# Patient Record
Sex: Female | Born: 1950 | Race: Black or African American | Hispanic: No | Marital: Single | Smoking: Never smoker
Health system: Southern US, Community
[De-identification: ages and names within clinical notes are randomized; demographics above are authoritative.]

## PROBLEM LIST (undated history)

## (undated) DIAGNOSIS — I10 Essential (primary) hypertension: Secondary | ICD-10-CM

## (undated) DIAGNOSIS — C50919 Malignant neoplasm of unspecified site of unspecified female breast: Secondary | ICD-10-CM

## (undated) HISTORY — PX: TONSILLECTOMY: SUR1361

## (undated) HISTORY — DX: Malignant neoplasm of unspecified site of unspecified female breast: C50.919

---

## 2007-02-13 ENCOUNTER — Emergency Department (HOSPITAL_COMMUNITY): Admission: EM | Admit: 2007-02-13 | Discharge: 2007-02-13 | Payer: Self-pay | Admitting: Emergency Medicine

## 2015-03-05 DIAGNOSIS — H6090 Unspecified otitis externa, unspecified ear: Secondary | ICD-10-CM | POA: Diagnosis not present

## 2015-03-05 DIAGNOSIS — Z1239 Encounter for other screening for malignant neoplasm of breast: Secondary | ICD-10-CM | POA: Diagnosis not present

## 2015-03-05 DIAGNOSIS — J069 Acute upper respiratory infection, unspecified: Secondary | ICD-10-CM | POA: Diagnosis not present

## 2015-03-11 ENCOUNTER — Other Ambulatory Visit: Payer: Self-pay | Admitting: Family Medicine

## 2015-03-11 DIAGNOSIS — Z1231 Encounter for screening mammogram for malignant neoplasm of breast: Secondary | ICD-10-CM

## 2015-03-11 DIAGNOSIS — E2839 Other primary ovarian failure: Secondary | ICD-10-CM

## 2015-03-30 ENCOUNTER — Ambulatory Visit: Payer: Self-pay

## 2015-03-30 ENCOUNTER — Other Ambulatory Visit: Payer: Self-pay

## 2015-04-08 ENCOUNTER — Ambulatory Visit
Admission: RE | Admit: 2015-04-08 | Discharge: 2015-04-08 | Disposition: A | Discharge status: Home / Self Care | Payer: Medicare Other | Source: Ambulatory Visit | Attending: Family Medicine | Admitting: Family Medicine

## 2015-04-08 DIAGNOSIS — Z1231 Encounter for screening mammogram for malignant neoplasm of breast: Secondary | ICD-10-CM

## 2015-04-15 ENCOUNTER — Inpatient Hospital Stay: Admission: RE | Admit: 2015-04-15 | Payer: Self-pay | Source: Ambulatory Visit

## 2018-02-22 ENCOUNTER — Other Ambulatory Visit (HOSPITAL_COMMUNITY): Payer: Self-pay | Admitting: Otolaryngology

## 2018-02-22 DIAGNOSIS — H7291 Unspecified perforation of tympanic membrane, right ear: Secondary | ICD-10-CM

## 2018-03-11 ENCOUNTER — Ambulatory Visit (HOSPITAL_COMMUNITY)
Admission: RE | Admit: 2018-03-11 | Discharge: 2018-03-11 | Disposition: A | Discharge status: Home / Self Care | Payer: Medicare Other | Source: Ambulatory Visit | Attending: Otolaryngology | Admitting: Otolaryngology

## 2018-03-11 DIAGNOSIS — H7291 Unspecified perforation of tympanic membrane, right ear: Secondary | ICD-10-CM | POA: Diagnosis present

## 2018-09-05 ENCOUNTER — Emergency Department (HOSPITAL_BASED_OUTPATIENT_CLINIC_OR_DEPARTMENT_OTHER)
Admission: EM | Admit: 2018-09-05 | Discharge: 2018-09-05 | Disposition: A | Discharge status: Home / Self Care | Payer: Medicare Other | Attending: Emergency Medicine | Admitting: Emergency Medicine

## 2018-09-05 ENCOUNTER — Encounter (HOSPITAL_BASED_OUTPATIENT_CLINIC_OR_DEPARTMENT_OTHER): Payer: Self-pay

## 2018-09-05 ENCOUNTER — Other Ambulatory Visit: Payer: Self-pay

## 2018-09-05 DIAGNOSIS — N611 Abscess of the breast and nipple: Secondary | ICD-10-CM | POA: Diagnosis present

## 2018-09-05 DIAGNOSIS — L02213 Cutaneous abscess of chest wall: Secondary | ICD-10-CM

## 2018-09-05 MED ORDER — SULFAMETHOXAZOLE-TRIMETHOPRIM 800-160 MG PO TABS: 1.0000 | ORAL_TABLET | Freq: Two times a day (BID) | ORAL | 0 refills | Status: AC

## 2018-09-05 MED ORDER — LIDOCAINE-EPINEPHRINE (PF) 2 %-1:200000 IJ SOLN
10.0000 mL | Freq: Once | INTRAMUSCULAR | Status: DC
  Filled 2018-09-05: qty 10

## 2018-09-05 MED ORDER — LIDOCAINE-EPINEPHRINE (PF) 2 %-1:200000 IJ SOLN
INTRAMUSCULAR | Status: AC
  Filled 2018-09-05: qty 10

## 2018-09-05 MED ORDER — IBUPROFEN 400 MG PO TABS
600.0000 mg | ORAL_TABLET | Freq: Once | ORAL | Status: AC
  Administered 2018-09-05: 600 mg via ORAL
  Filled 2018-09-05: qty 1

## 2018-09-05 NOTE — ED Triage Notes (Signed)
Pt states she noticed lump to right breast yesterday-NAD-steady gait

## 2018-09-05 NOTE — ED Provider Notes (Signed)
[facility] EMERGENCY DEPARTMENT Provider Note   CSN: [id number] Arrival date & time: 09/05/18  1847     History   Chief Complaint Chief Complaint  Patient presents with  . Breast Mass    HPI Janet Fowler is a 67 y.o. female.  The history is provided by the patient.  Abscess  Abscess location: left breast. Size:  2.5 inchest Abscess quality: fluctuance, painful and redness   Red streaking: no   Duration:  2 days Progression:  Worsening Pain details:    Quality:  Aching, hot and pressure   Severity:  Moderate Chronicity:  New Relieved by:  Nothing Worsened by:  Nothing Ineffective treatments:  Warm compresses Associated symptoms: no anorexia, no fatigue, no fever, no headaches, no nausea and no vomiting     History reviewed. No pertinent past medical history.  There are no active problems to display for this patient.   Past Surgical History:  Procedure Laterality Date  . TONSILLECTOMY       OB History   None      Home Medications    Prior to Admission medications   Medication Sig Start Date End Date Taking? Authorizing Provider  sulfamethoxazole-trimethoprim (BACTRIM DS,SEPTRA DS) 800-160 MG tablet Take 1 tablet by mouth 2 (two) times daily for 5 days. 09/05/18 09/10/18  Bonnita Hollow, MD    Family History No family history on file.  Social History Social History   Tobacco Use  . Smoking status: Never Smoker  . Smokeless tobacco: Never Used  Substance Use Topics  . Alcohol use: Never    Frequency: Never  . Drug use: Never     Allergies   Patient has no known allergies.   Review of Systems Review of Systems  Constitutional: Negative for fatigue and fever.  Gastrointestinal: Negative for anorexia, nausea and vomiting.  Neurological: Negative for headaches.     Physical Exam Updated Vital Signs BP (!) 150/92 (BP Location: Right Arm)   Pulse (!) 118   Temp 98.7 F (37.1 C) (Oral)   Resp 18   Ht 5\' 2"  (1.575 m)    Wt 93 kg   SpO2 100%   BMI 37.49 kg/m   Physical Exam  Constitutional: She appears well-developed. No distress.  HENT:  Head: Normocephalic and atraumatic.  Eyes: Pupils are equal, round, and reactive to light. Conjunctivae are normal.  Neck: JVD present.  Skin: Capillary refill takes less than 2 seconds.  Right medial breast abscess, 2.5 in. Erythematous, fluctuant     ED Treatments / Results  Labs (all labs ordered are listed, but only abnormal results are displayed) Labs Reviewed - No data to display  EKG None  Radiology No results found.  Procedures .Marland KitchenIncision and Drainage Date/Time: 09/05/2018 7:48 PM Performed by: Bonnita Hollow, MD Authorized by: Merrily Pew, MD   Consent:    Consent obtained:  Verbal   Consent given by:  Patient   Risks discussed:  Bleeding, infection, pain and incomplete drainage   Alternatives discussed:  No treatment Location:    Type:  Abscess   Size:  2.5 in   Location:  Trunk   Trunk location:  Chest Pre-procedure details:    Skin preparation:  Betadine Anesthesia (see MAR for exact dosages):    Anesthesia method:  Local infiltration   Local anesthetic:  Lidocaine 2% WITH epi Procedure type:    Complexity:  Simple Procedure details:    Needle aspiration: no     Incision types:  Stab incision   Scalpel blade:  11   Wound management:  Probed and deloculated   Drainage:  Purulent   Drainage amount:  Moderate   Wound treatment:  Wound left open   Packing materials:  None Post-procedure details:    Patient tolerance of procedure:  Tolerated well, no immediate complications   (including critical care time)  Medications Ordered in ED Medications  lidocaine-EPINEPHrine (XYLOCAINE W/EPI) 2 %-1:200000 (PF) injection (has no administration in time range)  lidocaine-EPINEPHrine (XYLOCAINE W/EPI) 2 %-1:200000 (PF) injection 10 mL (has no administration in time range)  ibuprofen (ADVIL,MOTRIN) tablet 600 mg (600 mg Oral Given  09/05/18 1954)     Initial Impression / Assessment and Plan / ED Course  I have reviewed the triage vital signs and the nursing notes.  Pertinent labs & imaging results that were available during my care of the patient were reviewed by me and considered in my medical decision making (see chart for details).     Patient presenting with simple abscess on right breast. I&D was performed on the sight. Moderate, purulent drainage was expressed. Patient sent home with 5 day course of Bactrim.   Final Clinical Impressions(s) / ED Diagnoses   Final diagnoses:  Cutaneous abscess of chest wall    ED Discharge Orders         Ordered    sulfamethoxazole-trimethoprim (BACTRIM DS,SEPTRA DS) 800-160 MG tablet  2 times daily     09/05/18 1948           Bonnita Hollow, MD 09/05/18 1955    Mesner, Corene Cornea, MD 09/06/18 [id number]    Merrily Pew, MD 09/06/18 [phone]

## 2018-09-05 NOTE — ED Notes (Signed)
md chaperoned for breast exam. Supplies gathered with bedside u/s and placed at bedside.

## 2018-09-05 NOTE — ED Notes (Signed)
ED Provider at bedside. 

## 2018-12-10 IMAGING — CT CT TEMPORAL BONES W/O CM
2 of 4 series · 13 of 40 positions shown, 16 images · non-contrast
Comparison: None.

CLINICAL DATA: Left ear drainage

EXAM:
CT TEMPORAL BONES WITHOUT CONTRAST
TECHNIQUE: Axial and coronal plane CT imaging of the petrous temporal bones was
performed with thin-collimation image reconstruction. No intravenous
contrast was administered. Multiplanar CT image reconstructions were
also generated.

[Series 3: ax soft · axial · 0.33mm/px · z∈[+1733,+1785]mm · 11 of 32 slices shown, 14 images]
[im 3/32  brain]
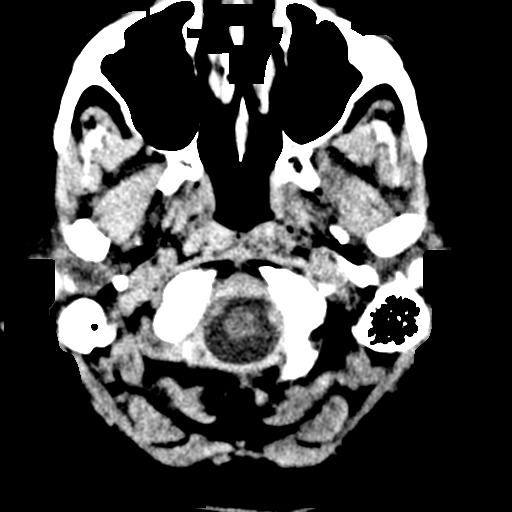
[im 3/32  bone]
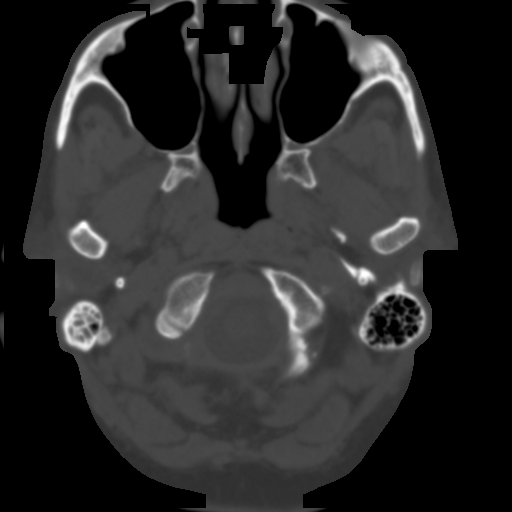
[im 5/32  bone]
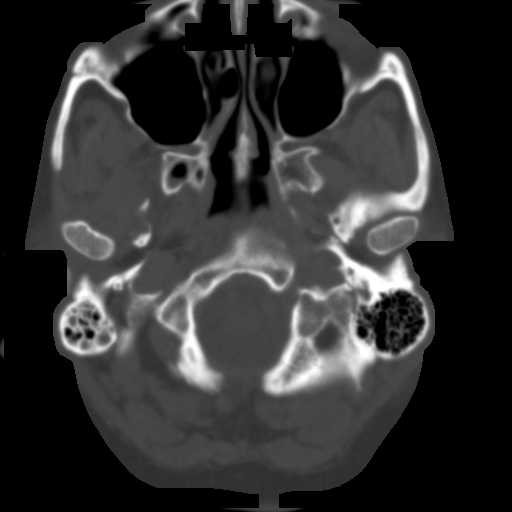
[im 8/32  bone]
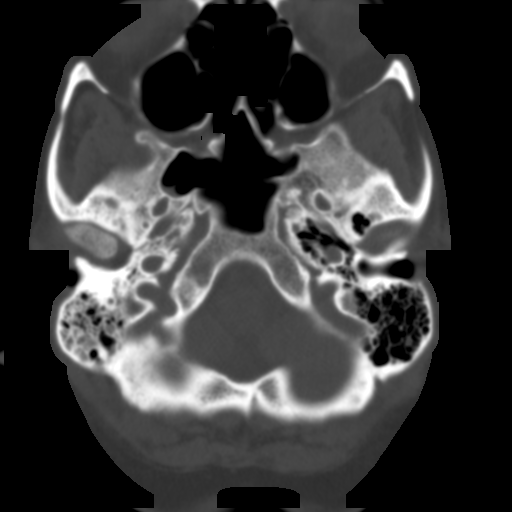
[im 10/32  bone]
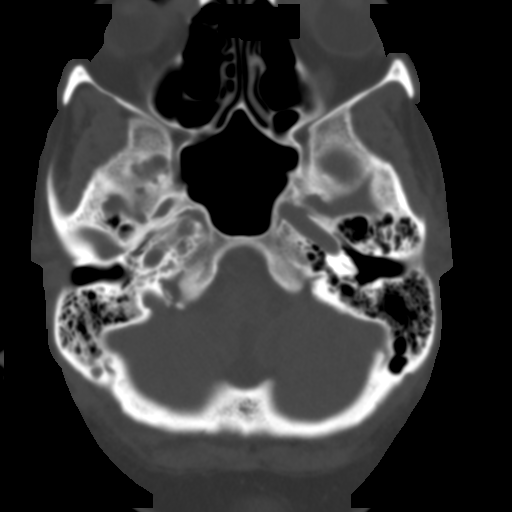
[im 13/32  brain]
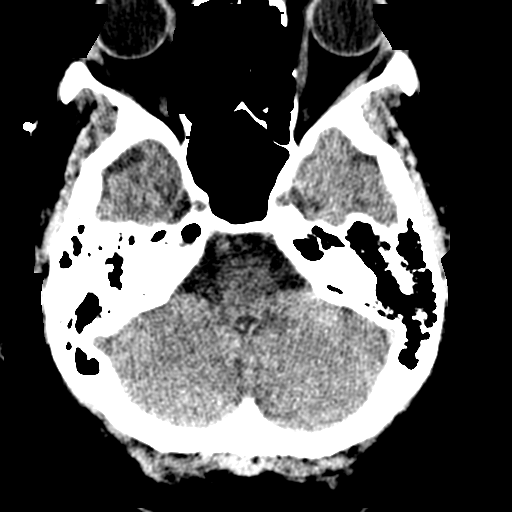
[im 13/32  bone]
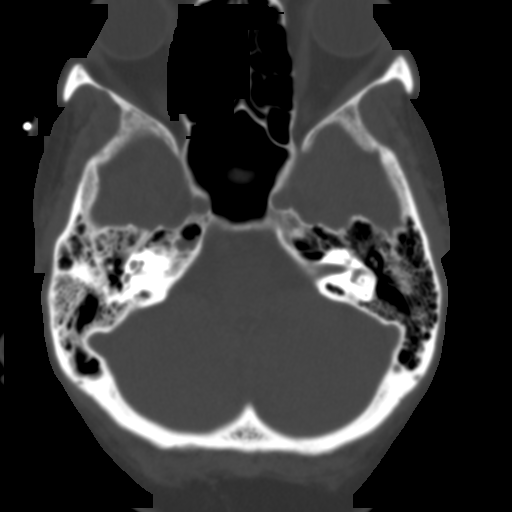
[im 17/32  bone]
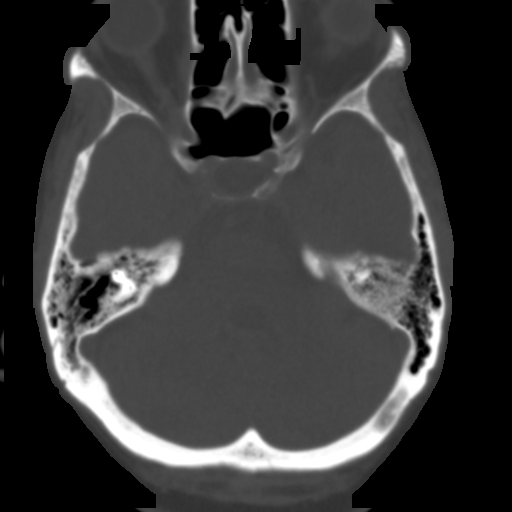
[im 19/32  bone]
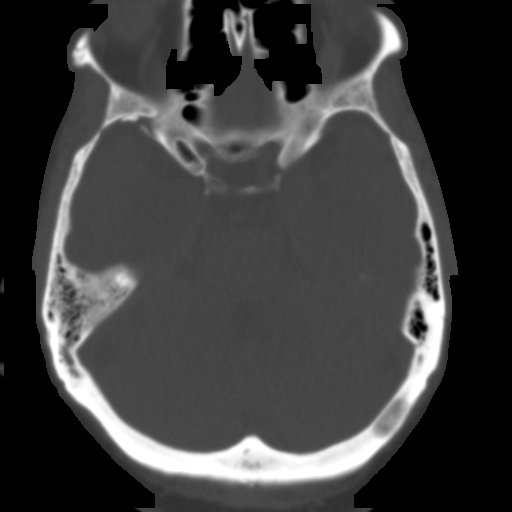
[im 22/32  bone]
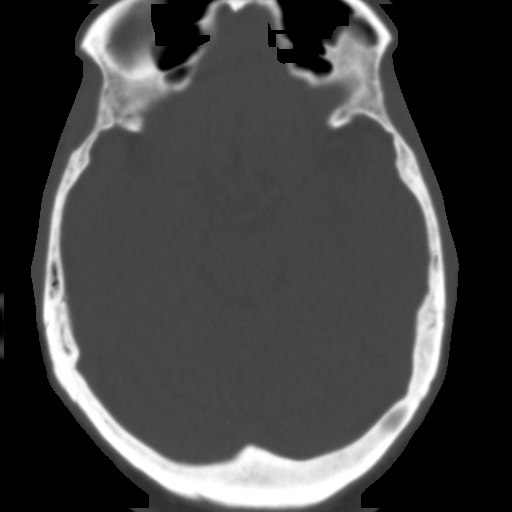
[im 24/32  brain]
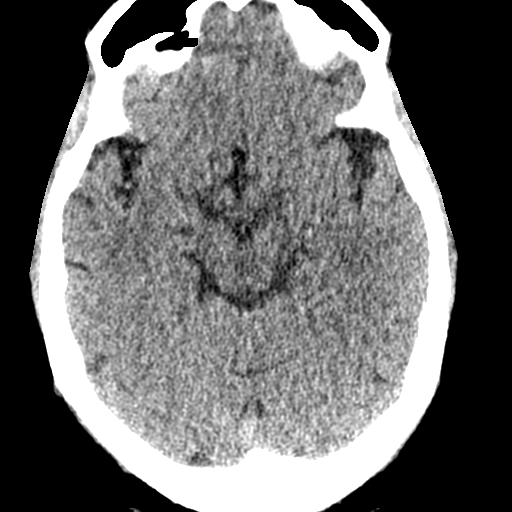
[im 24/32  bone]
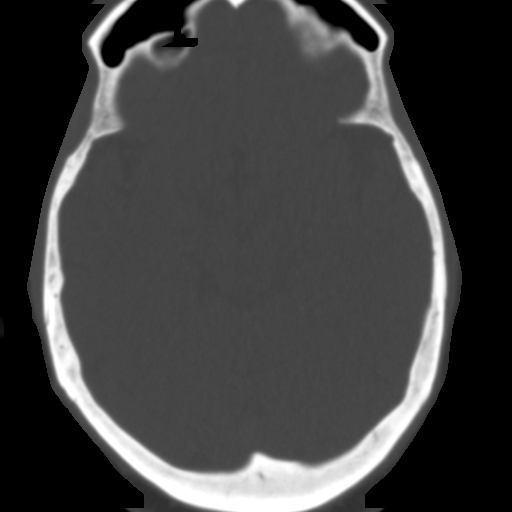
[im 27/32  bone]
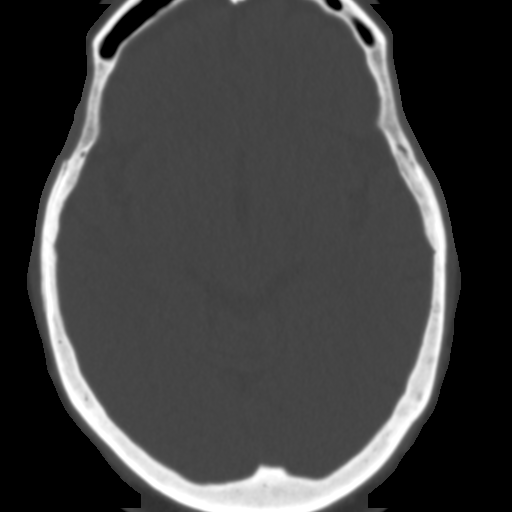
[im 29/32  bone]
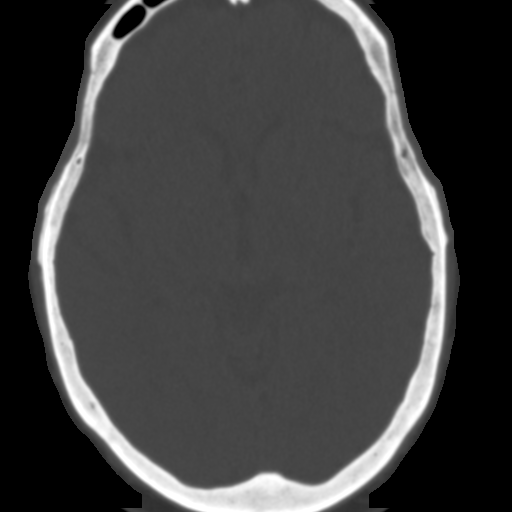

[Series 4: coronal bone. · coronal · 0.15mm/px · 2 of 181 slices shown]
[im 61/181  bone]
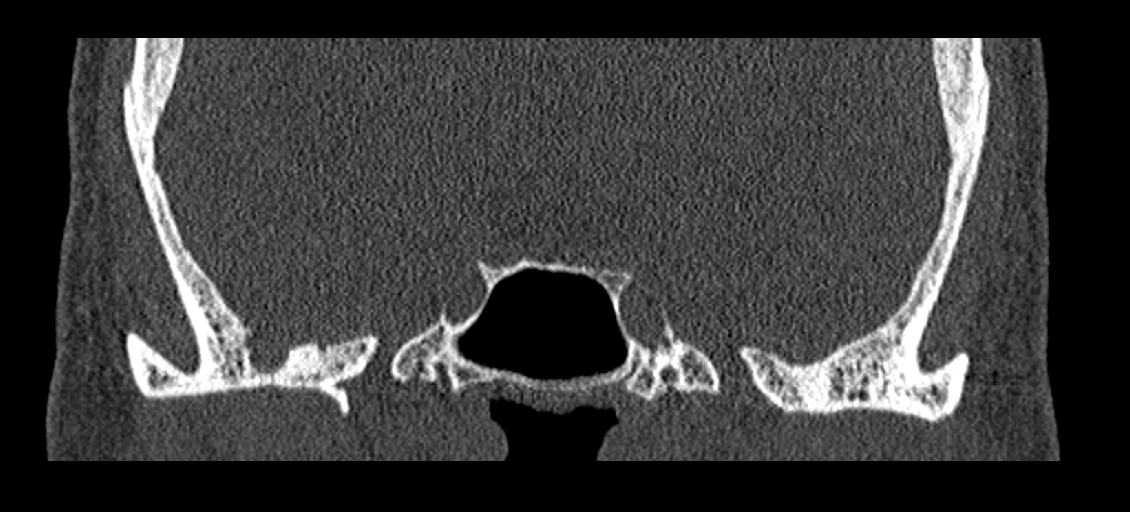
[im 121/181  bone]
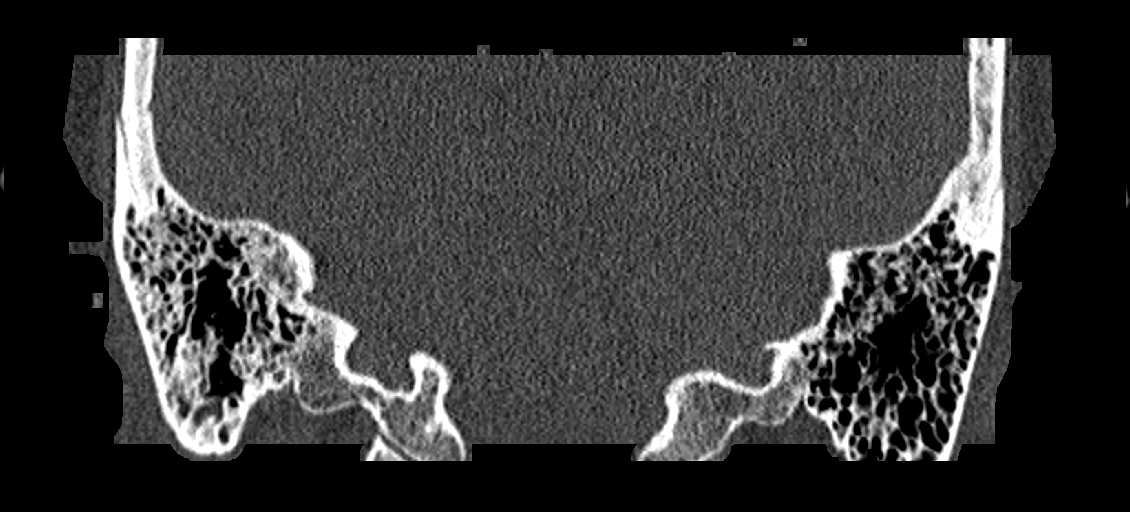

[13 of 40 positions shown; findings below may reference images not displayed]

FINDINGS: RIGHT:

--Pinna and external auditory canal: Normal.

--Ossicular chain: Normal. No erosion or dislocation.

--Tympanic membrane: Mildly thickened.

--Middle ear: Normal.

--Epitympanum: The Prussak space is minimally opacified. The scutum
is sharp. Tegmen tympani is intact.

--Cochlea, vestibule, vestibular aqueduct and semicircular canals:
Normal. No evidence of canal dehiscence or otospongiosis.

--Internal auditory canal: Normal. No widening of the porus
acusticus.

--Facial nerve: No focal abnormality along the course of the facial
nerve.

--Cerebellopontine angle: Normal.

--Petrous Apex: Normal.

--Mastoids: Partial opacification of the right mastoid air cells
without coalescence. Tegmen mastoideum remains intact.

--Carotid canal: Normal position.

LEFT:

--Pinna and external auditory canal: Normal.

--Ossicular chain: Normal. No erosion or dislocation.

--Tympanic membrane: Normal.

--Middle ear: Normal.

--Epitympanum: The Prussak space is clear. The scutum is sharp.
Tegmen tympani is intact.

--Cochlea, vestibule, vestibular aqueduct and semicircular canals:
Normal. No evidence of canal dehiscence or otospongiosis.

--Internal auditory canal: Normal. No widening of the porus
acusticus.

--Facial nerve: No focal abnormality along the course of the facial
nerve.

--Cerebellopontine angle: Normal.

--Petrous Apex: Normal.

--Mastoids: Normal.

--Carotid canal: Normal position.

OTHER:

--Visualized intracranial: Normal.

--Visualized paranasal sinuses: Trace opacification of the right
sphenoid sinus. Otherwise clear.

--Nasopharynx: Clear.

--Temporomandibular joints: Normal.

--Visualized extracranial soft tissues: Normal.
IMPRESSION: 1. Normal appearance of the left temporal bone and the middle ear
and labyrinth the structures.
2. Partial opacification of the right mastoid air cells without
coalescence or dehiscence. Mild thickening of the right tympanic
membrane.

## 2019-08-27 ENCOUNTER — Other Ambulatory Visit: Payer: Self-pay

## 2019-08-27 DIAGNOSIS — Z20822 Contact with and (suspected) exposure to covid-19: Secondary | ICD-10-CM

## 2019-08-29 LAB — NOVEL CORONAVIRUS, NAA: SARS-CoV-2, NAA: NOT DETECTED

## 2019-10-28 ENCOUNTER — Ambulatory Visit: Payer: Medicare Other

## 2019-11-06 ENCOUNTER — Ambulatory Visit: Payer: Medicare Other | Attending: Internal Medicine

## 2019-11-06 DIAGNOSIS — Z23 Encounter for immunization: Secondary | ICD-10-CM | POA: Insufficient documentation

## 2019-11-06 NOTE — Progress Notes (Signed)
   Covid-19 Vaccination Clinic  Name:  LAYTONA HJORTH    MRN: [id number] DOB: Feb 20, 1951  11/06/2019  Ms. Parlin was observed post Covid-19 immunization for 15 minutes without incidence. She was provided with Vaccine Information Sheet and instruction to access the V-Safe system.   Ms. Lenoir was instructed to call 911 with any severe reactions post vaccine: Marland Kitchen Difficulty breathing  . Swelling of your face and throat  . A fast heartbeat  . A bad rash all over your body  . Dizziness and weakness    Immunizations Administered    Name Date Dose VIS Date Route   Pfizer COVID-19 Vaccine 11/06/2019  5:34 PM 0.3 mL 09/12/2019 Intramuscular   Manufacturer: [place]   Lot: [id number]   [place]: [id number]

## 2019-11-18 ENCOUNTER — Ambulatory Visit: Payer: Medicare Other

## 2019-12-02 ENCOUNTER — Ambulatory Visit: Payer: Medicare Other | Attending: Internal Medicine

## 2019-12-02 DIAGNOSIS — Z23 Encounter for immunization: Secondary | ICD-10-CM | POA: Insufficient documentation

## 2019-12-02 NOTE — Progress Notes (Signed)
   Covid-19 Vaccination Clinic  Name:  CYNTHIE RAMSELL    MRN: [id number] DOB: 12-Feb-1951  12/02/2019  Ms. Vonnahme was observed post Covid-19 immunization for 15 minutes without incident. She was provided with Vaccine Information Sheet and instruction to access the V-Safe system.   Ms. Grinde was instructed to call 911 with any severe reactions post vaccine: Marland Kitchen Difficulty breathing  . Swelling of face and throat  . A fast heartbeat  . A bad rash all over body  . Dizziness and weakness   Immunizations Administered    Name Date Dose VIS Date Route   Pfizer COVID-19 Vaccine 12/02/2019  2:59 PM 0.3 mL 09/12/2019 Intramuscular   Manufacturer: [place]   Lot: [id number]   [place]: [id number]

## 2020-10-02 HISTORY — PX: COLONOSCOPY: SHX174

## 2020-10-04 LAB — HM COLONOSCOPY

## 2022-04-19 ENCOUNTER — Emergency Department (HOSPITAL_COMMUNITY): Payer: Medicare Other

## 2022-04-19 ENCOUNTER — Emergency Department (HOSPITAL_COMMUNITY)
Admission: EM | Admit: 2022-04-19 | Discharge: 2022-04-19 | Disposition: A | Discharge status: Home / Self Care | Payer: Medicare Other | Attending: Emergency Medicine | Admitting: Emergency Medicine

## 2022-04-19 ENCOUNTER — Other Ambulatory Visit: Payer: Self-pay

## 2022-04-19 ENCOUNTER — Encounter (HOSPITAL_COMMUNITY): Payer: Self-pay | Admitting: Emergency Medicine

## 2022-04-19 DIAGNOSIS — M79675 Pain in left toe(s): Secondary | ICD-10-CM | POA: Insufficient documentation

## 2022-04-19 DIAGNOSIS — I6782 Cerebral ischemia: Secondary | ICD-10-CM | POA: Diagnosis not present

## 2022-04-19 DIAGNOSIS — Y92410 Unspecified street and highway as the place of occurrence of the external cause: Secondary | ICD-10-CM | POA: Diagnosis not present

## 2022-04-19 DIAGNOSIS — S20219A Contusion of unspecified front wall of thorax, initial encounter: Secondary | ICD-10-CM | POA: Diagnosis present

## 2022-04-19 DIAGNOSIS — D259 Leiomyoma of uterus, unspecified: Secondary | ICD-10-CM | POA: Diagnosis not present

## 2022-04-19 LAB — BASIC METABOLIC PANEL
Anion gap: 10 (ref 5–15)
BUN: 11 mg/dL (ref 8–23)
CO2: 20 mmol/L — ABNORMAL LOW (ref 22–32)
Calcium: 9.4 mg/dL (ref 8.9–10.3)
Chloride: 108 mmol/L (ref 98–111)
Creatinine, Ser: 1.18 mg/dL — ABNORMAL HIGH (ref 0.44–1.00)
GFR, Estimated: 49 mL/min — ABNORMAL LOW (ref >60)
Glucose, Bld: 103 mg/dL — ABNORMAL HIGH (ref 70–99)
Potassium: 3.6 mmol/L (ref 3.5–5.1)
Sodium: 138 mmol/L (ref 135–145)

## 2022-04-19 LAB — CBC
HCT: 41.5 % (ref 36.0–46.0)
Hemoglobin: 13.9 g/dL (ref 12.0–15.0)
MCH: 29.3 pg (ref 26.0–34.0)
MCHC: 33.5 g/dL (ref 30.0–36.0)
MCV: 87.6 fL (ref 80.0–100.0)
Platelets: 268 10*3/uL (ref 150–400)
RBC: 4.74 MIL/uL (ref 3.87–5.11)
RDW: 12.4 % (ref 11.5–15.5)
WBC: 6.4 10*3/uL (ref 4.0–10.5)
nRBC: 0 % (ref 0.0–0.2)

## 2022-04-19 LAB — TROPONIN I (HIGH SENSITIVITY)
Troponin I (High Sensitivity): 3 ng/L (ref <18)
Troponin I (High Sensitivity): 4 ng/L (ref <18)

## 2022-04-19 MED ORDER — IOHEXOL 300 MG/ML  SOLN
80.0000 mL | Freq: Once | INTRAMUSCULAR | Status: AC | PRN
  Administered 2022-04-19: 80 mL via INTRAVENOUS

## 2022-04-19 MED ORDER — IBUPROFEN 400 MG PO TABS
600.0000 mg | ORAL_TABLET | Freq: Once | ORAL | Status: AC
  Administered 2022-04-19: 600 mg via ORAL
  Filled 2022-04-19: qty 1

## 2022-04-19 NOTE — ED Notes (Signed)
Patient transported to CT 

## 2022-04-19 NOTE — ED Notes (Signed)
All discharge instructions reviewed with patient including follow up care. Patient verbalized understanding of same and had no other questions. Patient stable at time of discharge and wheel chair was provided to patient prior to leaving the department.
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
## 2022-04-19 NOTE — Discharge Instructions (Signed)
Return for any problem.  As discussed, follow up closely with your regular care provider regarding possible thyroid nodules and possible AVM in your brain.
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
## 2022-05-23 ENCOUNTER — Encounter: Payer: Self-pay | Admitting: Internal Medicine

## 2022-05-23 ENCOUNTER — Ambulatory Visit: Payer: Medicare Other | Attending: Internal Medicine | Admitting: Internal Medicine

## 2022-05-23 VITALS — BP 170/90 | HR 81 | Ht 62.0 in | Wt 206.8 lb

## 2022-05-23 DIAGNOSIS — N6312 Unspecified lump in the right breast, upper inner quadrant: Secondary | ICD-10-CM | POA: Diagnosis not present

## 2022-05-23 DIAGNOSIS — Q282 Arteriovenous malformation of cerebral vessels: Secondary | ICD-10-CM

## 2022-05-23 DIAGNOSIS — E079 Disorder of thyroid, unspecified: Secondary | ICD-10-CM | POA: Diagnosis not present

## 2022-05-23 DIAGNOSIS — I10 Essential (primary) hypertension: Secondary | ICD-10-CM | POA: Diagnosis not present

## 2022-05-23 DIAGNOSIS — Z7689 Persons encountering health services in other specified circumstances: Secondary | ICD-10-CM | POA: Diagnosis not present

## 2022-05-23 DIAGNOSIS — M79675 Pain in left toe(s): Secondary | ICD-10-CM

## 2022-05-23 NOTE — Patient Instructions (Signed)
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
## 2022-05-24 ENCOUNTER — Ambulatory Visit
Admission: RE | Admit: 2022-05-24 | Discharge: 2022-05-24 | Disposition: A | Discharge status: Home / Self Care | Payer: Medicare Other | Source: Ambulatory Visit | Attending: Internal Medicine | Admitting: Internal Medicine

## 2022-05-24 DIAGNOSIS — E079 Disorder of thyroid, unspecified: Secondary | ICD-10-CM

## 2022-05-24 LAB — COMPREHENSIVE METABOLIC PANEL
ALT: 9 IU/L (ref 0–32)
AST: 15 IU/L (ref 0–40)
Albumin/Globulin Ratio: 1.6 (ref 1.2–2.2)
Albumin: 4.7 g/dL (ref 3.8–4.8)
Alkaline Phosphatase: 90 IU/L (ref 44–121)
BUN/Creatinine Ratio: 9 — ABNORMAL LOW (ref 12–28)
BUN: 8 mg/dL (ref 8–27)
Bilirubin Total: 0.4 mg/dL (ref 0.0–1.2)
CO2: 21 mmol/L (ref 20–29)
Calcium: 9.6 mg/dL (ref 8.7–10.3)
Chloride: 104 mmol/L (ref 96–106)
Creatinine, Ser: 0.93 mg/dL (ref 0.57–1.00)
Globulin, Total: 2.9 g/dL (ref 1.5–4.5)
Glucose: 89 mg/dL (ref 70–99)
Potassium: 4.2 mmol/L (ref 3.5–5.2)
Sodium: 141 mmol/L (ref 134–144)
Total Protein: 7.6 g/dL (ref 6.0–8.5)
eGFR: 65 mL/min/{1.73_m2} (ref >59)

## 2022-05-24 LAB — LIPID PANEL
Chol/HDL Ratio: 2.6 ratio (ref 0.0–4.4)
Cholesterol, Total: 226 mg/dL — ABNORMAL HIGH (ref 100–199)
HDL: 86 mg/dL (ref >39)
LDL Chol Calc (NIH): 127 mg/dL — ABNORMAL HIGH (ref 0–99)
Triglycerides: 76 mg/dL (ref 0–149)
VLDL Cholesterol Cal: 13 mg/dL (ref 5–40)

## 2022-05-24 LAB — CBC
Hematocrit: 42.7 % (ref 34.0–46.6)
Hemoglobin: 14.3 g/dL (ref 11.1–15.9)
MCH: 29.6 pg (ref 26.6–33.0)
MCHC: 33.5 g/dL (ref 31.5–35.7)
MCV: 88 fL (ref 79–97)
Platelets: 260 10*3/uL (ref 150–450)
RBC: 4.83 x10E6/uL (ref 3.77–5.28)
RDW: 12.6 % (ref 11.7–15.4)
WBC: 4.5 10*3/uL (ref 3.4–10.8)

## 2022-05-24 LAB — TSH+T4F+T3FREE
Free T4: 1.41 ng/dL (ref 0.82–1.77)
T3, Free: 3 pg/mL (ref 2.0–4.4)
TSH: 1.9 u[IU]/mL (ref 0.450–4.500)

## 2022-05-27 ENCOUNTER — Telehealth: Payer: Self-pay | Admitting: Internal Medicine

## 2022-05-27 NOTE — Telephone Encounter (Signed)
Phone call placed to patient this evening to go over the results of her thyroid ultrasound.  I left a message on her voicemail informing her of who I am and my reason for calling.  Informed that I will try to reach her again tomorrow.

## 2022-05-29 ENCOUNTER — Telehealth: Payer: Self-pay | Admitting: Internal Medicine

## 2022-05-29 DIAGNOSIS — E041 Nontoxic single thyroid nodule: Secondary | ICD-10-CM

## 2022-05-29 NOTE — Telephone Encounter (Signed)
PC placed to pt this a.m.  Pt informed that her thyroid US showed several nodules but 2 were suspicious enough that the radiologist recommended FNB -1 on each side.  I explained to her what the FNA entailed.  The patient expressed understanding and is willing to proceed with having the FNA done of the 2 suspicious nodules.  I told her that I will refer her to the interventional radiologist and she will receive a call with the appointment date and time.

## 2022-05-30 ENCOUNTER — Telehealth: Payer: Self-pay | Admitting: Internal Medicine

## 2022-05-30 ENCOUNTER — Other Ambulatory Visit: Payer: Self-pay | Admitting: Internal Medicine

## 2022-05-30 DIAGNOSIS — E041 Nontoxic single thyroid nodule: Secondary | ICD-10-CM

## 2022-05-30 NOTE — Telephone Encounter (Signed)
Copied from [place] [id number]. Topic: General - Call Back - No Documentation >> May 30, 2022 11:21 AM Leitha Schuller wrote: Pt returning call from 8-29  Pe[facility] fu w. pt

## 2022-05-30 NOTE — Telephone Encounter (Signed)
Noted  

## 2022-05-31 NOTE — Telephone Encounter (Signed)
Update/FYI: Called [place] spoke w/ Clarise Cruz, she states that pt appt for biopsy is scheduled for Fri 09/01'@2'$ :45.------DD,RMA

## 2022-06-02 ENCOUNTER — Ambulatory Visit
Admission: RE | Admit: 2022-06-02 | Discharge: 2022-06-02 | Disposition: A | Discharge status: Home / Self Care | Payer: Medicare Other | Source: Ambulatory Visit | Attending: Internal Medicine | Admitting: Internal Medicine

## 2022-06-02 ENCOUNTER — Other Ambulatory Visit (HOSPITAL_COMMUNITY)
Admission: RE | Admit: 2022-06-02 | Discharge: 2022-06-02 | Disposition: A | Discharge status: Home / Self Care | Payer: Medicare Other | Source: Ambulatory Visit | Attending: Internal Medicine | Admitting: Internal Medicine

## 2022-06-02 DIAGNOSIS — E041 Nontoxic single thyroid nodule: Secondary | ICD-10-CM | POA: Diagnosis present

## 2022-06-06 ENCOUNTER — Telehealth: Payer: Medicare Other | Admitting: Internal Medicine

## 2022-06-06 DIAGNOSIS — E041 Nontoxic single thyroid nodule: Secondary | ICD-10-CM

## 2022-06-06 NOTE — Telephone Encounter (Signed)
Caller states pt came in on 9-1 to have a Thyroid biopsy but could not tolerate the procedure  Caller states pt need to have biopsy done at [facility] under anesthesia

## 2022-06-07 LAB — CYTOLOGY - NON PAP

## 2022-06-07 NOTE — Telephone Encounter (Signed)
FYI

## 2022-06-08 ENCOUNTER — Ambulatory Visit: Payer: Medicare Other | Attending: Internal Medicine | Admitting: Pharmacist

## 2022-06-08 VITALS — BP 154/85 | HR 76

## 2022-06-08 DIAGNOSIS — I10 Essential (primary) hypertension: Secondary | ICD-10-CM

## 2022-06-08 MED ORDER — AMLODIPINE BESYLATE 5 MG PO TABS: 5.0000 mg | ORAL_TABLET | Freq: Every day | ORAL | 1 refills | Status: DC

## 2022-06-08 NOTE — Progress Notes (Signed)
   S:     No chief complaint on file.  Janet Fowler is a 71 y.o. female who presents for hypertension evaluation, education, and management.  PMH is significant for HTN.  Patient was referred and last seen by Primary Care Provider, Dr. Wynetta Emery, on 05/23/2022.   At last visit, BP was 170/90, patient was told her BP was elevated in the past, BP medication was not started but recommended to be initiated if BP still elevated next visit with pharmacist.   Today, patient arrives in good spirits and presents without assistance. Denies dizziness, headache, blurred vision, swelling.   Patient reports hypertension was diagnosed in 05/23/2022.   Family/Social history:  Fhx: stroke, HTN. Tobacco: Never smoker  Reported home BP readings: 130-150s/70s-80s  Patient reported dietary habits:  -Eats at least 2 meals a day. Patient is adherent with salt restrictions since last visit.  Patient-reported exercise habits:  -Patient doesn't have an exercising regimen but plans to start having daily walks.  O:  Vitals:   06/08/22 1113  BP: (!) 154/85  Pulse: 76   Last 3 Office BP readings: BP Readings from Last 3 Encounters:  06/08/22 (!) 154/85  05/23/22 (!) 170/90  04/19/22 (!) 176/80   BMET    Component Value Date/Time   NA 141 05/23/2022 1121   K 4.2 05/23/2022 1121   CL 104 05/23/2022 1121   CO2 21 05/23/2022 1121   GLUCOSE 89 05/23/2022 1121   GLUCOSE 103 (H) 04/19/2022 1750   BUN 8 05/23/2022 1121   CREATININE 0.93 05/23/2022 1121   CALCIUM 9.6 05/23/2022 1121   GFRNONAA 49 (L) 04/19/2022 1750   Renal function: CrCl cannot be calculated (Unknown ideal weight.).  Clinical ASCVD: No  The 10-year ASCVD risk score (Arnett DK, et al., 2019) is: 21.7%   Values used to calculate the score:     Age: 68 years     Sex: Female     Is Non-Hispanic African American: Yes     Diabetic: No     Tobacco smoker: No     Systolic Blood Pressure: 212 mmHg     Is BP treated: Yes     HDL  Cholesterol: 86 mg/dL     Total Cholesterol: 226 mg/dL  A/P: Hypertension newly diagnosed currently uncontrolled. BP goal < 130/80 mmHg.  -Started Amlodipine 5 mg daily.  -Patient educated on purpose, proper use, and potential adverse effects of Amlodipine.  -F/u labs ordered - none -Counseled on lifestyle modifications for blood pressure control including reduced dietary sodium, increased exercise, adequate sleep. -Encouraged patient to check BP at home and bring log of readings to next visit. Counseled on proper use of home BP cuff.   Results reviewed and written information provided.    Written patient instructions provided. Patient verbalized understanding of treatment plan.  Total time in face to face counseling 30 minutes.    Follow-up:  -Pharmacist in one month.  Patient seen with: Deirdre Evener, PharmD Candidate  [employer]   Pharmacist: Benard Halsted, PharmD, [place], [place] [phone]

## 2022-06-09 NOTE — Telephone Encounter (Signed)
I called and spoke with patient yesterday regarding the results of thyroid biopsy.  I read the note of the radiologist.  She needed to have 2 nodules biopsied but they were only able to do due to patient discomfort with the procedure.  They recommend repeat biopsy with sedation to be done at [place] patient that the pathology from the fine-needle biopsy was read as scant follicular epithelium that was satisfactory but limited for evaluation. He needs to have fine-needle biopsy of nodule #3 on the right and nodule #7 on the left.  Patient agreeable for me to submit the referral for her to have this done at [facility] by interventional radiology with sedation.

## 2022-06-14 ENCOUNTER — Telehealth: Payer: Self-pay

## 2022-06-14 ENCOUNTER — Encounter: Payer: Self-pay | Admitting: Physician Assistant

## 2022-06-14 ENCOUNTER — Ambulatory Visit: Payer: Medicare Other | Attending: Physician Assistant | Admitting: Physician Assistant

## 2022-06-14 VITALS — BP 134/70 | HR 64 | Temp 98.0°F | Ht 62.0 in | Wt 200.6 lb

## 2022-06-14 DIAGNOSIS — I10 Essential (primary) hypertension: Secondary | ICD-10-CM | POA: Diagnosis not present

## 2022-06-14 NOTE — Telephone Encounter (Signed)
Called spoke w/ IR scheduling they trans. Call. LVM for scheduler to RTC. To schedule appt. Will try again today.----DD,RMA

## 2022-06-14 NOTE — Telephone Encounter (Signed)
Pt informed that order is in process for Radiology. Pt also informed that message sent to pcp to f/u on referral status. Pt expressed understanding.----DD,RMA   (See message in pt chart sent to pcp preferred next steps in referral process.)

## 2022-06-14 NOTE — Telephone Encounter (Signed)
Kenney Houseman w/ [facility]- IR Radiology RTC informing that she reviewed order. However, she states that order was sent to [facility] imaging for Biopsy. Kenney Houseman states because referral was sent to them she is unable to schedule pt at [facility] for IR. Will a new order be placed to schedule pt for IR radiology or will pt need to keep referral w/ [facility]? Please advise.----DD,RMA

## 2022-06-14 NOTE — Telephone Encounter (Signed)
RTC spoke w/ Pt care coordinator rep. She states that scheduling coordinator for Radiology was busy at the moment. But she states she sent message for her to RTC call at direct ext. Informed will await call. ------DD,RMA

## 2022-06-14 NOTE — Patient Instructions (Signed)
Average goal on blood pressures should be <130/80

## 2022-06-14 NOTE — Addendum Note (Signed)
Addended by: Karle Plumber B on: 06/14/2022 03:22 PM   Modules accepted: Orders

## 2022-06-14 NOTE — Progress Notes (Signed)
Patient ID: Janet Fowler, female   DOB: 1951-06-23, 71 y.o.   MRN: [id number]   Janet Fowler, is a 71 y.o. female  YDX:[id number]  HMC:[id number]  DOB - 12/10/1950  Chief Complaint  Patient presents with   Follow-up       Subjective:   Janet Fowler is a 71 y.o. female here today for  recheck of BP.  Started amlodipine on 9/7.  Tolerating it well.  BP daily since then in order: 147/108 142/79 114/72 135/87 130/82  No HA/leg swelling/dizziness  She is awaiting scheduling on thyroid nodule biopsy  No problems updated.  ALLERGIES: No Known Allergies  PAST MEDICAL HISTORY: History reviewed. No pertinent past medical history.  MEDICATIONS AT HOME: Prior to Admission medications   Medication Sig Start Date End Date Taking? Authorizing Provider  amLODipine (NORVASC) 5 MG tablet Take 1 tablet (5 mg total) by mouth daily. 06/08/22   Ladell Pier, MD    ROS: Neg HEENT Neg resp Neg cardiac Neg GI Neg GU Neg MS Neg psych Neg neuro  Objective:   Vitals:   06/14/22 1143 06/14/22 1151  BP: (!) 159/90 134/70  Pulse: 64   Temp: 98 F (36.7 C)   SpO2: 100%   Weight: 200 lb 9.6 oz (91 kg)   Height: '5\' 2"'$  (1.575 m)    Exam General appearance : Awake, alert, not in any distress. Speech Clear. Not toxic looking HEENT: Atraumatic and Normocephalic Chest: Good air entry bilaterally, CTAB.  No rales/rhonchi/wheezing CVS: S1 S2 regular, no murmurs.  Extremities: B/L Lower Ext shows no edema, both legs are warm to touch Neurology: Awake alert, and oriented X 3, CN II-XII intact, Non focal Skin: No Rash  Data Review No results found for: "HGBA1C"  Assessment & Plan   1. Essential hypertension Much improved, trending toward goal.  Continue amlodipine '5mg'$  and daily BP checks with overall goal<130/85.  She is also following a low sodium diet.   - Basic metabolic panel    Return in about 4 months (around 10/14/2022) for PCP for chronic conditions.  The  patient was given clear instructions to go to ER or return to medical center if symptoms don't improve, worsen or new problems develop. The patient verbalized understanding. The patient was told to call to get lab results if they haven't heard anything in the next week.      Freeman Caldron, PA-C [facility] [place], [place]   06/14/2022, 12:16 PM

## 2022-06-14 NOTE — Progress Notes (Signed)
Pt states no major concerns

## 2022-06-15 LAB — BASIC METABOLIC PANEL
BUN/Creatinine Ratio: 10 — ABNORMAL LOW (ref 12–28)
BUN: 10 mg/dL (ref 8–27)
CO2: 20 mmol/L (ref 20–29)
Calcium: 10.1 mg/dL (ref 8.7–10.3)
Chloride: 104 mmol/L (ref 96–106)
Creatinine, Ser: 0.97 mg/dL (ref 0.57–1.00)
Glucose: 88 mg/dL (ref 70–99)
Potassium: 4.3 mmol/L (ref 3.5–5.2)
Sodium: 143 mmol/L (ref 134–144)
eGFR: 62 mL/min/{1.73_m2} (ref >59)

## 2022-06-27 ENCOUNTER — Ambulatory Visit (HOSPITAL_COMMUNITY)
Admission: RE | Admit: 2022-06-27 | Discharge: 2022-06-27 | Disposition: A | Discharge status: Home / Self Care | Payer: Medicare Other | Source: Ambulatory Visit | Attending: Internal Medicine | Admitting: Internal Medicine

## 2022-06-27 ENCOUNTER — Other Ambulatory Visit: Payer: Self-pay

## 2022-06-27 DIAGNOSIS — E041 Nontoxic single thyroid nodule: Secondary | ICD-10-CM | POA: Insufficient documentation

## 2022-06-27 DIAGNOSIS — I10 Essential (primary) hypertension: Secondary | ICD-10-CM | POA: Insufficient documentation

## 2022-06-27 MED ORDER — FENTANYL CITRATE (PF) 100 MCG/2ML IJ SOLN
INTRAMUSCULAR | Status: AC
  Filled 2022-06-27: qty 2

## 2022-06-27 MED ORDER — MIDAZOLAM HCL 2 MG/2ML IJ SOLN
INTRAMUSCULAR | Status: AC
  Filled 2022-06-27: qty 2

## 2022-06-27 MED ORDER — LIDOCAINE HCL (PF) 1 % IJ SOLN
INTRAMUSCULAR | Status: AC
  Filled 2022-06-27: qty 30

## 2022-06-27 MED ORDER — FENTANYL CITRATE (PF) 100 MCG/2ML IJ SOLN
INTRAMUSCULAR | Status: AC | PRN
  Administered 2022-06-27: 25 ug via INTRAVENOUS
  Administered 2022-06-27: 50 ug via INTRAVENOUS

## 2022-06-27 MED ORDER — MIDAZOLAM HCL 2 MG/2ML IJ SOLN
INTRAMUSCULAR | Status: AC | PRN
  Administered 2022-06-27: .5 mg via INTRAVENOUS
  Administered 2022-06-27: 1 mg via INTRAVENOUS

## 2022-06-27 NOTE — Consult Note (Signed)
Chief Complaint: Patient was seen in consultation today for ultrasound-guided thyroid biopsy with sedation at the request of Karle Plumber B  Referring Physician(s): Ladell Pier  Supervising Physician: Mir, Sharen Heck  Patient Status: [facility] - Out-pt  History of Present Illness: Janet Fowler is a 71 y.o. female with PMH of essential hypertension being seen today for ultrasound-guided thyroid biopsy with moderate sedation. Patient underwent thyroid ultrasound on 05/26/22 which revealed multiple thyroid nodules, two of which (Nodule #3 and nodule #6 in the right and left inferior thyroid lobes respectively) met criteria for FNA biopsy. Ultrasound-guided biopsy was performed at [facility] on 06/02/22, but this exam was halted after 1 needle biopsy sample of nodule #3 due to patient discomfort. This sample was non-diagnostic. Further biopsy was requested to be performed under sedation so that adequate samples could be taken.   No past medical history on file.  Past Surgical History:  Procedure Laterality Date   TONSILLECTOMY      Allergies: Patient has no known allergies.  Medications: Prior to Admission medications   Medication Sig Start Date End Date Taking? Authorizing Provider  acetaminophen (TYLENOL) 325 MG tablet Take 325 mg by mouth every 6 (six) hours as needed (pain.).   Yes [provider]  amLODipine (NORVASC) 5 MG tablet Take 1 tablet (5 mg total) by mouth daily. 06/08/22  Yes Ladell Pier, MD     Family History  Problem Relation Age of Onset   Hypertension Mother    Stroke Father    Hypertension Father     Social History   Socioeconomic History   Marital status: Single    Spouse name: Not on file   Number of children: 0   Years of education: Not on file   Highest education level: Some college, no degree  Occupational History   Occupation: Retired Network engineer  Tobacco Use   Smoking status: Never   Smokeless tobacco: Never  Vaping Use    Vaping Use: Never used  Substance and Sexual Activity   Alcohol use: Never   Drug use: Never   Sexual activity: Not on file  Other Topics Concern   Not on file  Social History Narrative   Not on file   Social Determinants of Health   Financial Resource Strain: Not on file  Food Insecurity: Not on file  Transportation Needs: Not on file  Physical Activity: Not on file  Stress: Not on file  Social Connections: Not on file     Review of Systems: A 12 point ROS discussed and pertinent positives are indicated in the HPI above.  All other systems are negative.  Review of Systems  Constitutional:  Negative for chills and fever.  Respiratory:  Negative for chest tightness and shortness of breath.   Cardiovascular:  Negative for chest pain.  Gastrointestinal:  Negative for abdominal pain, diarrhea, nausea and vomiting.  Neurological:  Negative for dizziness and headaches.  Psychiatric/Behavioral:  Negative for confusion.     Vital Signs: BP (!) 151/71   Pulse 74   Temp 98.2 F (36.8 C) (Oral)   Resp 14   Ht _0  (1.575 m)   Wt 197 lb (89.4 kg)   SpO2 98%   BMI 36.03 kg/m    Physical Exam Constitutional:      General: She is not in acute distress.    Appearance: She is obese.  HENT:     Mouth/Throat:     Mouth: Mucous membranes are moist.  Cardiovascular:  Rate and Rhythm: Normal rate and regular rhythm.     Pulses: Normal pulses.     Heart sounds: Normal heart sounds.  Pulmonary:     Effort: Pulmonary effort is normal.     Breath sounds: Normal breath sounds.  Abdominal:     General: Bowel sounds are normal.     Palpations: Abdomen is soft.     Tenderness: There is no abdominal tenderness.  Skin:    General: Skin is warm and dry.  Neurological:     Mental Status: She is alert and oriented to person, place, and time.  Psychiatric:        Mood and Affect: Mood normal.        Behavior: Behavior normal.        Thought Content: Thought content normal.         Judgment: Judgment normal.     Imaging: [place] FNA BX THYROID 1ST LESION AFIRMA  Result Date: 06/02/2022 INDICATION: Indeterminate thyroid nodules. EXAM: ULTRASOUND GUIDED FINE NEEDLE ASPIRATION OF INDETERMINATE RIGHT THYROID NODULE COMPARISON:  US Thyroid 05/26/22 MEDICATIONS: None COMPLICATIONS: None immediate. TECHNIQUE: Informed written consent was obtained from the patient after a discussion of the risks, benefits and alternatives to treatment. Questions regarding the procedure were encouraged and answered. A timeout was performed prior to the initiation of the procedure. Patient was scheduled for biopsy of 2 nodules based on the previous thyroid ultrasound. These nodules were previously labeled as nodules #3 and #6. Nodule #3 in the right thyroid lobe do not significantly change from the pre-procedural imaging. Nodule #6 has changed with decreased size and increased cystic component. Nodule #7 in the left inferior thyroid lobe also meets criteria for biopsy. The procedure was planned. The neck was prepped in the usual sterile fashion, and a sterile drape was applied covering the operative field. A timeout was performed prior to the initiation of the procedure. Local anesthesia was provided with 1% lidocaine. Under direct ultrasound guidance, 1 FNA biopsy was performed of the right inferior nodule with a 25 gauge needle. Ultrasound images were saved for procedural documentation purposes. The sample was prepared and submitted to pathology. Patient did not tolerate procedure well due to expressed discomfort and pain. Procedure was aborted per patient request. Dressing was placed. No apparent post-procedural complication. FINDINGS: Nodule reference number based on prior diagnostic ultrasound: 3 Maximum size: 2.2cm Location: Right; Inferior ACR TI-RADS risk category: TR4 (4-6 points) Reason for biopsy: meets ACR TI-RADS criteria Ultrasound imaging confirms appropriate placement of the needle within the thyroid  nodule. Nodule in the left thyroid lobe that was previously labeled as nodule #6 has changed in size and morphology. This is a mixed cystic and solid nodule now and the maximum dimension is 1.0 cm. This nodule no longer meets criteria for biopsy. IMPRESSION: 1. Ultrasound-guided fine-needle aspiration of the right thyroid nodule, nodule #3. However, only 1 fine-needle aspiration was obtained due to patient discomfort and this may be inadequate. 2. Patient will be scheduled for repeat biopsy at [facility] with sedation. If the cytology is adequate from today's biopsy, we will not repeat biopsy of nodule #3. Nodule #6 in the left thyroid lobe no longer meets criteria for biopsy as described above. Nodule #7 in the left thyroid lobe does meet criteria for biopsy and will plan to biopsy this nodule at the hospital with sedation. Performed and read by Pasty Spillers, PA-C Electronically Signed   By: Markus Daft M.D.   On: 06/02/2022 16:36  Labs:  CBC: Recent Labs    04/19/22 1750 05/23/22 1121  WBC 6.4 4.5  HGB 13.9 14.3  HCT 41.5 42.7  PLT 268 260    COAGS: No results for input(s): "INR", "APTT" in the last 8760 hours.  BMP: Recent Labs    04/19/22 1750 05/23/22 1121 06/14/22 1207  NA 138 141 143  K 3.6 4.2 4.3  CL 108 104 104  CO2 20* 21 20  GLUCOSE 103* 89 88  BUN _0 CALCIUM 9.4 9.6 10.1  CREATININE 1.18* 0.93 0.97  GFRNONAA 49*  --   --     LIVER FUNCTION TESTS: Recent Labs    05/23/22 1121  BILITOT 0.4  AST 15  ALT 9  ALKPHOS 90  PROT 7.6  ALBUMIN 4.7    TUMOR MARKERS: No results for input(s): "AFPTM", "CEA", "CA199", "CHROMGRNA" in the last 8760 hours.  Assessment and Plan: Janet Fowler is a 71 y.o. female with PMH of essential hypertension being seen today for ultrasound-guided thyroid biopsy with moderate sedation. The patient has two nodules, Nodules #3 in the left inferior thyroid and #6 in the right inferior thyroid, which meet TI-RADS  criteria for biopsy. Case has been reviewed and is approved to proceed with Dr Mir on 06/27/22 with sedation.   Thank you for this interesting consult.  I greatly enjoyed meeting Janet Fowler and look forward to participating in their care.  A copy of this report was sent to the requesting provider on this date.  Electronically Signed: Lura Em, PA-C 06/27/2022, 1:17 PM   I spent a total of  15 Minutes   in face to face in clinical consultation, greater than 50% of which was counseling/coordinating care for ultrasound-guided thyroid biopsy with sedation.

## 2022-06-27 NOTE — Procedures (Signed)
Interventional Radiology Procedure Note  Procedure: US guided biopsy of right inferior and left inferior thyroid nodules  Indication: Suspicious thyroid nodules  Findings: Please refer to procedural dictation for full description.  Complications: None  EBL: < 10 mL  Miachel Roux, MD [phone]

## 2022-06-29 LAB — CYTOLOGY - NON PAP

## 2022-07-04 NOTE — Telephone Encounter (Signed)
FYI/UPDATE: Called [facility] spoke w/ rep in biopsy dept to f/u if order was canceled. They state that procedure was completed on 09/26 for final scan. That biopsy has been completed. Informed pcp will be notified that requested order has been completed and that results will be sent to pcp.--------DD,RMA

## 2022-07-05 ENCOUNTER — Telehealth: Payer: Self-pay | Admitting: Internal Medicine

## 2022-07-05 ENCOUNTER — Encounter: Payer: Self-pay | Admitting: Internal Medicine

## 2022-07-05 NOTE — Telephone Encounter (Signed)
Phone call placed to patient again today to go over the results of thyroid biopsy.  I left a message informing her of who I am.  I told her that I will send her a MyChart message in regards to this and I request that she take a look at it.

## 2022-07-05 NOTE — Telephone Encounter (Signed)
PC placed to pt today to go over results of thyroid biopsy. LVMM on her VM informing her of who I am and reason for calling.  I will try to reach her again later.

## 2022-07-06 ENCOUNTER — Encounter: Payer: Self-pay | Admitting: Internal Medicine

## 2022-07-06 ENCOUNTER — Other Ambulatory Visit: Payer: Self-pay | Admitting: Internal Medicine

## 2022-07-06 DIAGNOSIS — E041 Nontoxic single thyroid nodule: Secondary | ICD-10-CM

## 2022-07-06 NOTE — Progress Notes (Signed)
This is the message that I sent to pt's Mychart yesterday:  The biopsy of the nodule on your right thyroid gland came back okay meaning no cancerous cells.  The biopsy done on the left thyroid nodule did not yield enough cells to be examined fully.  I know this is frustrating given that this was the second attempt at doing the biopsies.  At this point, I would recommend referring you to a thyroid surgeon Dr. Tera Helper for him to take a look at the biopsy results and ultra sound result and make a determination of whether we need to pursue the nodule on the left any further.  Please let me know if you are okay with this and I will submit the referral.    Pt is agreeable to referral to Dr. Tera Helper.

## 2022-07-13 ENCOUNTER — Ambulatory Visit: Payer: Medicare Other | Admitting: Pharmacist

## 2022-08-17 ENCOUNTER — Ambulatory Visit: Payer: Medicare Other | Admitting: Pharmacist

## 2022-09-07 ENCOUNTER — Ambulatory Visit
Admission: EM | Admit: 2022-09-07 | Discharge: 2022-09-07 | Disposition: A | Discharge status: Home / Self Care | Payer: Medicare Other | Attending: Internal Medicine | Admitting: Internal Medicine

## 2022-09-07 ENCOUNTER — Encounter: Payer: Self-pay | Admitting: Emergency Medicine

## 2022-09-07 ENCOUNTER — Other Ambulatory Visit: Payer: Self-pay

## 2022-09-07 DIAGNOSIS — H9211 Otorrhea, right ear: Secondary | ICD-10-CM

## 2022-09-07 HISTORY — DX: Essential (primary) hypertension: I10

## 2022-09-07 MED ORDER — AMOXICILLIN-POT CLAVULANATE 875-125 MG PO TABS: 1.0000 | ORAL_TABLET | Freq: Two times a day (BID) | ORAL | 0 refills | Status: DC

## 2022-09-07 MED ORDER — OFLOXACIN 0.3 % OT SOLN: 10.0000 [drp] | Freq: Every day | OTIC | 0 refills | Status: AC

## 2022-09-07 NOTE — ED Triage Notes (Signed)
Pt here for right ear pain with some drainage x 5 days

## 2022-09-07 NOTE — Discharge Instructions (Signed)
I am treating you with an oral antibiotic and antibiotic eardrops to treat possible right ear infection and perforation of your eardrum.  Please follow-up with ear, nose, throat specialist for further evaluation and management.

## 2022-09-07 NOTE — ED Provider Notes (Signed)
[facility]    CSN: [id number] Arrival date & time: 09/07/22  1651      History   Chief Complaint Chief Complaint  Patient presents with   Otalgia    HPI Janet Fowler is a 71 y.o. female.   Patient presents with right ear drainage and discomfort that started about 5 days ago.  Patient reports that she has also has some nasal congestion associated with it but is not currently concerned about this.  She states that she is mainly concerned about her ear discomfort.  Denies any associated fever.  Denies trauma or foreign body to the ear.  Does not report medications to help alleviate symptoms.  Patient has mildly elevated blood pressure and reports that she has been taking her medication as prescribed.  Denies chest pain, shortness of breath, nausea, vomiting, headache, blurred vision, dizziness.   Otalgia   Past Medical History:  Diagnosis Date   Hypertension     Patient Active Problem List   Diagnosis Date Noted   Essential hypertension 05/23/2022    Past Surgical History:  Procedure Laterality Date   TONSILLECTOMY      OB History   No obstetric history on file.      Home Medications    Prior to Admission medications   Medication Sig Start Date End Date Taking? Authorizing Provider  amoxicillin-clavulanate (AUGMENTIN) 875-125 MG tablet Take 1 tablet by mouth every 12 (twelve) hours. 09/07/22  Yes Ermal Brzozowski, Hildred Alamin E, FNP  ofloxacin (FLOXIN) 0.3 % OTIC solution Place 10 drops into the right ear daily for 7 days. 09/07/22 09/14/22 Yes Chaitanya Amedee, Michele Rockers, FNP  acetaminophen (TYLENOL) 325 MG tablet Take 325 mg by mouth every 6 (six) hours as needed (pain.).    [provider]  amLODipine (NORVASC) 5 MG tablet Take 1 tablet (5 mg total) by mouth daily. 06/08/22   Ladell Pier, MD    Family History Family History  Problem Relation Age of Onset   Hypertension Mother    Stroke Father    Hypertension Father     Social History Social History    Tobacco Use   Smoking status: Never   Smokeless tobacco: Never  Vaping Use   Vaping Use: Never used  Substance Use Topics   Alcohol use: Never   Drug use: Never     Allergies   Patient has no known allergies.   Review of Systems Review of Systems Per HPI  Physical Exam Triage Vital Signs ED Triage Vitals  Enc Vitals Group     BP 09/07/22 1750 (!) 162/87     Pulse Rate 09/07/22 1750 77     Resp 09/07/22 1750 18     Temp 09/07/22 1750 98.2 F (36.8 C)     Temp Source 09/07/22 1750 Oral     SpO2 09/07/22 1750 99 %     Weight --      Height --      Head Circumference --      Peak Flow --      Pain Score 09/07/22 1751 4     Pain Loc --      Pain Edu? --      Excl. in [place]? --    No data found.  Updated Vital Signs BP (!) 162/87 (BP Location: Left Arm)   Pulse 77   Temp 98.2 F (36.8 C) (Oral)   Resp 18   SpO2 99%   Visual Acuity Right Eye Distance:   Left Eye  Distance:   Bilateral Distance:    Right Eye Near:   Left Eye Near:    Bilateral Near:     Physical Exam Constitutional:      General: She is not in acute distress.    Appearance: Normal appearance. She is not toxic-appearing or diaphoretic.  HENT:     Head: Normocephalic and atraumatic.     Right Ear: External ear normal.     Left Ear: Tympanic membrane, ear canal and external ear normal.     Ears:     Comments: Patient has copious amounts of purulent drainage coming from right ear.  I am not able to visualize right TM.  External canal of right ear appears mildly swollen. Eyes:     Extraocular Movements: Extraocular movements intact.     Conjunctiva/sclera: Conjunctivae normal.     Pupils: Pupils are equal, round, and reactive to light.  Cardiovascular:     Rate and Rhythm: Normal rate and regular rhythm.     Pulses: Normal pulses.     Heart sounds: Normal heart sounds.  Pulmonary:     Effort: Pulmonary effort is normal. No respiratory distress.     Breath sounds: Normal breath sounds.   Neurological:     General: No focal deficit present.     Mental Status: She is alert and oriented to person, place, and time. Mental status is at baseline.     Cranial Nerves: Cranial nerves 2-12 are intact.     Sensory: Sensation is intact.     Motor: Motor function is intact.     Coordination: Coordination is intact.     Gait: Gait is intact.  Psychiatric:        Mood and Affect: Mood normal.        Behavior: Behavior normal.        Thought Content: Thought content normal.        Judgment: Judgment normal.      UC Treatments / Results  Labs (all labs ordered are listed, but only abnormal results are displayed) Labs Reviewed - No data to display  EKG   Radiology No results found.  Procedures Procedures (including critical care time)  Medications Ordered in UC Medications - No data to display  Initial Impression / Assessment and Plan / UC Course  I have reviewed the triage vital signs and the nursing notes.  Pertinent labs & imaging results that were available during my care of the patient were reviewed by me and considered in my medical decision making (see chart for details).     Patient has copious amounts of drainage coming from right ear which is concerning for TM perforation versus ear infection.  I am not able to visualize TM.  Will treat with ofloxacin eardrops and Augmentin.  Patient's insurance does not cover Ciprodex and it is very expensive which is why ofloxacin was chosen.  Patient advised to follow-up with ENT specialist at provided contact information as well.  Patient also has mildly elevated blood pressure with recheck being similar.  Patient advised to monitor blood pressure at home and follow-up if it remains elevated.  Patient is asymptomatic regarding blood pressure so no concern for emergent evaluation is necessary.  Patient was given strict follow-up precautions for blood pressure management.  Discussed return precautions.  Patient verbalized  understanding and was agreeable with plan.   Final Clinical Impressions(s) / UC Diagnoses   Final diagnoses:  Drainage from right ear     Discharge Instructions  I am treating you with an oral antibiotic and antibiotic eardrops to treat possible right ear infection and perforation of your eardrum.  Please follow-up with ear, nose, throat specialist for further evaluation and management.    ED Prescriptions     Medication Sig Dispense Auth. Provider   amoxicillin-clavulanate (AUGMENTIN) 875-125 MG tablet Take 1 tablet by mouth every 12 (twelve) hours. 14 tablet [place], Leon E, [place]   ofloxacin (FLOXIN) 0.3 % OTIC solution Place 10 drops into the right ear daily for 7 days. 3.5 mL Teodora Medici, [place]      PDMP not reviewed this encounter.   Teodora Medici, [place] 09/07/22 2036

## 2022-09-21 ENCOUNTER — Ambulatory Visit: Payer: Medicare Other | Attending: Family Medicine | Admitting: Pharmacist

## 2022-09-21 VITALS — BP 143/84 | HR 64

## 2022-09-21 DIAGNOSIS — I10 Essential (primary) hypertension: Secondary | ICD-10-CM | POA: Diagnosis not present

## 2022-09-21 MED ORDER — AMLODIPINE BESYLATE 10 MG PO TABS: 10.0000 mg | ORAL_TABLET | Freq: Every day | ORAL | 1 refills | Status: DC

## 2022-09-21 NOTE — Progress Notes (Signed)
   S:     No chief complaint on file.  Janet Fowler is a 71 y.o. female who presents for hypertension evaluation, education, and management.  PMH is significant for HTN.  Patient was referred and last seen by Primary Care Provider, Dr. Wynetta Emery, on 05/23/2022. We saw her in September and added amlodipine.   Today, patient arrives in good spirits and presents without assistance. Denies dizziness, headache, blurred vision, swelling.   Patient reports hypertension was diagnosed in 05/23/2022.   Family/Social history:  Fhx: stroke, HTN. Tobacco: Never smoker  Reported home BP readings:  Brings her log with her today -SBP: 116 - 157 -DBP: 69 - 88  Patient reported dietary habits:  -Eats at least 2 meals a day. Patient is adherent with salt restrictions since last visit.  Patient-reported exercise habits:  -Patient doesn't have an exercising regimen but plans to start having daily walks.  O:  Vitals:   09/21/22 1344  BP: (!) 143/84  Pulse: 64    Last 3 Office BP readings: BP Readings from Last 3 Encounters:  09/21/22 (!) 143/84  09/07/22 (!) 162/87  06/27/22 (!) 142/77   BMET    Component Value Date/Time   NA 143 06/14/2022 1207   K 4.3 06/14/2022 1207   CL 104 06/14/2022 1207   CO2 20 06/14/2022 1207   GLUCOSE 88 06/14/2022 1207   GLUCOSE 103 (H) 04/19/2022 1750   BUN 10 06/14/2022 1207   CREATININE 0.97 06/14/2022 1207   CALCIUM 10.1 06/14/2022 1207   GFRNONAA 49 (L) 04/19/2022 1750   Renal function: CrCl cannot be calculated (Patient's most recent lab result is older than the maximum 21 days allowed.).  Clinical ASCVD: No  The 10-year ASCVD risk score (Arnett DK, et al., 2019) is: 20.5%   Values used to calculate the score:     Age: 32 years     Sex: Female     Is Non-Hispanic African American: Yes     Diabetic: No     Tobacco smoker: No     Systolic Blood Pressure: 808 mmHg     Is BP treated: Yes     HDL Cholesterol: 86 mg/dL     Total Cholesterol:  226 mg/dL  A/P: Hypertension newly diagnosed currently uncontrolled. BP goal < 130/80 mmHg.  -Increase amlodipine to 10 mg daily.  -Patient educated on purpose, proper use, and potential adverse effects of Amlodipine.  -F/u labs ordered - none -Counseled on lifestyle modifications for blood pressure control including reduced dietary sodium, increased exercise, adequate sleep. -Encouraged patient to check BP at home and bring log of readings to next visit. Counseled on proper use of home BP cuff.   Results reviewed and written information provided.    Written patient instructions provided. Patient verbalized understanding of treatment plan.  Total time in face to face counseling 30 minutes.    Follow-up:  -PCP in one month.   Pharmacist: Benard Halsted, PharmD, [place], [place] [phone]

## 2022-10-02 HISTORY — PX: BIOPSY THYROID: PRO38

## 2022-10-17 ENCOUNTER — Other Ambulatory Visit: Payer: Self-pay | Admitting: Internal Medicine

## 2022-10-17 ENCOUNTER — Encounter: Payer: Self-pay | Admitting: Internal Medicine

## 2022-10-17 ENCOUNTER — Ambulatory Visit
Admission: RE | Admit: 2022-10-17 | Discharge: 2022-10-17 | Disposition: A | Discharge status: Home / Self Care | Payer: Medicare Other | Source: Ambulatory Visit | Attending: Internal Medicine | Admitting: Internal Medicine

## 2022-10-17 ENCOUNTER — Ambulatory Visit: Payer: Medicare Other | Attending: Internal Medicine | Admitting: Internal Medicine

## 2022-10-17 VITALS — BP 130/76 | HR 61 | Temp 97.9°F | Ht 62.0 in | Wt 204.0 lb

## 2022-10-17 DIAGNOSIS — I10 Essential (primary) hypertension: Secondary | ICD-10-CM

## 2022-10-17 DIAGNOSIS — Z23 Encounter for immunization: Secondary | ICD-10-CM

## 2022-10-17 DIAGNOSIS — E782 Mixed hyperlipidemia: Secondary | ICD-10-CM

## 2022-10-17 DIAGNOSIS — Z1231 Encounter for screening mammogram for malignant neoplasm of breast: Secondary | ICD-10-CM

## 2022-10-17 DIAGNOSIS — E041 Nontoxic single thyroid nodule: Secondary | ICD-10-CM

## 2022-10-17 DIAGNOSIS — Q283 Other malformations of cerebral vessels: Secondary | ICD-10-CM
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
## 2022-10-17 NOTE — Patient Instructions (Signed)
Please call [facility] to schedule an appointment with Dr. Harlow Asa for thyroid nodule.  Ph.# [phone] Fax # [phone] address [place]  [place]

## 2022-10-18 ENCOUNTER — Ambulatory Visit: Payer: Medicare Other | Attending: Family Medicine

## 2022-10-18 DIAGNOSIS — I10 Essential (primary) hypertension: Secondary | ICD-10-CM | POA: Diagnosis not present

## 2022-10-18 DIAGNOSIS — E782 Mixed hyperlipidemia: Secondary | ICD-10-CM | POA: Diagnosis not present

## 2022-10-19 ENCOUNTER — Other Ambulatory Visit: Payer: Self-pay | Admitting: Internal Medicine

## 2022-10-19 DIAGNOSIS — R928 Other abnormal and inconclusive findings on diagnostic imaging of breast: Secondary | ICD-10-CM

## 2022-10-19 LAB — LIPID PANEL
Chol/HDL Ratio: 2.4 ratio (ref 0.0–4.4)
Cholesterol, Total: 203 mg/dL — ABNORMAL HIGH (ref 100–199)
HDL: 84 mg/dL (ref >39)
LDL Chol Calc (NIH): 104 mg/dL — ABNORMAL HIGH (ref 0–99)
Triglycerides: 84 mg/dL (ref 0–149)
VLDL Cholesterol Cal: 15 mg/dL (ref 5–40)

## 2022-10-19 LAB — BASIC METABOLIC PANEL
BUN/Creatinine Ratio: 12 (ref 12–28)
BUN: 12 mg/dL (ref 8–27)
CO2: 23 mmol/L (ref 20–29)
Calcium: 9.6 mg/dL (ref 8.7–10.3)
Chloride: 102 mmol/L (ref 96–106)
Creatinine, Ser: 0.97 mg/dL (ref 0.57–1.00)
Glucose: 96 mg/dL (ref 70–99)
Potassium: 4.3 mmol/L (ref 3.5–5.2)
Sodium: 138 mmol/L (ref 134–144)
eGFR: 62 mL/min/{1.73_m2} (ref >59)

## 2022-10-23 ENCOUNTER — Ambulatory Visit
Admission: RE | Admit: 2022-10-23 | Discharge: 2022-10-23 | Disposition: A | Discharge status: Home / Self Care | Payer: Medicare Other | Source: Ambulatory Visit | Attending: Internal Medicine | Admitting: Internal Medicine

## 2022-10-23 ENCOUNTER — Other Ambulatory Visit: Payer: Self-pay | Admitting: Internal Medicine

## 2022-10-23 DIAGNOSIS — N641 Fat necrosis of breast: Secondary | ICD-10-CM | POA: Diagnosis not present

## 2022-10-23 DIAGNOSIS — R921 Mammographic calcification found on diagnostic imaging of breast: Secondary | ICD-10-CM | POA: Diagnosis not present

## 2022-10-23 DIAGNOSIS — R928 Other abnormal and inconclusive findings on diagnostic imaging of breast: Secondary | ICD-10-CM

## 2022-10-30 ENCOUNTER — Ambulatory Visit
Admission: RE | Admit: 2022-10-30 | Discharge: 2022-10-30 | Disposition: A | Discharge status: Home / Self Care | Payer: Medicare Other | Source: Ambulatory Visit | Attending: Internal Medicine | Admitting: Internal Medicine

## 2022-10-30 DIAGNOSIS — R921 Mammographic calcification found on diagnostic imaging of breast: Secondary | ICD-10-CM

## 2022-10-30 DIAGNOSIS — D0511 Intraductal carcinoma in situ of right breast: Secondary | ICD-10-CM | POA: Diagnosis not present

## 2022-10-30 DIAGNOSIS — R928 Other abnormal and inconclusive findings on diagnostic imaging of breast: Secondary | ICD-10-CM

## 2022-10-30 HISTORY — PX: BREAST BIOPSY: SHX20

## 2022-11-01 ENCOUNTER — Telehealth: Payer: Self-pay | Admitting: *Deleted

## 2022-11-01 ENCOUNTER — Telehealth: Payer: Self-pay | Admitting: Internal Medicine

## 2022-11-01 NOTE — Telephone Encounter (Signed)
Spoke to patient to confirm upcoming afternoon [facility] clinic appointment on 2/7, paperwork will be sent via mail.   Gave location and time, also informed patient that the surgeon's office would be calling as well to get information from them similar to the packet that they will be receiving so make sure to do both.  Reminded patient that all providers will be coming to the clinic to see them HERE and if they had any questions to not hesitate to reach back out to myself or their navigators.

## 2022-11-01 NOTE — Telephone Encounter (Signed)
PC placed to pt this evening. Left VMM letting her know that I saw the results of her breast biopsy and that she was given results already by the radiologist.  I see that they set appt for her with [facility] at [place].  Advised to give me a call or send Mychart message if she has any questions or concerns.

## 2022-11-06 ENCOUNTER — Encounter: Payer: Self-pay | Admitting: *Deleted

## 2022-11-06 DIAGNOSIS — D0511 Intraductal carcinoma in situ of right breast: Secondary | ICD-10-CM | POA: Insufficient documentation
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
## 2022-11-08 ENCOUNTER — Encounter: Payer: Self-pay | Admitting: Physical Therapy

## 2022-11-08 ENCOUNTER — Inpatient Hospital Stay: Payer: Medicare Other | Attending: Hematology | Admitting: Hematology

## 2022-11-08 ENCOUNTER — Inpatient Hospital Stay (HOSPITAL_BASED_OUTPATIENT_CLINIC_OR_DEPARTMENT_OTHER): Payer: Medicare Other | Admitting: Genetic Counselor

## 2022-11-08 ENCOUNTER — Ambulatory Visit: Payer: Self-pay | Admitting: General Surgery

## 2022-11-08 ENCOUNTER — Encounter: Payer: Self-pay | Admitting: Hematology

## 2022-11-08 ENCOUNTER — Other Ambulatory Visit: Payer: Self-pay

## 2022-11-08 ENCOUNTER — Ambulatory Visit
Admission: RE | Admit: 2022-11-08 | Discharge: 2022-11-08 | Disposition: A | Discharge status: Home / Self Care | Payer: Medicare Other | Source: Ambulatory Visit | Attending: Radiation Oncology | Admitting: Radiation Oncology

## 2022-11-08 ENCOUNTER — Encounter: Payer: Self-pay | Admitting: Genetic Counselor

## 2022-11-08 ENCOUNTER — Encounter: Payer: Self-pay | Admitting: General Practice

## 2022-11-08 ENCOUNTER — Ambulatory Visit: Payer: Medicare Other | Attending: Hematology | Admitting: Physical Therapy

## 2022-11-08 ENCOUNTER — Inpatient Hospital Stay: Payer: Medicare Other

## 2022-11-08 VITALS — BP 150/66 | HR 76 | Temp 98.0°F | Resp 18 | Ht 62.0 in | Wt 206.0 lb

## 2022-11-08 DIAGNOSIS — D0511 Intraductal carcinoma in situ of right breast: Secondary | ICD-10-CM | POA: Insufficient documentation

## 2022-11-08 DIAGNOSIS — Z170 Estrogen receptor positive status [ER+]: Secondary | ICD-10-CM | POA: Diagnosis not present

## 2022-11-08 DIAGNOSIS — Z800 Family history of malignant neoplasm of digestive organs: Secondary | ICD-10-CM | POA: Insufficient documentation

## 2022-11-08 DIAGNOSIS — R293 Abnormal posture: Secondary | ICD-10-CM | POA: Insufficient documentation

## 2022-11-08 DIAGNOSIS — Z9011 Acquired absence of right breast and nipple: Secondary | ICD-10-CM | POA: Insufficient documentation

## 2022-11-08 DIAGNOSIS — I10 Essential (primary) hypertension: Secondary | ICD-10-CM | POA: Insufficient documentation

## 2022-11-08 DIAGNOSIS — Z8042 Family history of malignant neoplasm of prostate: Secondary | ICD-10-CM | POA: Insufficient documentation

## 2022-11-08 DIAGNOSIS — C50511 Malignant neoplasm of lower-outer quadrant of right female breast: Secondary | ICD-10-CM | POA: Diagnosis not present

## 2022-11-08 DIAGNOSIS — Z79899 Other long term (current) drug therapy: Secondary | ICD-10-CM | POA: Diagnosis not present

## 2022-11-08 DIAGNOSIS — R232 Flushing: Secondary | ICD-10-CM | POA: Insufficient documentation

## 2022-11-08 LAB — CMP (CANCER CENTER ONLY)
ALT: 6 U/L (ref 0–44)
AST: 13 U/L — ABNORMAL LOW (ref 15–41)
Albumin: 4.2 g/dL (ref 3.5–5.0)
Alkaline Phosphatase: 70 U/L (ref 38–126)
Anion gap: 6 (ref 5–15)
BUN: 9 mg/dL (ref 8–23)
CO2: 27 mmol/L (ref 22–32)
Calcium: 9.7 mg/dL (ref 8.9–10.3)
Chloride: 106 mmol/L (ref 98–111)
Creatinine: 0.89 mg/dL (ref 0.44–1.00)
GFR, Estimated: >60 mL/min (ref >60)
Glucose, Bld: 84 mg/dL (ref 70–99)
Potassium: 3.9 mmol/L (ref 3.5–5.1)
Sodium: 139 mmol/L (ref 135–145)
Total Bilirubin: 0.4 mg/dL (ref 0.3–1.2)
Total Protein: 7.6 g/dL (ref 6.5–8.1)

## 2022-11-08 LAB — CBC WITH DIFFERENTIAL (CANCER CENTER ONLY)
Abs Immature Granulocytes: 0.01 10*3/uL (ref 0.00–0.07)
Basophils Absolute: 0 10*3/uL (ref 0.0–0.1)
Basophils Relative: 1 %
Eosinophils Absolute: 0.1 10*3/uL (ref 0.0–0.5)
Eosinophils Relative: 3 %
HCT: 40.6 % (ref 36.0–46.0)
Hemoglobin: 13.7 g/dL (ref 12.0–15.0)
Immature Granulocytes: 0 %
Lymphocytes Relative: 46 %
Lymphs Abs: 1.9 10*3/uL (ref 0.7–4.0)
MCH: 29.3 pg (ref 26.0–34.0)
MCHC: 33.7 g/dL (ref 30.0–36.0)
MCV: 86.9 fL (ref 80.0–100.0)
Monocytes Absolute: 0.4 10*3/uL (ref 0.1–1.0)
Monocytes Relative: 9 %
Neutro Abs: 1.7 10*3/uL (ref 1.7–7.7)
Neutrophils Relative %: 41 %
Platelet Count: 274 10*3/uL (ref 150–400)
RBC: 4.67 MIL/uL (ref 3.87–5.11)
RDW: 11.9 % (ref 11.5–15.5)
WBC Count: 4.1 10*3/uL (ref 4.0–10.5)
nRBC: 0 % (ref 0.0–0.2)

## 2022-11-08 LAB — GENETIC SCREENING ORDER

## 2022-11-08 MED ORDER — KETOROLAC TROMETHAMINE 15 MG/ML IJ SOLN: 15.0000 mg | Freq: Once | INTRAMUSCULAR | Status: AC

## 2022-11-08 NOTE — Therapy (Signed)
OUTPATIENT PHYSICAL THERAPY BREAST CANCER BASELINE EVALUATION   Patient Name: Janet Fowler MRN: [id number] DOB:23-Jan-1951, 72 y.o., female Today's Date: 11/08/2022  END OF SESSION:  PT End of Session - 11/08/22 1355     Visit Number 1    Number of Visits 2    Date for PT Re-Evaluation 01/03/23    PT Start Time 1326    PT Stop Time 1350    PT Time Calculation (min) 24 min    Activity Tolerance Patient tolerated treatment well    Behavior During Therapy [facility] for tasks assessed/performed             Past Medical History:  Diagnosis Date   Breast cancer ([place])    Hypertension    Past Surgical History:  Procedure Laterality Date   BREAST BIOPSY Right 10/30/2022   MM RT BREAST BX W LOC DEV 1ST LESION IMAGE BX SPEC STEREO GUIDE 10/30/2022 [facility]   BREAST BIOPSY Right 10/30/2022   MM RT BREAST BX W LOC DEV EA AD LESION IMG BX SPEC STEREO GUIDE 10/30/2022 [facility]   TONSILLECTOMY     Patient Active Problem List   Diagnosis Date Noted   Ductal carcinoma in situ (DCIS) of right breast 11/06/2022   Mixed hyperlipidemia 10/17/2022   Thyroid nodule 10/17/2022   Other malformations of cerebral vessels 10/17/2022   Essential hypertension 05/23/2022   REFERRING PROVIDER: Dr. Truitt Merle  REFERRING DIAG: Right breast cancer  THERAPY DIAG:  Malignant neoplasm of lower-outer quadrant of right breast of female, estrogen receptor positive ([place])  Abnormal posture  Rationale for Evaluation and Treatment: Rehabilitation  ONSET DATE: 10/17/2022  SUBJECTIVE:                                                                                                                                                                                           SUBJECTIVE STATEMENT: Patient reports she is here today to be seen by her medical team for her newly diagnosed right breast cancer.   PERTINENT HISTORY:  Patient was diagnosed on 10/17/2022 with right grade intermediate DCIS  breast cancer. It measures 9.1 cm and is located in the lower outer quadrant. It is ER/PR positive. She has hypertension.  PATIENT GOALS:   reduce lymphedema risk and learn post op HEP.   PAIN:  Are you having pain? No  PRECAUTIONS: Active CA   HAND DOMINANCE: right  WEIGHT BEARING RESTRICTIONS: No  FALLS:  Has patient fallen in last 6 months? 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  LEISURE: She does not exercise  PRIOR LEVEL OF FUNCTION: Independent   OBJECTIVE:  COGNITION: Overall cognitive status: Within functional limits for tasks assessed    POSTURE:  Forward head and rounded shoulders posture  UPPER EXTREMITY AROM/PROM:  A/PROM RIGHT   eval   Shoulder extension 52  Shoulder flexion 153  Shoulder abduction 150  Shoulder internal rotation 50  Shoulder external rotation 82    (Blank rows = not tested)  A/PROM LEFT   eval  Shoulder extension 52  Shoulder flexion 138  Shoulder abduction 136  Shoulder internal rotation 63  Shoulder external rotation 87    (Blank rows = not tested)  CERVICAL AROM: All within normal limits  UPPER EXTREMITY STRENGTH: WFL  LYMPHEDEMA ASSESSMENTS:   LANDMARK RIGHT   eval  10 cm proximal to olecranon process 33.2  Olecranon process 25.3  10 cm proximal to ulnar styloid process 22.6  Just proximal to ulnar styloid process 15.9  Across hand at thumb web space 18.7  At base of 2nd digit 6.5  (Blank rows = not tested)  LANDMARK LEFT   eval  10 cm proximal to olecranon process 33  Olecranon process 25.2  10 cm proximal to ulnar styloid process 21  Just proximal to ulnar styloid process 15.4  Across hand at thumb web space 18.3  At base of 2nd digit 6.3  (Blank rows = not tested)  L-DEX LYMPHEDEMA SCREENING:  The patient was assessed using the L-Dex machine today to  produce a lymphedema index baseline score. The patient will be reassessed on a regular basis (typically every 3 months) to obtain new L-Dex scores. If the score is > 6.5 points away from his/her baseline score indicating onset of subclinical lymphedema, it will be recommended to wear a compression garment for 4 weeks, 12 hours per day and then be reassessed. If the score continues to be > 6.5 points from baseline at reassessment, we will initiate lymphedema treatment. Assessing in this manner has a 95% rate of preventing clinically significant lymphedema.   L-DEX FLOWSHEETS - 11/08/22 1300       L-DEX LYMPHEDEMA SCREENING   Measurement Type Unilateral    L-DEX MEASUREMENT EXTREMITY Upper Extremity    POSITION  Standing    DOMINANT SIDE Right    At Risk Side Right    BASELINE SCORE (UNILATERAL) -2.7             QUICK DASH SURVEY:  Katina Dung - 11/08/22 0001     Open a tight or new jar No difficulty    Do heavy household chores (wash walls, wash floors) No difficulty    Carry a shopping bag or briefcase No difficulty    Wash your back No difficulty    Use a knife to cut food No difficulty    Recreational activities in which you take some force or impact through your arm, shoulder, or hand (golf, hammering, tennis) No difficulty    During the past week, to what extent has your arm, shoulder or hand problem interfered with your normal social activities with family, friends, neighbors, or groups? Not at all    During the past week, to what extent has your arm, shoulder or hand problem limited your work or other regular daily activities Not at all    Arm, shoulder, or hand pain. None    Tingling (pins and needles) in your arm, shoulder, or hand None    Difficulty Sleeping No difficulty  DASH Score 0 %              PATIENT EDUCATION:  Education details: Lymphedema risk reduction and post op shoulder/posture HEP Person educated: Patient Education method: Explanation,  Demonstration, Handout Education comprehension: Patient verbalized understanding and returned demonstration  HOME EXERCISE PROGRAM: Patient was instructed today in a home exercise program today for post op shoulder range of motion. These included active assist shoulder flexion in sitting, scapular retraction, wall walking with shoulder abduction, and hands behind head external rotation.  She was encouraged to do these twice a day, holding 3 seconds and repeating 5 times when permitted by her physician.   ASSESSMENT:  CLINICAL IMPRESSION: Patient was diagnosed on 10/17/2022 with right grade intermediate DCIS breast cancer. It measures 9.1 cm and is located in the lower outer quadrant. It is ER/PR positive. She has hypertension. Her multidisciplinary medical team met prior to her assessments to determine a recommended treatment plan. She is planning to have a right mastectomy with a sentinel node biopsy and reconstruction followed by anti-estrogen therapy. She will benefit from a post op PT reassessment to determine needs and from L-Dex screens every 3 months for 2 years to detect subclinical lymphedema.  Pt will benefit from skilled therapeutic intervention to improve on the following deficits: Decreased knowledge of precautions, impaired UE functional use, pain, decreased ROM, postural dysfunction.   PT treatment/interventions: ADL/self-care home management, pt/family education, therapeutic exercise  REHAB POTENTIAL: Excellent  CLINICAL DECISION MAKING: Stable/uncomplicated  EVALUATION COMPLEXITY: Low   GOALS: Goals reviewed with patient? YES  LONG TERM GOALS: (STG=LTG)    Name Target Date Goal status  1 Pt will be able to verbalize understanding of pertinent lymphedema risk reduction practices relevant to her dx specifically related to skin care.  Baseline:  No knowledge 11/08/2022 Achieved at eval  2 Pt will be able to return demo and/or verbalize understanding of the post op HEP  related to regaining shoulder ROM. Baseline:  No knowledge 11/08/2022 Achieved at eval  3 Pt will be able to verbalize understanding of the importance of attending the post op After Breast CA Class for further lymphedema risk reduction education and therapeutic exercise.  Baseline:  No knowledge 11/08/2022 Achieved at eval  4 Pt will demo she has regained full shoulder ROM and function post operatively compared to baselines.  Baseline: See objective measurements taken today. 01/03/2023     PLAN:  PT FREQUENCY/DURATION: EVAL and 1 follow up appointment.   PLAN FOR NEXT SESSION: will reassess 3-4 weeks post op to determine needs.   Patient will follow up at outpatient cancer rehab 3-4 weeks following surgery.  If the patient requires physical therapy at that time, a specific plan will be dictated and sent to the referring physician for approval. The patient was educated today on appropriate basic range of motion exercises to begin post operatively and the importance of attending the After Breast Cancer class following surgery.  Patient was educated today on lymphedema risk reduction practices as it pertains to recommendations that will benefit the patient immediately following surgery.  She verbalized good understanding.    Physical Therapy Information for After Breast Cancer Surgery/Treatment:  Lymphedema is a swelling condition that you may be at risk for in your arm if you have lymph nodes removed from the armpit area.  After a sentinel node biopsy, the risk is approximately 5-9% and is higher after an axillary node dissection.  There is treatment available for this condition and it is not  life-threatening.  Contact your physician or physical therapist with concerns. You may begin the 4 shoulder/posture exercises (see additional sheet) when permitted by your physician (typically a week after surgery).  If you have drains, you may need to wait until those are removed before beginning range of motion  exercises.  A general recommendation is to not lift your arms above shoulder height until drains are removed.  These exercises should be done to your tolerance and gently.  This is not a "no pain/no gain" type of recovery so listen to your body and stretch into the range of motion that you can tolerate, stopping if you have pain.  If you are having immediate reconstruction, ask your plastic surgeon about doing exercises as he or she may want you to wait. We encourage you to attend the free one time ABC (After Breast Cancer) class offered by [facility].  You will learn information related to lymphedema risk, prevention and treatment and additional exercises to regain mobility following surgery.  You can call [phone] for more information.  This is offered the 1st and 3rd Monday of each month.  You only attend the class one time. While undergoing any medical procedure or treatment, try to avoid blood pressure being taken or needle sticks from occurring on the arm on the side of cancer.   This recommendation begins after surgery and continues for the rest of your life.  This may help reduce your risk of getting lymphedema (swelling in your arm). An excellent resource for those seeking information on lymphedema is the National Lymphedema Network's web site. It can be accessed at [place].org If you notice swelling in your hand, arm or breast at any time following surgery (even if it is many years from now), please contact your doctor or physical therapist to discuss this.  Lymphedema can be treated at any time but it is easier for you if it is treated early on.  If you feel like your shoulder motion is not returning to normal in a reasonable amount of time, please contact your surgeon or physical therapist.  [facility] [phone]. [address], [address], [place] [address]  ABC CLASS After Breast Cancer Class  After Breast Cancer Class  is a specially designed exercise class to assist you in a safe recover after having breast cancer surgery.  In this class you will learn how to get back to full function whether your drains were just removed or if you had surgery a month ago.  This one-time class is held the 1st and 3rd Monday of every month from 11:00 a.m. until 12:00 noon virtually.  This class is FREE and space is limited. For more information or to register for the next available class, call [phone].  Class Goals  Understand specific stretches to improve the flexibility of you chest and shoulder. Learn ways to safely strengthen your upper body and improve your posture. Understand the warning signs of infection and why you may be at risk for an arm infection. Learn about Lymphedema and prevention.  ** You do not attend this class until after surgery.  Drains must be removed to participate  Patient was instructed today in a home exercise program today for post op shoulder range of motion. These included active assist shoulder flexion in sitting, scapular retraction, wall walking with shoulder abduction, and hands behind head external rotation.  She was encouraged to do these twice a day, holding 3 seconds and  repeating 5 times when permitted by her physician.  Annia Friendly, [place] 11/08/22 2:00 PM

## 2022-11-08 NOTE — Progress Notes (Unsigned)
REFERRING PROVIDER: Truitt Merle, MD  PRIMARY PROVIDER:  Ladell Pier, MD  PRIMARY REASON FOR VISIT:  1. Ductal carcinoma in situ (DCIS) of right breast    HISTORY OF PRESENT ILLNESS:   Janet Fowler, a 72 y.o. female, was seen for a [place] cancer genetics consultation at the request of Dr. Burr Medico due to a personal and family history of cancer.  Janet Fowler presents to clinic today to discuss the possibility of a hereditary predisposition to cancer, to discuss genetic testing, and to further clarify her future cancer risks, as well as potential cancer risks for family members.   In February 2024, at the age of 31, Janet Fowler was diagnosed with right breast cancer (ER/PR positive). ***  CANCER HISTORY:  Oncology History Overview Note   Cancer Staging  Ductal carcinoma in situ (DCIS) of right breast Staging form: Breast, AJCC 8th Edition - Clinical stage from 10/30/2022: Stage 0 (cTis (DCIS), cN0, cM0, G3, ER+, PR+, HER2: Not Assessed) - Signed by Truitt Merle, MD on 11/07/2022 Stage prefix: Initial diagnosis Histologic grading system: 3 grade system     Ductal carcinoma in situ (DCIS) of right breast  10/30/2022 Cancer Staging   Staging form: Breast, AJCC 8th Edition - Clinical stage from 10/30/2022: Stage 0 (cTis (DCIS), cN0, cM0, G3, ER+, PR+, HER2: Not Assessed) - Signed by Truitt Merle, MD on 11/07/2022 Stage prefix: Initial diagnosis Histologic grading system: 3 grade system   11/06/2022 Initial Diagnosis   Ductal carcinoma in situ (DCIS) of right breast    RISK FACTORS:  Menarche was at age 17.  First live birth at age 1.  OCP use for at least 5 years.  Ovaries intact: yes.  Uterus intact: yes.  Menopausal status: postmenopausal.  HRT use: 0 years. Colonoscopy: yes Mammogram within the last year: yes. Any excessive radiation exposure in the past: no  Past Medical History:  Diagnosis Date   Breast cancer ([place])    Hypertension     Past Surgical History:  Procedure  Laterality Date   BREAST BIOPSY Right 10/30/2022   MM RT BREAST BX W LOC DEV 1ST LESION IMAGE BX SPEC STEREO GUIDE 10/30/2022 [facility]   BREAST BIOPSY Right 10/30/2022   MM RT BREAST BX W LOC DEV EA AD LESION IMG BX SPEC STEREO GUIDE 10/30/2022 [facility]   TONSILLECTOMY      Social History   Socioeconomic History   Marital status: Single    Spouse name: Not on file   Number of children: 0   Years of education: Not on file   Highest education level: Some college, no degree  Occupational History   Occupation: Retired Network engineer  Tobacco Use   Smoking status: Never   Smokeless tobacco: Never  Vaping Use   Vaping Use: Never used  Substance and Sexual Activity   Alcohol use: Never   Drug use: Never   Sexual activity: Not on file  Other Topics Concern   Not on file  Social History Narrative   Not on file   Social Determinants of Health   Financial Resource Strain: Not on file  Food Insecurity: Not on file  Transportation Needs: Not on file  Physical Activity: Not on file  Stress: Not on file  Social Connections: Not on file     FAMILY HISTORY:  We obtained a detailed, 4-generation family history.  Significant diagnoses are listed below: Family History  Problem Relation Age of Onset   Hypertension Mother    Stroke Father  Hypertension Father    Colon cancer Brother 1 - 48   Prostate cancer Brother 55 - 79   Prostate cancer Brother 80 - 48     Janet Fowler has two brothers. One brother has a history of both prostate and colon cancer both diagnosed in his 7s. Her second brother has a history of prostate cancer in his 65s. Janet Fowler is unaware of previous family history of genetic testing for hereditary cancer risks. There is no known consanguinity.  GENETIC COUNSELING ASSESSMENT: Janet Fowler is a 72 y.o. female with a personal and family history of cancer which is somewhat suggestive of a hereditary predisposition to cancer. We, therefore, discussed and  recommended the following at today's visit.   DISCUSSION: We discussed that 5 - 10% of cancer is hereditary, with most cases of breast cancer associated with BRCA1/2.  There are other genes that can be associated with hereditary *** cancer syndromes.  We discussed that testing is beneficial for several reasons including knowing how to follow individuals after completing their treatment, identifying whether potential treatment options *** would be beneficial, and understanding if other family members could be at risk for cancer and allowing them to undergo genetic testing.   We reviewed the characteristics, features and inheritance patterns of hereditary cancer syndromes. We also discussed genetic testing, including the appropriate family members to test, the process of testing, insurance coverage and turn-around-time for results. We discussed the implications of a negative, positive, carrier and/or variant of uncertain significant result. We recommended Janet Fowler pursue genetic testing for a panel that includes genes associated with breast, prostate, and colon cancer.   Based on Janet Fowler's personal and family history of cancer, she meets medical criteria for genetic testing. Despite that she meets criteria, she may still have an out of pocket cost. We discussed that if her out of pocket cost for testing is over $100, the laboratory will call and confirm whether she wants to proceed with testing.  If the out of pocket cost of testing is less than $100 she will be billed by the genetic testing laboratory.   PLAN: Despite our recommendation, Janet Fowler did not wish to pursue genetic testing at today's visit. We understand this decision and remain available to coordinate genetic testing at any time in the future. We, therefore, recommend Janet Fowler continue to follow the cancer screening guidelines given by her primary healthcare provider.   Janet Fowler questions were answered to her satisfaction today.  Our contact information was provided should additional questions or concerns arise. Thank you for the referral and allowing [place] to share in the care of your patient.   Lucille Passy, MS, [facility] Genetic Counselor [email or web address] (P) [phone]  The patient was seen for a total of <15 minutes in face-to-face genetic counseling.  The patient brought her brother.  Drs. Lindi Adie and/or Burr Medico were available to discuss this case as needed.   _______________________________________________________________________ For Office Staff:  Number of people involved in session: 2 Was an Intern/ student involved with case: no

## 2022-11-08 NOTE — Progress Notes (Signed)
[place] Psychosocial Distress Screening Spiritual Care  Met with Janet Fowler and her brother Janet Fowler in [facility] to introduce [facility] team/resources, reviewing distress screen per protocol.  The patient scored a 6 on the Psychosocial Distress Thermometer which indicates moderate distress. Also assessed for distress and other psychosocial needs.      11/08/2022    4:07 PM  ONCBCN DISTRESS SCREENING  Screening Type Initial Screening  Distress experienced in past week (1-10) 6  Emotional problem type Nervousness/Anxiety  Information Concerns Type Lack of info about treatment  Referral to support programs Yes    Chaplain and patient discussed common feelings and emotions when being diagnosed with cancer, and the importance of support during treatment.  Chaplain informed patient of the support team and support services at [facility].  Chaplain provided contact information and encouraged patient to call with any questions or concerns.   Follow up needed: No. [name] prefers to phone if needs arise.   [place], [place], [facility] Pager [phone] Voicemail [phone]

## 2022-11-13 ENCOUNTER — Ambulatory Visit: Payer: Medicare Other | Admitting: Plastic Surgery

## 2022-11-13 ENCOUNTER — Encounter: Payer: Self-pay | Admitting: Plastic Surgery

## 2022-11-13 ENCOUNTER — Institutional Professional Consult (permissible substitution): Payer: Medicare Other | Admitting: Plastic Surgery

## 2022-11-13 VITALS — BP 164/81 | HR 76 | Ht 62.0 in | Wt 204.4 lb

## 2022-11-13 DIAGNOSIS — D0511 Intraductal carcinoma in situ of right breast: Secondary | ICD-10-CM

## 2022-11-13 NOTE — Progress Notes (Signed)
Patient ID: Janet Fowler, female    DOB: 05-13-1951, 72 y.o.   MRN: [id number]   Chief Complaint  Patient presents with   Consult   Breast Cancer    The patient is a 72 year old female here for breast consultation for reconstruction.  She was diagnosed with right breast ductal carcinoma in situ, grade 2-3.  It is estrogen and progesterone positive.  She is 5 feet 2 inches tall weighs 204 pounds.  She is not a smoker and does not have diabetes.  Her surgeon is Dr. Marlou Starks.  Her preoperative bra size is a D/DD.  Past medical history is positive for hypertension and hyperlipidemia.  She has had a tonsillectomy but does not recall any other surgeries.      Review of Systems  Constitutional: Negative.   HENT: Negative.    Eyes: Negative.   Respiratory: Negative.    Cardiovascular: Negative.   Gastrointestinal: Negative.   Endocrine: Negative.   Genitourinary: Negative.   Musculoskeletal: Negative.   Allergic/Immunologic: Negative.   Neurological: Negative.   Hematological: Negative.   Psychiatric/Behavioral: Negative.      Past Medical History:  Diagnosis Date   Breast cancer ([place])    Hypertension     Past Surgical History:  Procedure Laterality Date   BREAST BIOPSY Right 10/30/2022   MM RT BREAST BX W LOC DEV 1ST LESION IMAGE BX SPEC STEREO GUIDE 10/30/2022 [facility]   BREAST BIOPSY Right 10/30/2022   MM RT BREAST BX W LOC DEV EA AD LESION IMG BX SPEC STEREO GUIDE 10/30/2022 [facility]   TONSILLECTOMY        Current Outpatient Medications:    acetaminophen (TYLENOL) 325 MG tablet, Take 325 mg by mouth every 6 (six) hours as needed (pain.)., Disp: , Rfl:    amLODipine (NORVASC) 10 MG tablet, Take 1 tablet (10 mg total) by mouth daily., Disp: 90 tablet, Rfl: 1 No current facility-administered medications for this visit.  Facility-Administered Medications Ordered in Other Visits:    ketorolac (TORADOL) 15 MG/ML injection 15 mg, 15 mg, Intravenous, Once,  Jovita Kussmaul, MD   Objective:   Vitals:   11/13/22 0811  BP: (!) 164/81  Pulse: 76  SpO2: 98%    Physical Exam Vitals and nursing note reviewed.  Constitutional:      Appearance: Normal appearance.  HENT:     Head: Normocephalic and atraumatic.  Cardiovascular:     Rate and Rhythm: Normal rate.     Pulses: Normal pulses.  Pulmonary:     Effort: Pulmonary effort is normal.  Abdominal:     General: There is no distension.     Palpations: Abdomen is soft.     Tenderness: There is no abdominal tenderness.  Musculoskeletal:        General: No swelling or deformity.  Skin:    General: Skin is warm.     Capillary Refill: Capillary refill takes less than 2 seconds.     Coloration: Skin is not jaundiced.     Findings: No bruising.  Neurological:     Mental Status: She is alert and oriented to person, place, and time.  Psychiatric:        Mood and Affect: Mood normal.        Behavior: Behavior normal.        Thought Content: Thought content normal.        Judgment: Judgment normal.     Assessment & Plan:  Ductal carcinoma in situ (DCIS)  of right breast  The options for reconstruction we explained to the patient / family for breast reconstruction.  There are two general categories of reconstruction.  We can reconstruction a breast with implants or use the patient's own tissue.  These were further discussed as listed.  Breast reconstruction is an optional procedure and eligibility depends on the full spectrum of the health of the patient and any co-morbidities.  More than one surgery is often needed to complete the reconstruction process.  The process can take three to twelve months to complete.  The breasts will not be identical due to many factors such as rib differences, shoulder asymmetry and treatments such as radiation.  The goal is to get the breasts to look normal and symmetrical in clothes.  Scars are a part of surgery and may fade some in time but will always be  present under clothes.  Surgery may be an option on the non-cancer breast to achieve more symmetry.  No matter which procedure is chosen there is always the risk of complications and even failure of the body to heal.  This could result in no breast.    The options for reconstruction include:  1. Placement of a tissue expander with Acellular dermal matrix. When the expander is the desired size surgery is performed to remove the expander and place an implant.  In some cases the implant can be placed without an expander.  2. Autologous reconstruction can include using a muscle or tissue from another area of the body to create a breast.  3. Combined procedures (ie. latissismus dorsi flap) can be done with an expander / implant placed under the muscle.   The risks, benefits, scars and recovery time were discussed for each of the above. Risks include bleeding, infection, hematoma, seroma, scarring, pain, wound healing complications, flap loss, fat necrosis, capsular contracture, need for implant removal, donor site complications, bulge, hernia, umbilical necrosis, need for urgent reoperation, and need for dressing changes.   The procedure the patient selected / that was best for the patient, was then discussed in further detail.  Total time: 45 minutes. This includes time spent with the patient during the visit as well as time spent before and after the visit reviewing the chart, documenting the encounter, making phone calls and reviewing studies.   The patient is a good candidate for immediate breast reconstruction with Flex HD and expander placement.  I did also tell her that no reconstruction is an option but she does want to have something done.  We also talked about mastopexy reduction on the left side at the second stage.  The patient would like to move ahead with reconstruction.  I have discussed this with Dr. Marlou Starks and we will place the order.  Pictures were obtained of the patient and placed in the  chart with the patient's or guardian's permission.   [place], DO

## 2022-11-15 ENCOUNTER — Ambulatory Visit
Admission: RE | Admit: 2022-11-15 | Discharge: 2022-11-15 | Disposition: A | Discharge status: Home / Self Care | Payer: Medicare Other | Source: Ambulatory Visit | Attending: Internal Medicine | Admitting: Internal Medicine

## 2022-11-15 DIAGNOSIS — Q283 Other malformations of cerebral vessels: Secondary | ICD-10-CM | POA: Diagnosis not present

## 2022-11-15 DIAGNOSIS — G939 Disorder of brain, unspecified: Secondary | ICD-10-CM | POA: Diagnosis not present

## 2022-11-15 MED ORDER — IOPAMIDOL (ISOVUE-370) INJECTION 76%
75.0000 mL | Freq: Once | INTRAVENOUS | Status: AC | PRN
  Administered 2022-11-15: 75 mL via INTRAVENOUS

## 2022-11-16 ENCOUNTER — Encounter: Payer: Self-pay | Admitting: *Deleted

## 2022-11-16 ENCOUNTER — Telehealth: Payer: Self-pay | Admitting: *Deleted

## 2022-11-16 NOTE — Telephone Encounter (Signed)
Janet Fowler calling report:  IMPRESSION: CT head:   1.  No evidence of an acute intracranial abnormality. 2. 17 x 6 mm lucent lesion within the left occipital calvarium, unchanged in size from the prior head CT of 04/19/2022. 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 3. Severe mucosal thickening within the right maxillary sinus at the imaged levels.   CTA head:   1. The prominent vessel in the left parietal region described on the prior non-contrast head CT of 04/19/2022 corresponds with a developmental venous anomaly. There is no evidence of an AVM/high-flow vascular malformation at this site. 2. No intracranial large vessel occlusion or proximal high-grade arterial stenosis.   Radiologist concern is #2 finding 17 x 6 mm lucent lesion within the left occipital calvarium

## 2022-11-16 NOTE — Telephone Encounter (Signed)
Routing to PCP for review.

## 2022-11-16 NOTE — Telephone Encounter (Signed)
Spoke to pt concerning [place] from 2.7.24. Denies questions or concerns regarding dx or treatment care plan. Encourage pt to call with needs. Received verbal understanding.

## 2022-11-17 ENCOUNTER — Telehealth: Payer: Self-pay | Admitting: Hematology

## 2022-11-17 NOTE — Telephone Encounter (Signed)
Per 2/16 IB reached out to patient to schedule , patient aware and confirmed.

## 2022-11-18 ENCOUNTER — Telehealth: Payer: Self-pay | Admitting: Internal Medicine

## 2022-11-18 NOTE — Telephone Encounter (Signed)
PC placed to pt this evening to go over results of CTA of head.  This revealed that the prominent vessel in the left parietal region that was described on CT 04/2022 corresponds with a developmental venous anomaly.  No evidence of AVM/high flow vascular malformation.  Incidental finding of a 17 x 6 mm lucent lesion within the left occipital calvarium unchanged in size from prior head CT 04/2022.  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  Follow-up noncontrast head CT is recommended in 6 months to ensure stability. Patient wanted me to be aware of her recent breast cancer diagnosis.  She will be having a right mastectomy.  I was able to review the chart.  Advised that given the cancer diagnosis, I will send a message to the oncologist Dr. Burr Medico to make sure she sees the CTA report as well to make sure this is not a metastatic lesion.

## 2022-11-19 ENCOUNTER — Telehealth: Payer: Self-pay | Admitting: Internal Medicine

## 2022-11-19 NOTE — Telephone Encounter (Signed)
-----   Message from Truitt Merle, MD sent at 11/19/2022 11:02 AM EST ----- Dr. Wynetta Emery,  Thanks for reaching out and let us know. Her breast cancer is DCIS, will not cause metastatic disease. I will be happy to order her follow up CT head when I see her in April.  Krista Blue  ----- Message ----- From: Ladell Pier, MD Sent: 11/18/2022   7:00 PM EST To: Hayden Pedro, PA-C; Truitt Merle, MD  Hi there.  I am the PCP for this patient. I just wanted to make you aware that she recently had a CTA of the head for follow-up on a questionable vascular abnormality seen on CT of the head done 04/2022.  The CTA showed no significant vascular abnormalities.  However there is mention of a 17 x 6 mm lesion seen on the calvarium/scalp.  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  However he recommends repeat scan in 6 months to make sure there is no change.  Given her new diagnosis of breast cancer, I wanted to make you aware of this in case there is any concern of metastasis.

## 2022-11-21 ENCOUNTER — Telehealth: Payer: Self-pay | Admitting: Internal Medicine

## 2022-11-21 NOTE — Telephone Encounter (Signed)
-----   Message from Hayden Pedro, [place] sent at 11/20/2022  7:49 AM EST ----- Thanks for letting us know; agree with Dr. Ernestina Penna reponse.  Bryson Ha :) ----- Message ----- From: Ladell Pier, MD Sent: 11/18/2022   7:00 PM EST To: Hayden Pedro, PA-C; Truitt Merle, MD  Hi there.  I am the PCP for this patient. I just wanted to make you aware that she recently had a CTA of the head for follow-up on a questionable vascular abnormality seen on CT of the head done 04/2022.  The CTA showed no significant vascular abnormalities.  However there is mention of a 17 x 6 mm lesion seen on the calvarium/scalp.  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  However he recommends repeat scan in 6 months to make sure there is no change.  Given her new diagnosis of breast cancer, I wanted to make you aware of this in case there is any concern of metastasis.

## 2022-12-01 DIAGNOSIS — C50919 Malignant neoplasm of unspecified site of unspecified female breast: Secondary | ICD-10-CM

## 2022-12-01 HISTORY — DX: Malignant neoplasm of unspecified site of unspecified female breast: C50.919

## 2022-12-05 ENCOUNTER — Institutional Professional Consult (permissible substitution): Payer: Medicare Other | Admitting: Plastic Surgery

## 2022-12-06 NOTE — H&P (View-Only) (Signed)
   Patient ID: Janet Fowler, female    DOB: 04/19/1951, 71 y.o.   MRN: [id number]  Chief Complaint  Patient presents with   Pre-op Exam      ICD-10-CM   1. Ductal carcinoma in situ (DCIS) of right breast  D05.11        History of Present Illness: Janet Fowler is a 71 y.o.  female  with a history of breast cancer.  She presents for preoperative evaluation for upcoming procedure, right breast reconstruction with placement of tissue expander, scheduled for 12/21/2022 with Dr.  Dillingham.  Patient will also be undergoing right mastectomy and sentinel biopsy with Dr. Toth at the same time.  The patient reports that she had a tonsillectomy as a teenager and that is the only general anesthesia that she has had before.  She denied any issues with that at that time.  Patient denies any cardiac diseases.  She denies taking any blood thinners.  Patient reports she is not a smoker.  Patient denies taking any birth control or hormone replacement.  Patient denies any history of miscarriages.  Patient denies any personal history of blood clots.  Patient does report that her mom has had a blood clot in her leg before.  Patient denies any personal or family history of clotting diseases.  Patient denies any recent surgeries, traumas, infections or hospitalizations.  She denies history of stroke or heart attack.  Patient states that she does not have a port site in place.  Patient denies any history of Crohn's disease or ulcerative colitis.  She denies any history of COPD or asthma.  She denies any varicosities on her lower extremities.  She denies any recent fevers, chills or changes in her health.  Patient states that she is currently a 40 D cup.  She states that she would like to be about the same size after the reconstruction.  I discussed with the patient the limitations in regards to size.  Patient expressed understanding.  Summary of Previous Visit: Patient was seen by Dr. Dillingham on 11/13/2022.   At this visit, patient presented for breast consultation for reconstruction.  She reported that she was diagnosed with right breast ductal carcinoma in situ, grade 2-3.  Patient reported that she was not a smoker and does not have diabetes.  Her preoperative bra size is a D/DD cup.  Patient was found to be a good candidate for immediate breast reconstruction with Flex HD and expander placement.  Job: Retired  PMH Significant for: Hypertension, right breast cancer.  Patient states the only medication she is taking regularly is Norvasc and then she takes Tylenol as needed.  Chemotherapy/radiation: Patient states that she was told that she will not need chemotherapy or radiation   Patient's blood pressure was a little elevated at her visit today.  I discussed with the patient that she should monitor her blood pressure at home.  She states that she has a blood pressure cuff at home.  I discussed with the patient that if it continues to remain elevated, she should follow-up with her PCP.  Patient expressed understanding.  Past Medical History: Allergies: No Known Allergies  Current Medications:  Current Outpatient Medications:    acetaminophen (TYLENOL) 325 MG tablet, Take 325 mg by mouth every 6 (six) hours as needed (pain.)., Disp: , Rfl:    amLODipine (NORVASC) 10 MG tablet, Take 1 tablet (10 mg total) by mouth daily., Disp: 90 tablet, Rfl: 1   penicillin v potassium (  VEETID) 500 MG tablet, Take 500 mg by mouth 4 (four) times daily., Disp: , Rfl:   Past Medical Problems: Past Medical History:  Diagnosis Date   Breast cancer ([facility])    Hypertension     Past Surgical History: Past Surgical History:  Procedure Laterality Date   BREAST BIOPSY Right 10/30/2022   MM RT BREAST BX W LOC DEV 1ST LESION IMAGE BX SPEC STEREO GUIDE 10/30/2022 [facility]   BREAST BIOPSY Right 10/30/2022   MM RT BREAST BX W LOC DEV EA AD LESION IMG BX SPEC STEREO GUIDE 10/30/2022 [facility]    TONSILLECTOMY      Social History: Social History   Socioeconomic History   Marital status: Single    Spouse name: Not on file   Number of children: 0   Years of education: Not on file   Highest education level: Some college, no degree  Occupational History   Occupation: Retired secretary  Tobacco Use   Smoking status: Never   Smokeless tobacco: Never  Vaping Use   Vaping Use: Never used  Substance and Sexual Activity   Alcohol use: Never   Drug use: Never   Sexual activity: Not on file  Other Topics Concern   Not on file  Social History Narrative   Not on file   Social Determinants of Health   Financial Resource Strain: Not on file  Food Insecurity: Not on file  Transportation Needs: Not on file  Physical Activity: Not on file  Stress: Not on file  Social Connections: Not on file  Intimate Partner Violence: Not on file    Family History: Family History  Problem Relation Age of Onset   Hypertension Mother    Stroke Father    Hypertension Father    Colon cancer Brother 70 - 79   Prostate cancer Brother 70 - 79   Prostate cancer Brother 70 - 79    Review of Systems: Denies any recent fevers, chills or changes in her health  Physical Exam: Vital Signs BP (!) 151/84 (BP Location: Left Arm, Patient Position: Sitting, Cuff Size: Normal)   Pulse 85   Ht 5' 2" (1.575 m)   Wt 204 lb (92.5 kg)   SpO2 100%   BMI 37.31 kg/m   Physical Exam  Constitutional:      General: Not in acute distress.    Appearance: Normal appearance. Not ill-appearing.  HENT:     Head: Normocephalic and atraumatic.  Neck:     Musculoskeletal: Normal range of motion.  Cardiovascular:     Rate and Rhythm: Normal rate Pulmonary:     Effort: Pulmonary effort is normal. No respiratory distress.  Musculoskeletal: Normal range of motion.  Skin:    General: Skin is warm and dry.     Findings: No erythema or rash.  Neurological:     Mental Status: Alert and oriented to person,  place, and time. Mental status is at baseline.  Psychiatric:        Mood and Affect: Mood normal.        Behavior: Behavior normal.    Assessment/Plan: The patient is scheduled for right breast with placement of tissue expander with Dr. Dillingham.  Risks, benefits, and alternatives of procedure discussed, questions answered and consent obtained.    Smoking Status: Non-smoker; Counseling Given?  N/A Last Mammogram: 10/23/2022; Results: BI-RADS Category 4, suspicious  Caprini Score: 10; Risk Factors include: Age, family history of thrombosis, history of malignancy, BMI > 25, and length of planned   surgery. Recommendation for mechanical and possible pharmacological prophylaxis. Encourage early ambulation.  I will discuss with Dr. Dillingham the possibility of postoperative Lovenox.  Pictures obtained: @consult  Post-op Rx sent to pharmacy: Tramadol, Zofran, Keflex, Valium -patient states that she recently had oxycodone from a tooth procedure.  She states that she was experiencing some nausea with the oxycodone.  I discussed with the patient that we can try tramadol, patient was in agreement with this.  I instructed the patient to not take tramadol and Valium at the same time as these can be sedating.  I instructed the patient to not take any of her pain medication from her tooth procedure while taking medication for her upcoming surgery.  I also discussed with the patient to not take any of the medications that will be prescribed to her prior to surgery.  Patient expressed understanding.  Patient states that she is going to take the remainder of her pain medication back to the pharmacy for proper disposal.    Patient was provided with the breast reconstruction and General Surgical Risk consent document and Pain Medication Agreement prior to their appointment.  They had adequate time to read through the risk consent documents and Pain Medication Agreement. We also discussed them in person together  during this preop appointment. All of their questions were answered to their satisfaction.  Recommended calling if they have any further questions.  Risk consent form and Pain Medication Agreement to be scanned into patient's chart.  The risks that can be encountered with and after placement of a breast expander placement were discussed and include the following but not limited to these: bleeding, infection, delayed healing, anesthesia risks, skin sensation changes, injury to structures including nerves, blood vessels, and muscles which may be temporary or permanent, allergies to tape, suture materials and glues, blood products, topical preparations or injected agents, skin contour irregularities, skin discoloration and swelling, deep vein thrombosis, cardiac and pulmonary complications, pain, which may persist, fluid accumulation, wrinkling of the skin over the expander, changes in nipple or breast sensation, expander leakage or rupture, faulty position of the expander, persistent pain, formation of tight scar tissue around the expander (capsular contracture), possible need for revisional surgery or staged procedures.   Electronically signed by: Carmesha Morocco E Quentina Fronek, PA-C 12/08/2022 10:44 AM  

## 2022-12-06 NOTE — Progress Notes (Signed)
Patient ID: Janet Fowler, female    DOB: Sep 29, 1951, 72 y.o.   MRN: [id number]  Chief Complaint  Patient presents with   Pre-op Exam      ICD-10-CM   1. Ductal carcinoma in situ (DCIS) of right breast  D05.11        History of Present Illness: Janet Fowler is a 72 y.o.  female  with a history of breast cancer.  She presents for preoperative evaluation for upcoming procedure, right breast reconstruction with placement of tissue expander, scheduled for 12/21/2022 with Dr.  Marla Roe.  Patient will also be undergoing right mastectomy and sentinel biopsy with Dr. Marlou Starks at the same time.  The patient reports that she had a tonsillectomy as a teenager and that is the only general anesthesia that she has had before.  She denied any issues with that at that time.  Patient denies any cardiac diseases.  She denies taking any blood thinners.  Patient reports she is not a smoker.  Patient denies taking any birth control or hormone replacement.  Patient denies any history of miscarriages.  Patient denies any personal history of blood clots.  Patient does report that her mom has had a blood clot in her leg before.  Patient denies any personal or family history of clotting diseases.  Patient denies any recent surgeries, traumas, infections or hospitalizations.  She denies history of stroke or heart attack.  Patient states that she does not have a port site in place.  Patient denies any history of Crohn's disease or ulcerative colitis.  She denies any history of COPD or asthma.  She denies any varicosities on her lower extremities.  She denies any recent fevers, chills or changes in her health.  Patient states that she is currently a 40 D cup.  She states that she would like to be about the same size after the reconstruction.  I discussed with the patient the limitations in regards to size.  Patient expressed understanding.  Summary of Previous Visit: Patient was seen by Dr. Marla Roe on 11/13/2022.   At this visit, patient presented for breast consultation for reconstruction.  She reported that she was diagnosed with right breast ductal carcinoma in situ, grade 2-3.  Patient reported that she was not a smoker and does not have diabetes.  Her preoperative bra size is a D/DD cup.  Patient was found to be a good candidate for immediate breast reconstruction with Flex HD and expander placement.  Job: Retired  [place] Significant for: Hypertension, right breast cancer.  Patient states the only medication she is taking regularly is Norvasc and then she takes Tylenol as needed.  Chemotherapy/radiation: Patient states that she was told that she will not need chemotherapy or radiation   Patient's blood pressure was a little elevated at her visit today.  I discussed with the patient that she should monitor her blood pressure at home.  She states that she has a blood pressure cuff at home.  I discussed with the patient that if it continues to remain elevated, she should follow-up with her PCP.  Patient expressed understanding.  Past Medical History: Allergies: No Known Allergies  Current Medications:  Current Outpatient Medications:    acetaminophen (TYLENOL) 325 MG tablet, Take 325 mg by mouth every 6 (six) hours as needed (pain.)., Disp: , Rfl:    amLODipine (NORVASC) 10 MG tablet, Take 1 tablet (10 mg total) by mouth daily., Disp: 90 tablet, Rfl: 1   penicillin v potassium (  VEETID) 500 MG tablet, Take 500 mg by mouth 4 (four) times daily., Disp: , Rfl:   Past Medical Problems: Past Medical History:  Diagnosis Date   Breast cancer ([place])    Hypertension     Past Surgical History: Past Surgical History:  Procedure Laterality Date   BREAST BIOPSY Right 10/30/2022   MM RT BREAST BX W LOC DEV 1ST LESION IMAGE BX SPEC STEREO GUIDE 10/30/2022 [facility]   BREAST BIOPSY Right 10/30/2022   MM RT BREAST BX W LOC DEV EA AD LESION IMG BX SPEC STEREO GUIDE 10/30/2022 [facility]    TONSILLECTOMY      Social History: Social History   Socioeconomic History   Marital status: Single    Spouse name: Not on file   Number of children: 0   Years of education: Not on file   Highest education level: Some college, no degree  Occupational History   Occupation: Retired Network engineer  Tobacco Use   Smoking status: Never   Smokeless tobacco: Never  Vaping Use   Vaping Use: Never used  Substance and Sexual Activity   Alcohol use: Never   Drug use: Never   Sexual activity: Not on file  Other Topics Concern   Not on file  Social History Narrative   Not on file   Social Determinants of Health   Financial Resource Strain: Not on file  Food Insecurity: Not on file  Transportation Needs: Not on file  Physical Activity: Not on file  Stress: Not on file  Social Connections: Not on file  Intimate Partner Violence: Not on file    Family History: Family History  Problem Relation Age of Onset   Hypertension Mother    Stroke Father    Hypertension Father    Colon cancer Brother 71 - 79   Prostate cancer Brother 75 - 79   Prostate cancer Brother 63 - 79    Review of Systems: Denies any recent fevers, chills or changes in her health  Physical Exam: Vital Signs BP (!) 151/84 (BP Location: Left Arm, Patient Position: Sitting, Cuff Size: Normal)   Pulse 85   Ht '5\' 2"'$  (1.575 m)   Wt 204 lb (92.5 kg)   SpO2 100%   BMI 37.31 kg/m   Physical Exam  Constitutional:      General: Not in acute distress.    Appearance: Normal appearance. Not ill-appearing.  HENT:     Head: Normocephalic and atraumatic.  Neck:     Musculoskeletal: Normal range of motion.  Cardiovascular:     Rate and Rhythm: Normal rate Pulmonary:     Effort: Pulmonary effort is normal. No respiratory distress.  Musculoskeletal: Normal range of motion.  Skin:    General: Skin is warm and dry.     Findings: No erythema or rash.  Neurological:     Mental Status: Alert and oriented to person,  place, and time. Mental status is at baseline.  Psychiatric:        Mood and Affect: Mood normal.        Behavior: Behavior normal.    Assessment/Plan: The patient is scheduled for right breast with placement of tissue expander with Dr. Marla Roe.  Risks, benefits, and alternatives of procedure discussed, questions answered and consent obtained.    Smoking Status: Non-smoker; Counseling Given?  N/A Last Mammogram: 10/23/2022; Results: BI-RADS Category 4, suspicious  Caprini Score: 10; Risk Factors include: Age, family history of thrombosis, history of malignancy, BMI > 25, and length of planned  surgery. Recommendation for mechanical and possible pharmacological prophylaxis. Encourage early ambulation.  I will discuss with Dr. Marla Roe the possibility of postoperative Lovenox.  Pictures obtained: '@consult'$   Post-op Rx sent to pharmacy: Tramadol, Zofran, Keflex, Valium -patient states that she recently had oxycodone from a tooth procedure.  She states that she was experiencing some nausea with the oxycodone.  I discussed with the patient that we can try tramadol, patient was in agreement with this.  I instructed the patient to not take tramadol and Valium at the same time as these can be sedating.  I instructed the patient to not take any of her pain medication from her tooth procedure while taking medication for her upcoming surgery.  I also discussed with the patient to not take any of the medications that will be prescribed to her prior to surgery.  Patient expressed understanding.  Patient states that she is going to take the remainder of her pain medication back to the pharmacy for proper disposal.    Patient was provided with the breast reconstruction and General Surgical Risk consent document and Pain Medication Agreement prior to their appointment.  They had adequate time to read through the risk consent documents and Pain Medication Agreement. We also discussed them in person together  during this preop appointment. All of their questions were answered to their satisfaction.  Recommended calling if they have any further questions.  Risk consent form and Pain Medication Agreement to be scanned into patient's chart.  The risks that can be encountered with and after placement of a breast expander placement were discussed and include the following but not limited to these: bleeding, infection, delayed healing, anesthesia risks, skin sensation changes, injury to structures including nerves, blood vessels, and muscles which may be temporary or permanent, allergies to tape, suture materials and glues, blood products, topical preparations or injected agents, skin contour irregularities, skin discoloration and swelling, deep vein thrombosis, cardiac and pulmonary complications, pain, which may persist, fluid accumulation, wrinkling of the skin over the expander, changes in nipple or breast sensation, expander leakage or rupture, faulty position of the expander, persistent pain, formation of tight scar tissue around the expander (capsular contracture), possible need for revisional surgery or staged procedures.   Electronically signed by: Clance Boll, PA-C 12/08/2022 10:44 AM

## 2022-12-08 ENCOUNTER — Encounter: Payer: Self-pay | Admitting: Student

## 2022-12-08 ENCOUNTER — Ambulatory Visit (INDEPENDENT_AMBULATORY_CARE_PROVIDER_SITE_OTHER): Payer: Medicare Other | Admitting: Student

## 2022-12-08 VITALS — BP 151/84 | HR 85 | Ht 62.0 in | Wt 204.0 lb

## 2022-12-08 DIAGNOSIS — D0511 Intraductal carcinoma in situ of right breast: Secondary | ICD-10-CM

## 2022-12-08 MED ORDER — TRAMADOL HCL 50 MG PO TABS: 50.0000 mg | ORAL_TABLET | Freq: Three times a day (TID) | ORAL | 0 refills | Status: DC | PRN

## 2022-12-08 MED ORDER — CEPHALEXIN 500 MG PO CAPS: 500.0000 mg | ORAL_CAPSULE | Freq: Four times a day (QID) | ORAL | 0 refills | Status: AC

## 2022-12-08 MED ORDER — ONDANSETRON HCL 4 MG PO TABS: 4.0000 mg | ORAL_TABLET | Freq: Three times a day (TID) | ORAL | 0 refills | Status: DC | PRN

## 2022-12-08 MED ORDER — DIAZEPAM 2 MG PO TABS: 2.0000 mg | ORAL_TABLET | Freq: Two times a day (BID) | ORAL | 0 refills | Status: DC | PRN

## 2022-12-11 ENCOUNTER — Other Ambulatory Visit: Payer: Self-pay | Admitting: Internal Medicine

## 2022-12-11 DIAGNOSIS — I10 Essential (primary) hypertension: Secondary | ICD-10-CM

## 2022-12-13 ENCOUNTER — Telehealth: Payer: Self-pay | Admitting: Internal Medicine

## 2022-12-13 NOTE — Telephone Encounter (Signed)
Contacted Janet Fowler to schedule their annual wellness visit. Appointment made for 01/04/23.  Janet Fowler AWV direct phone # [phone]

## 2022-12-15 NOTE — Pre-Procedure Instructions (Signed)
Surgical Instructions    Your procedure is scheduled on December 21, 2022.  Report to [facility] Main Entrance "A" at 8:30 A.M., then check in with the Admitting office.  Call this number if you have problems the morning of surgery:  [phone]  If you have any questions prior to your surgery date call [phone]: Open Monday-Friday 8am-4pm If you experience any cold or flu symptoms such as cough, fever, chills, shortness of breath, etc. between now and your scheduled surgery, please notify us at the above number.     Remember:  Do not eat after midnight the night before your surgery  You may drink clear liquids until 7:30 AM the morning of your surgery.   Clear liquids allowed are: Water, Non-Citrus Juices (without pulp), Carbonated Beverages, Clear Tea, Black Coffee Only (NO MILK, CREAM OR POWDERED CREAMER of any kind), and Gatorade.      Take these medicines the morning of surgery with A SIP OF WATER:  amLODipine (NORVASC)    May take these medicines IF NEEDED:  acetaminophen (TYLENOL)   diazepam (VALIUM)   ondansetron (ZOFRAN)   traMADol (ULTRAM)     As of today, STOP taking any Aspirin (unless otherwise instructed by your surgeon) Aleve, Naproxen, Ibuprofen, Motrin, Advil, Goody's, BC's, all herbal medications, fish oil, and all vitamins.                     Do NOT Smoke (Tobacco/Vaping) for 24 hours prior to your procedure.  If you use a CPAP at night, you may bring your mask/headgear for your overnight stay.   Contacts, glasses, piercing's, hearing aid's, dentures or partials may not be worn into surgery, please bring cases for these belongings.    For patients admitted to the hospital, discharge time will be determined by your treatment team.   Patients discharged the day of surgery will not be allowed to drive home, and someone needs to stay with them for 24 hours.  SURGICAL WAITING ROOM VISITATION Patients having surgery or a procedure may have no more than 2  support people in the waiting area - these visitors may rotate.   Children under the age of 57 must have an adult with them who is not the patient. If the patient needs to stay at the hospital during part of their recovery, the visitor guidelines for inpatient rooms apply. Pre-op nurse will coordinate an appropriate time for 1 support person to accompany patient in pre-op.  This support person may not rotate.   Please refer to the [facility] website for the visitor guidelines for Inpatients (after your surgery is over and you are in a regular room).    Special instructions:   [facility]- Preparing For Surgery  Before surgery, you can play an important role. Because skin is not sterile, your skin needs to be as free of germs as possible. You can reduce the number of germs on your skin by washing with CHG (chlorahexidine gluconate) Soap before surgery.  CHG is an antiseptic cleaner which kills germs and bonds with the skin to continue killing germs even after washing.    Oral Hygiene is also important to reduce your risk of infection.  Remember - BRUSH YOUR TEETH THE MORNING OF SURGERY WITH YOUR REGULAR TOOTHPASTE  Please do not use if you have an allergy to CHG or antibacterial soaps. If your skin becomes reddened/irritated stop using the CHG.  Do not shave (including legs and underarms) for at least 48 hours prior  to first CHG shower. It is OK to shave your face.  Please follow these instructions carefully.   Shower the NIGHT BEFORE SURGERY and the MORNING OF SURGERY  If you chose to wash your hair, wash your hair first as usual with your normal shampoo.  After you shampoo, rinse your hair and body thoroughly to remove the shampoo.  Use CHG Soap as you would any other liquid soap. You can apply CHG directly to the skin and wash gently with a scrungie or a clean washcloth.   Apply the CHG Soap to your body ONLY FROM THE NECK DOWN.  Do not use on open wounds or open sores. Avoid contact  with your eyes, ears, mouth and genitals (private parts). Wash Face and genitals (private parts)  with your normal soap.   Wash thoroughly, paying special attention to the area where your surgery will be performed.  Thoroughly rinse your body with warm water from the neck down.  DO NOT shower/wash with your normal soap after using and rinsing off the CHG Soap.  Pat yourself dry with a CLEAN TOWEL.  Wear CLEAN PAJAMAS to bed the night before surgery  Place CLEAN SHEETS on your bed the night before your surgery  DO NOT SLEEP WITH PETS.   Day of Surgery: Take a shower with CHG soap. Do not wear jewelry or makeup Do not wear lotions, powders, perfumes/colognes, or deodorant. Do not shave 48 hours prior to surgery.  Men may shave face and neck. Do not bring valuables to the hospital.  [facility] is not responsible for any belongings or valuables. Do not wear nail polish, gel polish, artificial nails, or any other type of covering on natural nails (fingers and toes) If you have artificial nails or gel coating that need to be removed by a nail salon, please have this removed prior to surgery. Artificial nails or gel coating may interfere with anesthesia's ability to adequately monitor your vital signs.  Wear Clean/Comfortable clothing the morning of surgery Remember to brush your teeth WITH YOUR REGULAR TOOTHPASTE.   Please read over the following fact sheets that you were given.    If you received a COVID test during your pre-op visit  it is requested that you wear a mask when out in public, stay away from anyone that may not be feeling well and notify your surgeon if you develop symptoms. If you have been in contact with anyone that has tested positive in the last 10 days please notify you surgeon.

## 2022-12-18 ENCOUNTER — Encounter (HOSPITAL_COMMUNITY)
Admission: RE | Admit: 2022-12-18 | Discharge: 2022-12-18 | Disposition: A | Discharge status: Home / Self Care | Payer: Medicare Other | Source: Ambulatory Visit | Attending: General Surgery | Admitting: General Surgery

## 2022-12-18 ENCOUNTER — Encounter (HOSPITAL_COMMUNITY): Payer: Self-pay | Admitting: General Surgery

## 2022-12-18 ENCOUNTER — Other Ambulatory Visit: Payer: Self-pay

## 2022-12-18 DIAGNOSIS — Z01812 Encounter for preprocedural laboratory examination: Secondary | ICD-10-CM | POA: Insufficient documentation

## 2022-12-18 DIAGNOSIS — I2510 Atherosclerotic heart disease of native coronary artery without angina pectoris: Secondary | ICD-10-CM | POA: Diagnosis not present

## 2022-12-18 LAB — BASIC METABOLIC PANEL
Anion gap: 7 (ref 5–15)
BUN: 9 mg/dL (ref 8–23)
CO2: 24 mmol/L (ref 22–32)
Calcium: 9.3 mg/dL (ref 8.9–10.3)
Chloride: 104 mmol/L (ref 98–111)
Creatinine, Ser: 0.83 mg/dL (ref 0.44–1.00)
GFR, Estimated: >60 mL/min (ref >60)
Glucose, Bld: 97 mg/dL (ref 70–99)
Potassium: 4 mmol/L (ref 3.5–5.1)
Sodium: 135 mmol/L (ref 135–145)

## 2022-12-18 LAB — CBC
HCT: 43.6 % (ref 36.0–46.0)
Hemoglobin: 14.4 g/dL (ref 12.0–15.0)
MCH: 29.2 pg (ref 26.0–34.0)
MCHC: 33 g/dL (ref 30.0–36.0)
MCV: 88.4 fL (ref 80.0–100.0)
Platelets: 306 10*3/uL (ref 150–400)
RBC: 4.93 MIL/uL (ref 3.87–5.11)
RDW: 12.3 % (ref 11.5–15.5)
WBC: 4.4 10*3/uL (ref 4.0–10.5)
nRBC: 0 % (ref 0.0–0.2)

## 2022-12-18 NOTE — Progress Notes (Signed)
PCP - Dr. Ladell Pier Cardiologist - Denies  PPM/ICD - Denies Device Orders - n/a Rep Notified - n/a  Chest x-ray - n/a EKG - 04/21/2022 Stress Test - Denies ECHO - Denies Cardiac Cath - Denies  Sleep Study - Denies CPAP - n/a  No DM  Last dose of GLP1 agonist- n/a GLP1 instructions: n/a  Blood Thinner Instructions: n/a Aspirin Instructions: n/a  ERAS Protcol - Clear liquids until 0930 morning of surgery PRE-SURGERY Ensure or G2- n/a  COVID TEST- n/a   Anesthesia review: No.   Patient denies shortness of breath, fever, cough and chest pain at PAT appointment. Pt does endorse a runny nose which she attributes to seasonal allergies and change in seasons. No cough or fever associated with these symptoms. If she were to develop a cough, she was instructed to let us know as soon as possible. Pt understood instructions.   All instructions explained to the patient, with a verbal understanding of the material. Patient agrees to go over the instructions while at home for a better understanding. Patient also instructed to self quarantine after being tested for COVID-19. The opportunity to ask questions was provided.

## 2022-12-21 ENCOUNTER — Other Ambulatory Visit: Payer: Self-pay

## 2022-12-21 ENCOUNTER — Encounter (HOSPITAL_COMMUNITY): Payer: Self-pay | Admitting: General Surgery

## 2022-12-21 ENCOUNTER — Ambulatory Visit (HOSPITAL_BASED_OUTPATIENT_CLINIC_OR_DEPARTMENT_OTHER): Payer: Medicare Other

## 2022-12-21 ENCOUNTER — Ambulatory Visit (HOSPITAL_COMMUNITY): Payer: Medicare Other

## 2022-12-21 ENCOUNTER — Encounter (HOSPITAL_COMMUNITY)
Admission: RE | Disposition: A | Discharge status: Home / Self Care | Payer: Self-pay | Source: Home / Self Care | Attending: Plastic Surgery

## 2022-12-21 ENCOUNTER — Observation Stay (HOSPITAL_COMMUNITY)
Admission: RE | Admit: 2022-12-21 | Discharge: 2022-12-21 | Disposition: A | Discharge status: Home / Self Care | Payer: Medicare Other | Source: Ambulatory Visit | Attending: General Surgery | Admitting: General Surgery

## 2022-12-21 ENCOUNTER — Observation Stay (HOSPITAL_COMMUNITY)
Admission: RE | Admit: 2022-12-21 | Discharge: 2022-12-22 | Disposition: A | Discharge status: Home / Self Care | Payer: Medicare Other | Attending: Plastic Surgery | Admitting: Plastic Surgery

## 2022-12-21 DIAGNOSIS — D0511 Intraductal carcinoma in situ of right breast: Principal | ICD-10-CM | POA: Insufficient documentation

## 2022-12-21 DIAGNOSIS — C50919 Malignant neoplasm of unspecified site of unspecified female breast: Secondary | ICD-10-CM | POA: Diagnosis present

## 2022-12-21 DIAGNOSIS — N641 Fat necrosis of breast: Secondary | ICD-10-CM | POA: Diagnosis not present

## 2022-12-21 DIAGNOSIS — Z421 Encounter for breast reconstruction following mastectomy: Secondary | ICD-10-CM | POA: Diagnosis not present

## 2022-12-21 DIAGNOSIS — Z79899 Other long term (current) drug therapy: Secondary | ICD-10-CM | POA: Insufficient documentation

## 2022-12-21 DIAGNOSIS — N6489 Other specified disorders of breast: Secondary | ICD-10-CM | POA: Diagnosis not present

## 2022-12-21 DIAGNOSIS — I10 Essential (primary) hypertension: Secondary | ICD-10-CM | POA: Insufficient documentation

## 2022-12-21 DIAGNOSIS — I2510 Atherosclerotic heart disease of native coronary artery without angina pectoris: Secondary | ICD-10-CM

## 2022-12-21 DIAGNOSIS — G8918 Other acute postprocedural pain: Secondary | ICD-10-CM | POA: Diagnosis not present

## 2022-12-21 HISTORY — PX: BREAST RECONSTRUCTION WITH PLACEMENT OF TISSUE EXPANDER AND FLEX HD (ACELLULAR HYDRATED DERMIS): SHX6295

## 2022-12-21 HISTORY — PX: TOTAL MASTECTOMY: SHX6129

## 2022-12-21 HISTORY — PX: SENTINEL NODE BIOPSY: SHX6608

## 2022-12-21 SURGERY — MASTECTOMY, SIMPLE
Anesthesia: General | Site: Breast | Laterality: Right

## 2022-12-21 MED ORDER — ROCURONIUM BROMIDE 10 MG/ML (PF) SYRINGE
PREFILLED_SYRINGE | INTRAVENOUS | Status: DC | PRN
  Administered 2022-12-21: 70 mg via INTRAVENOUS

## 2022-12-21 MED ORDER — CHLORHEXIDINE GLUCONATE 0.12 % MT SOLN
OROMUCOSAL | Status: AC
  Administered 2022-12-21: 15 mL via OROMUCOSAL
  Filled 2022-12-21: qty 15

## 2022-12-21 MED ORDER — ZOLPIDEM TARTRATE 5 MG PO TABS: 5.0000 mg | ORAL_TABLET | Freq: Every evening | ORAL | Status: DC | PRN

## 2022-12-21 MED ORDER — HYDROMORPHONE HCL 1 MG/ML IJ SOLN
INTRAMUSCULAR | Status: AC
  Filled 2022-12-21: qty 1

## 2022-12-21 MED ORDER — SENNA 8.6 MG PO TABS
1.0000 | ORAL_TABLET | Freq: Two times a day (BID) | ORAL | Status: DC
  Administered 2022-12-21 – 2022-12-22 (×2): 8.6 mg via ORAL
  Filled 2022-12-21 (×2): qty 1

## 2022-12-21 MED ORDER — ACETAMINOPHEN 325 MG PO TABS
325.0000 mg | ORAL_TABLET | Freq: Four times a day (QID) | ORAL | Status: DC
  Administered 2022-12-22 (×2): 325 mg via ORAL
  Filled 2022-12-21 (×2): qty 1

## 2022-12-21 MED ORDER — CHLORHEXIDINE GLUCONATE CLOTH 2 % EX PADS: 6.0000 | MEDICATED_PAD | Freq: Once | CUTANEOUS | Status: DC

## 2022-12-21 MED ORDER — POLYETHYLENE GLYCOL 3350 17 G PO PACK: 17.0000 g | PACK | Freq: Every day | ORAL | Status: DC | PRN

## 2022-12-21 MED ORDER — ONDANSETRON 4 MG PO TBDP: 4.0000 mg | ORAL_TABLET | Freq: Four times a day (QID) | ORAL | Status: DC | PRN

## 2022-12-21 MED ORDER — PROPOFOL 10 MG/ML IV BOLUS
INTRAVENOUS | Status: AC
  Filled 2022-12-21: qty 20

## 2022-12-21 MED ORDER — EPHEDRINE SULFATE-NACL 50-0.9 MG/10ML-% IV SOSY
PREFILLED_SYRINGE | INTRAVENOUS | Status: DC | PRN
  Administered 2022-12-21: 5 mg via INTRAVENOUS

## 2022-12-21 MED ORDER — DIPHENHYDRAMINE HCL 12.5 MG/5ML PO ELIX: 12.5000 mg | ORAL_SOLUTION | Freq: Four times a day (QID) | ORAL | Status: DC | PRN

## 2022-12-21 MED ORDER — SUGAMMADEX SODIUM 200 MG/2ML IV SOLN
INTRAVENOUS | Status: DC | PRN
  Administered 2022-12-21: 200 mg via INTRAVENOUS

## 2022-12-21 MED ORDER — BUPIVACAINE LIPOSOME 1.3 % IJ SUSP
INTRAMUSCULAR | Status: DC | PRN
  Administered 2022-12-21: 10 mL via PERINEURAL

## 2022-12-21 MED ORDER — SENNA 8.6 MG PO TABS: 1.0000 | ORAL_TABLET | Freq: Two times a day (BID) | ORAL | Status: DC

## 2022-12-21 MED ORDER — FENTANYL CITRATE (PF) 100 MCG/2ML IJ SOLN: 100.0000 ug | Freq: Once | INTRAMUSCULAR | Status: DC

## 2022-12-21 MED ORDER — ACETAMINOPHEN 500 MG PO TABS
ORAL_TABLET | ORAL | Status: AC
  Administered 2022-12-21: 1000 mg via ORAL
  Filled 2022-12-21: qty 2

## 2022-12-21 MED ORDER — METHOCARBAMOL 1000 MG/10ML IJ SOLN: 500.0000 mg | Freq: Three times a day (TID) | INTRAVENOUS | Status: DC | PRN

## 2022-12-21 MED ORDER — CEFAZOLIN SODIUM-DEXTROSE 2-4 GM/100ML-% IV SOLN
2.0000 g | INTRAVENOUS | Status: AC
  Administered 2022-12-21: 2 g via INTRAVENOUS

## 2022-12-21 MED ORDER — TECHNETIUM TC 99M TILMANOCEPT KIT
1.0000 | PACK | Freq: Once | INTRAVENOUS | Status: AC | PRN
  Administered 2022-12-21: 1 via INTRADERMAL

## 2022-12-21 MED ORDER — DIAZEPAM 2 MG PO TABS: 2.0000 mg | ORAL_TABLET | Freq: Two times a day (BID) | ORAL | Status: DC | PRN

## 2022-12-21 MED ORDER — FENTANYL CITRATE (PF) 100 MCG/2ML IJ SOLN
INTRAMUSCULAR | Status: AC
  Filled 2022-12-21: qty 2

## 2022-12-21 MED ORDER — PROPOFOL 10 MG/ML IV BOLUS
INTRAVENOUS | Status: DC | PRN
  Administered 2022-12-21: 180 ug via INTRAVENOUS

## 2022-12-21 MED ORDER — ONDANSETRON HCL 4 MG/2ML IJ SOLN: 4.0000 mg | Freq: Four times a day (QID) | INTRAMUSCULAR | Status: DC | PRN

## 2022-12-21 MED ORDER — HYDROMORPHONE HCL 1 MG/ML IJ SOLN
0.2500 mg | INTRAMUSCULAR | Status: DC | PRN
  Administered 2022-12-21: 0.25 mg via INTRAVENOUS
  Administered 2022-12-21: 0.5 mg via INTRAVENOUS

## 2022-12-21 MED ORDER — FENTANYL CITRATE (PF) 250 MCG/5ML IJ SOLN
INTRAMUSCULAR | Status: AC
  Filled 2022-12-21: qty 5

## 2022-12-21 MED ORDER — OXYCODONE HCL 5 MG PO TABS: 5.0000 mg | ORAL_TABLET | Freq: Once | ORAL | Status: DC | PRN

## 2022-12-21 MED ORDER — LIDOCAINE 2% (20 MG/ML) 5 ML SYRINGE
INTRAMUSCULAR | Status: AC
  Filled 2022-12-21: qty 5

## 2022-12-21 MED ORDER — CHLORHEXIDINE GLUCONATE 0.12 % MT SOLN: 15.0000 mL | Freq: Once | OROMUCOSAL | Status: AC

## 2022-12-21 MED ORDER — GABAPENTIN 300 MG PO CAPS: 300.0000 mg | ORAL_CAPSULE | ORAL | Status: AC

## 2022-12-21 MED ORDER — ONDANSETRON HCL 4 MG/2ML IJ SOLN
INTRAMUSCULAR | Status: DC | PRN
  Administered 2022-12-21: 4 mg via INTRAVENOUS

## 2022-12-21 MED ORDER — LIDOCAINE 2% (20 MG/ML) 5 ML SYRINGE
INTRAMUSCULAR | Status: DC | PRN
  Administered 2022-12-21: 40 mg via INTRAVENOUS

## 2022-12-21 MED ORDER — METHOCARBAMOL 500 MG PO TABS: 500.0000 mg | ORAL_TABLET | Freq: Three times a day (TID) | ORAL | Status: DC | PRN

## 2022-12-21 MED ORDER — DIPHENHYDRAMINE HCL 50 MG/ML IJ SOLN: 12.5000 mg | Freq: Four times a day (QID) | INTRAMUSCULAR | Status: DC | PRN

## 2022-12-21 MED ORDER — BUPIVACAINE HCL (PF) 0.5 % IJ SOLN
INTRAMUSCULAR | Status: DC | PRN
  Administered 2022-12-21: 20 mL via PERINEURAL

## 2022-12-21 MED ORDER — ONDANSETRON HCL 4 MG/2ML IJ SOLN: 4.0000 mg | Freq: Once | INTRAMUSCULAR | Status: DC | PRN

## 2022-12-21 MED ORDER — CEFAZOLIN SODIUM-DEXTROSE 2-4 GM/100ML-% IV SOLN: 2.0000 g | Freq: Three times a day (TID) | INTRAVENOUS | Status: DC

## 2022-12-21 MED ORDER — AMISULPRIDE (ANTIEMETIC) 5 MG/2ML IV SOLN: 10.0000 mg | Freq: Once | INTRAVENOUS | Status: DC | PRN

## 2022-12-21 MED ORDER — HYDROCODONE-ACETAMINOPHEN 5-325 MG PO TABS: 1.0000 | ORAL_TABLET | ORAL | Status: DC | PRN

## 2022-12-21 MED ORDER — ORAL CARE MOUTH RINSE: 15.0000 mL | Freq: Once | OROMUCOSAL | Status: AC

## 2022-12-21 MED ORDER — MIDAZOLAM HCL 2 MG/2ML IJ SOLN
INTRAMUSCULAR | Status: AC
  Filled 2022-12-21: qty 2

## 2022-12-21 MED ORDER — OXYCODONE HCL 5 MG/5ML PO SOLN: 5.0000 mg | Freq: Once | ORAL | Status: DC | PRN

## 2022-12-21 MED ORDER — ROCURONIUM BROMIDE 10 MG/ML (PF) SYRINGE
PREFILLED_SYRINGE | INTRAVENOUS | Status: AC
  Filled 2022-12-21: qty 10

## 2022-12-21 MED ORDER — IBUPROFEN 400 MG PO TABS
400.0000 mg | ORAL_TABLET | Freq: Four times a day (QID) | ORAL | Status: DC
  Administered 2022-12-21 – 2022-12-22 (×3): 400 mg via ORAL
  Filled 2022-12-21 (×3): qty 1

## 2022-12-21 MED ORDER — HYDROMORPHONE HCL 1 MG/ML IJ SOLN: 1.0000 mg | INTRAMUSCULAR | Status: DC | PRN

## 2022-12-21 MED ORDER — CEFAZOLIN SODIUM-DEXTROSE 2-4 GM/100ML-% IV SOLN
INTRAVENOUS | Status: AC
  Filled 2022-12-21: qty 100

## 2022-12-21 MED ORDER — SODIUM CHLORIDE (PF) 0.9 % IJ SOLN
INTRAMUSCULAR | Status: AC
  Filled 2022-12-21: qty 50

## 2022-12-21 MED ORDER — LIDOCAINE-EPINEPHRINE 1 %-1:100000 IJ SOLN
INTRAMUSCULAR | Status: AC
  Filled 2022-12-21: qty 1

## 2022-12-21 MED ORDER — SORBITOL 70 % SOLN: 30.0000 mL | Freq: Every day | Status: DC | PRN

## 2022-12-21 MED ORDER — ACETAMINOPHEN 500 MG PO TABS: 1000.0000 mg | ORAL_TABLET | ORAL | Status: AC

## 2022-12-21 MED ORDER — KCL IN DEXTROSE-NACL 20-5-0.45 MEQ/L-%-% IV SOLN
INTRAVENOUS | Status: DC
  Filled 2022-12-21 (×2): qty 1000

## 2022-12-21 MED ORDER — DEXAMETHASONE SODIUM PHOSPHATE 10 MG/ML IJ SOLN
INTRAMUSCULAR | Status: DC | PRN
  Administered 2022-12-21: 5 mg via INTRAVENOUS

## 2022-12-21 MED ORDER — ACETAMINOPHEN 325 MG PO TABS: 325.0000 mg | ORAL_TABLET | Freq: Four times a day (QID) | ORAL | Status: DC

## 2022-12-21 MED ORDER — LACTATED RINGERS IV SOLN: INTRAVENOUS | Status: DC

## 2022-12-21 MED ORDER — MIDAZOLAM HCL 5 MG/5ML IJ SOLN
INTRAMUSCULAR | Status: DC | PRN
  Administered 2022-12-21: 2 mg via INTRAVENOUS

## 2022-12-21 MED ORDER — KCL IN DEXTROSE-NACL 20-5-0.45 MEQ/L-%-% IV SOLN: INTRAVENOUS | Status: DC

## 2022-12-21 MED ORDER — PHENYLEPHRINE 80 MCG/ML (10ML) SYRINGE FOR IV PUSH (FOR BLOOD PRESSURE SUPPORT)
PREFILLED_SYRINGE | INTRAVENOUS | Status: DC | PRN
  Administered 2022-12-21 (×4): 80 ug via INTRAVENOUS

## 2022-12-21 MED ORDER — FENTANYL CITRATE (PF) 250 MCG/5ML IJ SOLN
INTRAMUSCULAR | Status: DC | PRN
  Administered 2022-12-21: 100 ug via INTRAVENOUS

## 2022-12-21 MED ORDER — IBUPROFEN 400 MG PO TABS: 400.0000 mg | ORAL_TABLET | Freq: Four times a day (QID) | ORAL | Status: DC

## 2022-12-21 MED ORDER — CEFAZOLIN SODIUM-DEXTROSE 2-4 GM/100ML-% IV SOLN: 2.0000 g | INTRAVENOUS | Status: DC

## 2022-12-21 MED ORDER — GABAPENTIN 300 MG PO CAPS
ORAL_CAPSULE | ORAL | Status: AC
  Administered 2022-12-21: 300 mg via ORAL
  Filled 2022-12-21: qty 1

## 2022-12-21 MED ORDER — CEFAZOLIN SODIUM-DEXTROSE 1-4 GM/50ML-% IV SOLN
1.0000 g | Freq: Three times a day (TID) | INTRAVENOUS | Status: DC
  Administered 2022-12-21 – 2022-12-22 (×2): 1 g via INTRAVENOUS
  Filled 2022-12-21 (×3): qty 50

## 2022-12-21 SURGICAL SUPPLY — 66 items
ADH SKN CLS APL DERMABOND .7 (GAUZE/BANDAGES/DRESSINGS) ×4
APL PRP STRL LF DISP 70% ISPRP (MISCELLANEOUS) ×6
APPLIER CLIP 9.375 MED OPEN (MISCELLANEOUS) ×2
APR CLP MED 9.3 20 MLT OPN (MISCELLANEOUS) ×2
BAG COUNTER SPONGE SURGICOUNT (BAG) ×4 IMPLANT
BAG DECANTER FOR FLEXI CONT (MISCELLANEOUS) ×2 IMPLANT
BAG SPNG CNTER NS LX DISP (BAG) ×4
BINDER BREAST LRG (GAUZE/BANDAGES/DRESSINGS) IMPLANT
BINDER BREAST XLRG (GAUZE/BANDAGES/DRESSINGS) IMPLANT
BINDER BREAST XXLRG (GAUZE/BANDAGES/DRESSINGS) IMPLANT
BIOPATCH BLUE 3/4IN DISK W/1.5 (GAUZE/BANDAGES/DRESSINGS) IMPLANT
BIOPATCH RED 1 DISK 7.0 (GAUZE/BANDAGES/DRESSINGS) ×8 IMPLANT
CANISTER SUCT 3000ML PPV (MISCELLANEOUS) ×4 IMPLANT
CHLORAPREP W/TINT 26 (MISCELLANEOUS) ×4 IMPLANT
CLIP APPLIE 9.375 MED OPEN (MISCELLANEOUS) ×2 IMPLANT
COVER SURGICAL LIGHT HANDLE (MISCELLANEOUS) ×4 IMPLANT
DERMABOND ADVANCED .7 DNX12 (GAUZE/BANDAGES/DRESSINGS) ×4 IMPLANT
DEVICE DSSCT PLSMBLD 3.0S LGHT (MISCELLANEOUS) ×2 IMPLANT
DRAIN CHANNEL 19F RND (DRAIN) ×4 IMPLANT
DRAPE HALF SHEET 40X57 (DRAPES) ×2 IMPLANT
DRAPE LAPAROSCOPIC ABDOMINAL (DRAPES) ×2 IMPLANT
DRAPE ORTHO SPLIT 77X108 STRL (DRAPES) ×4
DRAPE SURG 17X23 STRL (DRAPES) ×8 IMPLANT
DRAPE SURG ORHT 6 SPLT 77X108 (DRAPES) ×4 IMPLANT
DRAPE WARM FLUID 44X44 (DRAPES) ×2 IMPLANT
DRSG MEPILEX POST OP 4X12 (GAUZE/BANDAGES/DRESSINGS) IMPLANT
DRSG MEPILEX POST OP 4X8 (GAUZE/BANDAGES/DRESSINGS) ×4 IMPLANT
ELECT BLADE 4.0 EZ CLEAN MEGAD (MISCELLANEOUS)
ELECT REM PT RETURN 9FT ADLT (ELECTROSURGICAL) ×4
ELECTRODE BLDE 4.0 EZ CLN MEGD (MISCELLANEOUS) IMPLANT
ELECTRODE REM PT RTRN 9FT ADLT (ELECTROSURGICAL) ×4 IMPLANT
EVACUATOR SILICONE 100CC (DRAIN) ×4 IMPLANT
GAUZE PAD ABD 8X10 STRL (GAUZE/BANDAGES/DRESSINGS) ×8 IMPLANT
GAUZE SPONGE 4X4 12PLY STRL (GAUZE/BANDAGES/DRESSINGS) ×4 IMPLANT
GLOVE BIO SURGEON STRL SZ 6.5 (GLOVE) ×4 IMPLANT
GLOVE BIO SURGEON STRL SZ7.5 (GLOVE) ×2 IMPLANT
GOWN STRL REUS W/ TWL LRG LVL3 (GOWN DISPOSABLE) ×8 IMPLANT
GOWN STRL REUS W/TWL LRG LVL3 (GOWN DISPOSABLE) ×8
IMPL EXPANDER BREAST 650CC (Breast) IMPLANT
IMPLANT EXPANDER BREAST 650CC (Breast) ×2 IMPLANT
KIT BASIN OR (CUSTOM PROCEDURE TRAY) ×4 IMPLANT
KIT TURNOVER KIT B (KITS) ×4 IMPLANT
LIGHT WAVEGUIDE WIDE FLAT (MISCELLANEOUS) IMPLANT
NS IRRIG 1000ML POUR BTL (IV SOLUTION) ×6 IMPLANT
PACK GENERAL/GYN (CUSTOM PROCEDURE TRAY) ×4 IMPLANT
PAD ARMBOARD 7.5X6 YLW CONV (MISCELLANEOUS) ×4 IMPLANT
PIN SAFETY STERILE (MISCELLANEOUS) ×2 IMPLANT
PLASMABLADE 3.0S W/LIGHT (MISCELLANEOUS) ×2
SET ASEPTIC TRANSFER (MISCELLANEOUS) IMPLANT
SPECIMEN JAR X LARGE (MISCELLANEOUS) ×2 IMPLANT
SUT ETHILON 3 0 FSL (SUTURE) ×2 IMPLANT
SUT MNCRL AB 4-0 PS2 18 (SUTURE) ×4 IMPLANT
SUT MON AB 3-0 SH 27 (SUTURE) ×6
SUT MON AB 3-0 SH27 (SUTURE) ×4 IMPLANT
SUT MON AB 4-0 PC3 18 (SUTURE) ×2 IMPLANT
SUT MON AB 5-0 PS2 18 (SUTURE) ×4 IMPLANT
SUT PDS AB 2-0 CT1 27 (SUTURE) ×8 IMPLANT
SUT SILK 4 0 PS 2 (SUTURE) ×2 IMPLANT
SUT VIC AB 3-0 54X BRD REEL (SUTURE) IMPLANT
SUT VIC AB 3-0 BRD 54 (SUTURE)
SUT VIC AB 3-0 SH 18 (SUTURE) ×2 IMPLANT
TISSUE FLEXHD PERF PLIAB 8X16 (Tissue) IMPLANT
TOWEL GREEN STERILE (TOWEL DISPOSABLE) ×4 IMPLANT
TOWEL GREEN STERILE FF (TOWEL DISPOSABLE) ×4 IMPLANT
TRAY FOLEY MTR SLVR 14FR STAT (SET/KITS/TRAYS/PACK) IMPLANT
TUBE CONNECTING 12X1/4 (SUCTIONS) ×2 IMPLANT

## 2022-12-21 NOTE — H&P (Signed)
  REFERRING PHYSICIAN: Truitt Merle, MD PROVIDER: Landry Corporal, MD MRN: [id number] DOB: [birth year] Subjective   Chief Complaint: Breast Cancer  History of Present Illness: Janet Fowler is a 72 y.o. female who is seen today as an office consultation for evaluation of Breast Cancer  We are asked to see the patient in consultation by Dr. Burr Medico to evaluate her for a new right breast cancer. The patient is a 72 year old black female who recently went for a routine screening mammogram. At that time she was found to have calcifications in the lower outer quadrant of the right breast measuring 9.1 cm. The axilla looked normal. The calcs were biopsied in 2 places and came back as intermediate grade ductal carcinoma in situ that was ER and PR positive.  Review of Systems: A complete review of systems was obtained from the patient. I have reviewed this information and discussed as appropriate with the patient. See HPI as well for other ROS.  ROS   Medical History: Past Medical History:  Diagnosis Date  Hypertension   Patient Active Problem List  Diagnosis  Ductal carcinoma in situ (DCIS) of right breast  Essential hypertension  Mixed hyperlipidemia  Other malformations of cerebral vessels  Thyroid nodule  Hearing loss  Nervously anxious   Past Surgical History:  Procedure Laterality Date  .R breast biopsy Right 10/30/2022  TONSILLECTOMY N/A  Unknown date    No Known Allergies  Current Outpatient Medications on File Prior to Visit  Medication Sig Dispense Refill  acetaminophen (TYLENOL) 325 MG tablet Take 325 mg by mouth every 6 (six) hours as needed for Pain  amLODIPine (NORVASC) 10 MG tablet Take 1 tablet by mouth once daily   No current facility-administered medications on file prior to visit.   Family History  Problem Relation Age of Onset  Deep vein thrombosis (DVT or abnormal blood clot formation) Mother  High blood pressure (Hypertension) Mother  High blood pressure  (Hypertension) Father  Stroke Father    Social History   Tobacco Use  Smoking Status Never  Smokeless Tobacco Never    Social History   Socioeconomic History  Marital status: Single  Tobacco Use  Smoking status: Never  Smokeless tobacco: Never  Vaping Use  Vaping Use: Never used  Substance and Sexual Activity  Alcohol use: Never  Drug use: Never   Objective:  There were no vitals filed for this visit.  There is no height or weight on file to calculate BMI.  Physical Exam   The exam could not be performed as this was a A/V meeting over the computer  Labs, Imaging and Diagnostic Testing:  Assessment and Plan:   Diagnoses and all orders for this visit:  Ductal carcinoma in situ (DCIS) of right breast   The patient appears to have a 9.1 cm area of ductal carcinoma in situ in the right breast. Because of the large area involved my recommendation for management would be mastectomy with sentinel node biopsy. She would like to consider reconstruction. I have discussed with her in detail the risks and benefits of the operation as well as some of the technical aspects and she understands and wishes to proceed. We will refer her to Dr. Marla Roe in plastic surgery to discuss reconstruction and we will move forward with surgical scheduling. She will also meet with medical and radiation oncology to discuss adjuvant therapy.

## 2022-12-21 NOTE — Anesthesia Postprocedure Evaluation (Signed)
Anesthesia Post Note  Patient: Janet Fowler  Procedure(s) Performed: RIGHT MASTECTOMY (Right: Breast) SENTINEL NODE BIOPSY (Right) IMMEDIATE RIGHT BREAST RECONSTRUCTION WITH PLACEMENT OF TISSUE EXPANDER AND FLEX HD (ACELLULAR HYDRATED DERMIS) (Right)     Patient location during evaluation: PACU Anesthesia Type: General Level of consciousness: awake and alert and oriented Pain management: pain level controlled Vital Signs Assessment: post-procedure vital signs reviewed and stable Respiratory status: spontaneous breathing, nonlabored ventilation and respiratory function stable Cardiovascular status: blood pressure returned to baseline and stable Postop Assessment: no apparent nausea or vomiting Anesthetic complications: no   No notable events documented.  Last Vitals:  Vitals:   12/21/22 1430 12/21/22 1445  BP: (!) 150/71 133/71  Pulse: 82 83  Resp: (!) 22 17  Temp:    SpO2: 100% 95%    Last Pain:  Vitals:   12/21/22 1345  TempSrc:   PainSc: Asleep                 Lennie Vasco A.

## 2022-12-21 NOTE — Interval H&P Note (Signed)
History and Physical Interval Note:  12/21/2022 9:37 AM  Janet Fowler  has presented today for surgery, with the diagnosis of RIGHT BREAST DCIS.  The various methods of treatment have been discussed with the patient and family. After consideration of risks, benefits and other options for treatment, the patient has consented to  Procedure(s) with comments: RIGHT MASTECTOMY (Right) - GEN & PEC BLOCK SENTINEL NODE BIOPSY (Right) IMMEDIATE RIGHT BREAST RECONSTRUCTION WITH PLACEMENT OF TISSUE EXPANDER AND FLEX HD (ACELLULAR HYDRATED DERMIS) (Right) as a surgical intervention.  The patient's history has been reviewed, patient examined, no change in status, stable for surgery.  I have reviewed the patient's chart and labs.  Questions were answered to the patient's satisfaction.     Loel Lofty Mehak Roskelley

## 2022-12-21 NOTE — Interval H&P Note (Signed)
History and Physical Interval Note:  12/21/2022 9:54 AM  Cindy Hazy  has presented today for surgery, with the diagnosis of RIGHT BREAST DCIS.  The various methods of treatment have been discussed with the patient and family. After consideration of risks, benefits and other options for treatment, the patient has consented to  Procedure(s) with comments: RIGHT MASTECTOMY (Right) - GEN & PEC BLOCK SENTINEL NODE BIOPSY (Right) IMMEDIATE RIGHT BREAST RECONSTRUCTION WITH PLACEMENT OF TISSUE EXPANDER AND FLEX HD (ACELLULAR HYDRATED DERMIS) (Right) as a surgical intervention.  The patient's history has been reviewed, patient examined, no change in status, stable for surgery.  I have reviewed the patient's chart and labs.  Questions were answered to the patient's satisfaction.     Autumn Messing III

## 2022-12-21 NOTE — Discharge Instructions (Signed)

## 2022-12-21 NOTE — Anesthesia Procedure Notes (Signed)
Procedure Name: Intubation Date/Time: 12/21/2022 11:08 AM  Performed by: Elvin So, CRNAPre-anesthesia Checklist: Patient identified, Emergency Drugs available, Suction available and Patient being monitored Patient Re-evaluated:Patient Re-evaluated prior to induction Oxygen Delivery Method: Circle System Utilized Preoxygenation: Pre-oxygenation with 100% oxygen Induction Type: IV induction Ventilation: Mask ventilation without difficulty Laryngoscope Size: Mac Grade View: Grade I Tube type: Oral Tube size: 7.0 mm Number of attempts: 1 Airway Equipment and Method: Stylet and Oral airway Placement Confirmation: ETT inserted through vocal cords under direct vision, positive ETCO2 and breath sounds checked- equal and bilateral Secured at: 21 cm Tube secured with: Tape Dental Injury: Teeth and Oropharynx as per pre-operative assessment

## 2022-12-21 NOTE — Anesthesia Preprocedure Evaluation (Addendum)
Anesthesia Evaluation  Patient identified by MRN, date of birth, ID band Patient awake    Reviewed: Allergy & Precautions, NPO status , Patient's Chart, lab work & pertinent test results  Airway Mallampati: II       Dental  (+) Teeth Intact, Caps, Dental Advisory Given, Missing   Pulmonary neg pulmonary ROS   Pulmonary exam normal breath sounds clear to auscultation       Cardiovascular hypertension, Pt. on medications Normal cardiovascular exam Rhythm:Regular Rate:Normal     Neuro/Psych negative neurological ROS  negative psych ROS   GI/Hepatic negative GI ROS, Neg liver ROS,,,  Endo/Other  Hyperlipidemia Obesity DCIS right breast  Renal/GU negative Renal ROS  negative genitourinary   Musculoskeletal negative musculoskeletal ROS (+)    Abdominal  (+) + obese  Peds  Hematology negative hematology ROS (+)   Anesthesia Other Findings   Reproductive/Obstetrics                             Anesthesia Physical Anesthesia Plan  ASA: 2  Anesthesia Plan: General   Post-op Pain Management: Minimal or no pain anticipated and Regional block*   Induction: Intravenous  PONV Risk Score and Plan: 4 or greater and Treatment may vary due to age or medical condition, Ondansetron and Dexamethasone  Airway Management Planned: LMA  Additional Equipment: None  Intra-op Plan:   Post-operative Plan: Extubation in OR  Informed Consent: I have reviewed the patients History and Physical, chart, labs and discussed the procedure including the risks, benefits and alternatives for the proposed anesthesia with the patient or authorized representative who has indicated his/her understanding and acceptance.     Dental advisory given  Plan Discussed with: CRNA and Anesthesiologist  Anesthesia Plan Comments:        Anesthesia Quick Evaluation

## 2022-12-21 NOTE — Op Note (Signed)
Op report    DATE OF OPERATION:  12/21/2022  LOCATION: [facility]  SURGICAL DIVISION: Plastic Surgery  PREOPERATIVE DIAGNOSES:  1. Right Breast cancer.    POSTOPERATIVE DIAGNOSES:  1. Right Breast cancer.   PROCEDURE:  1. Right immediate breast reconstruction with placement of Acellular Dermal Matrix and tissue expanders.  SURGEON: Aadhira Heffernan Sanger Aleila Syverson, DO  ASSISTANT: Lenn Sink, PA  ANESTHESIA:  General.   COMPLICATIONS: None.   IMPLANTS: Right - Mentor 650 cc, 250 cc of injectable saline placed in the expander. Acellular Dermal Matrix 8 x 16 cm Flex HD  INDICATIONS FOR PROCEDURE:  The patient, Janet Fowler, is a 72 y.o. female born on [birth year], is here for  immediate first stage breast reconstruction with placement of right tissue expander and Acellular dermal matrix. MRN: [id number]  CONSENT:  Informed consent was obtained directly from the patient. Risks, benefits and alternatives were fully discussed. Specific risks including but not limited to bleeding, infection, hematoma, seroma, scarring, pain, implant infection, implant extrusion, capsular contracture, asymmetry, wound healing problems, and need for further surgery were all discussed. The patient did have an ample opportunity to have her questions answered to her satisfaction.   DESCRIPTION OF PROCEDURE:  The patient was taken to the operating room by the general surgery team. SCDs were placed and IV antibiotics were given. The patient's chest was prepped and draped in a sterile fashion. A time out was performed and the implants to be used were identified.  Right mastectomy was performed.  Once the general surgery team had completed their portion of the case the patient was rendered to the plastic and reconstructive surgery team.  Right:  The pectoralis major muscle was lifted from the chest wall with release of the lateral edge and lateral inframammary fold.  The pocket was irrigated with  antibiotic solution and hemostasis was achieved with electrocautery.  The ADM was then prepared according to the manufacture guidelines and slits placed to help with postoperative fluid management.  The ADM was then sutured to the inferior and lateral edge of the inframammary fold with 2-0 PDS starting with an interrupted stitch and then a running stitch.  The lateral portion was sutured to with interrupted sutures after the expander was placed.  The expander was prepared according to the manufacture guidelines, the air evacuated and then it was placed under the ADM and pectoralis major muscle.  The inferior and lateral tabs were used to secure the expander to the chest wall with 2-0 PDS.  The drain was placed at the inframammary fold over the ADM and secured to the skin with 3-0 Silk.    The deep layers were closed with 3-0 PDS followed by 3-0 Monocryl.  The skin was closed with 5-0 Monocryl and then dermabond was applied.  The ABDs and breast binder were placed.  The patient tolerated the procedure well and there were no complications.  The patient was allowed to wake from anesthesia and taken to the recovery room in satisfactory condition.   The advanced practice practitioner (APP) assisted throughout the case.  The APP was essential in retraction and counter traction when needed to make the case progress smoothly.  This retraction and assistance made it possible to see the tissue plans for the procedure.  The assistance was needed for blood control, tissue re-approximation and assisted with closure of the incision site.

## 2022-12-21 NOTE — Anesthesia Procedure Notes (Signed)
Anesthesia Regional Block: Pectoralis block   Pre-Anesthetic Checklist: , timeout performed,  Correct Patient, Correct Site, Correct Laterality,  Correct Procedure, Correct Position, site marked,  Risks and benefits discussed,  Surgical consent,  Pre-op evaluation,  At surgeon's request and post-op pain management  Laterality: Right  Prep: chloraprep       Needles:  Injection technique: Single-shot  Needle Type: Echogenic Stimulator Needle     Needle Length: 10cm  Needle Gauge: 21   Needle insertion depth: 7 cm   Additional Needles:   Procedures:,,,, ultrasound used (permanent image in chart),,    Narrative:  Start time: 12/21/2022 10:32 AM End time: 12/21/2022 10:37 AM Injection made incrementally with aspirations every 5 mL.  Performed by: Personally  Anesthesiologist: Josephine Igo, MD  Additional Notes: Timeout performed. Patient sedated. Relevant anatomy ID'd using [place]. Incremental 2-74ml injection of LA with frequent aspiration. Patient tolerated procedure well.

## 2022-12-21 NOTE — Transfer of Care (Signed)
Immediate Anesthesia Transfer of Care Note  Patient: Janet Fowler  Procedure(s) Performed: RIGHT MASTECTOMY (Right: Breast) SENTINEL NODE BIOPSY (Right) IMMEDIATE RIGHT BREAST RECONSTRUCTION WITH PLACEMENT OF TISSUE EXPANDER AND FLEX HD (ACELLULAR HYDRATED DERMIS) (Right)  Patient Location: PACU  Anesthesia Type:General  Level of Consciousness: awake and patient cooperative  Airway & Oxygen Therapy: Patient Spontanous Breathing and Patient connected to face mask oxygen  Post-op Assessment: Report given to RN, Post -op Vital signs reviewed and stable, and Patient moving all extremities  Post vital signs: Reviewed and stable  Last Vitals:  Vitals Value Taken Time  BP 110/89 12/21/22 1315  Temp    Pulse 94 12/21/22 1316  Resp 19 12/21/22 1316  SpO2 100 % 12/21/22 1316  Vitals shown include unvalidated device data.  Last Pain:  Vitals:   12/21/22 0916  TempSrc:   PainSc: 0-No pain         Complications: No notable events documented.

## 2022-12-21 NOTE — Op Note (Addendum)
12/21/2022  12:20 PM  PATIENT:  Janet Fowler  72 y.o. female  PRE-OPERATIVE DIAGNOSIS:  RIGHT BREAST DCIS  POST-OPERATIVE DIAGNOSIS:  RIGHT BREAST DCIS  PROCEDURE:  Procedure(s) with comments: RIGHT MASTECTOMY (Right) - GEN & PEC BLOCK SENTINEL NODE BIOPSY (Right)  SURGEON:  Surgeon(s) and Role: Panel 1:    * Jovita Kussmaul, MD - Primary  PHYSICIAN ASSISTANT:   ASSISTANTS: none   ANESTHESIA:   general  EBL:  minimal   BLOOD ADMINISTERED:none  DRAINS: none   LOCAL MEDICATIONS USED:  NONE  SPECIMEN:  Source of Specimen:  right mastectomy and sentinel node x 2  DISPOSITION OF SPECIMEN:  PATHOLOGY  COUNTS:  YES  TOURNIQUET:  * No tourniquets in log *  DICTATION: .Dragon Dictation  After informed consent was obtained the patient was brought to the operating room and placed in the supine position on the operating table.  After adequate induction of general anesthesia the patient's right chest, breast, and axillary area were prepped with ChloraPrep, allowed to dry, and draped in usual sterile manner.  An appropriate timeout was performed.  Earlier in the day the patient underwent injection of 1 mCi of technetium sulfur colloid in the subareolar position on the right.  An elliptical incision was then made in the skin around the nipple and areola complex in order to spare some of the skin.  The incision was carried through the skin and subcutaneous tissue sharply with the PlasmaBlade.  Breast hooks were used to elevate the skin flaps anteriorly towards the ceiling.  Thin skin flaps were then created by dissecting between the breast tissue in the subcutaneous fat and skin.  This dissection was carried circumferentially all the way to the chest wall.  Next the breast was removed from the pectoralis muscle with the pectoralis fascia.  Once this dissection was complete then the entire right breast was removed from the patient.  It was marked with a stitch on the lateral skin and sent  to pathology for further evaluation.  The neoprobe was set to technetium and 2 areas of radioactivity were readily identified in the right axilla.  The deep right axillary space was entered sharply with the plasma blade. Each of these areas was excised sharply with the PlasmaBlade and the surrounding small vessels and lymphatics were controlled with clips.  Ex vivo counts on these nodes were approximately 1000 and 3000.  No other hot or palpable nodes were identified in the right axilla.  These were sent as sentinel nodes numbers 1 and 2.  There may have been more than 1 node with each specimen.  Hemostasis was achieved using the PlasmaBlade.  The wound was then irrigated with copious amounts of saline and packed with a moistened lap sponge.  The patient was tolerating the procedure well.  All needle sponge and instrument counts were correct.  At this point the operation was turned over to Dr. Marla Roe for the reconstruction.  Her portion will be dictated separately.  PLAN OF CARE: Admit for overnight observation  PATIENT DISPOSITION:  PACU - hemodynamically stable.   Delay start of Pharmacological VTE agent (>24hrs) due to surgical blood loss or risk of bleeding: no

## 2022-12-22 ENCOUNTER — Encounter (HOSPITAL_COMMUNITY): Payer: Self-pay | Admitting: General Surgery

## 2022-12-22 ENCOUNTER — Ambulatory Visit (INDEPENDENT_AMBULATORY_CARE_PROVIDER_SITE_OTHER): Payer: Medicare Other | Admitting: Physician Assistant

## 2022-12-22 DIAGNOSIS — D0511 Intraductal carcinoma in situ of right breast: Secondary | ICD-10-CM

## 2022-12-22 DIAGNOSIS — Z79899 Other long term (current) drug therapy: Secondary | ICD-10-CM | POA: Diagnosis not present

## 2022-12-22 DIAGNOSIS — I10 Essential (primary) hypertension: Secondary | ICD-10-CM | POA: Diagnosis not present

## 2022-12-22 NOTE — Plan of Care (Signed)

## 2022-12-22 NOTE — Progress Notes (Signed)
1 Day Post-Op   Subjective/Chief Complaint: No complaints   Objective: Vital signs in last 24 hours: Temp:  [97.5 F (36.4 C)-98.3 F (36.8 C)] 98.2 F (36.8 C) (03/22 0541) Pulse Rate:  [67-96] 67 (03/22 0541) Resp:  [9-30] 17 (03/22 0541) BP: (100-152)/(61-97) 132/69 (03/22 0541) SpO2:  [83 %-100 %] 97 % (03/22 0541) Weight:  [91.2 kg] 91.2 kg (03/21 0820) Last BM Date : 12/20/22  Intake/Output from previous day: 03/21 0701 - 03/22 0700 In: 1424.1 [P.O.:240; I.V.:1184.1] Out: 76 [Urine:1; Drains:75] Intake/Output this shift: No intake/output data recorded.  General appearance: alert and cooperative Resp: clear to auscultation bilaterally Chest wall: skin flaps look good Cardio: regular rate and rhythm GI: soft, non-tender; bowel sounds normal; no masses,  no organomegaly  Lab Results:  No results for input(s): "WBC", "HGB", "HCT", "PLT" in the last 72 hours. BMET No results for input(s): "NA", "K", "CL", "CO2", "GLUCOSE", "BUN", "CREATININE", "CALCIUM" in the last 72 hours. PT/INR No results for input(s): "LABPROT", "INR" in the last 72 hours. ABG No results for input(s): "PHART", "HCO3" in the last 72 hours.  Invalid input(s): "PCO2", "PO2"  Studies/Results: NM Sentinel Node Inj-No Rpt (Breast)  Result Date: 12/21/2022 Sulfur Colloid was injected by the Nuclear Medicine Technologist for sentinel lymph node localization.    Anti-infectives: Anti-infectives (From admission, onward)    Start     Dose/Rate Route Frequency Ordered Stop   12/21/22 2000  ceFAZolin (ANCEF) IVPB 1 g/50 mL premix        1 g 100 mL/hr over 30 Minutes Intravenous Every 8 hours 12/21/22 1607 12/28/22 1959   12/21/22 1530  ceFAZolin (ANCEF) IVPB 2g/100 mL premix  Status:  Discontinued        2 g 200 mL/hr over 30 Minutes Intravenous Every 8 hours 12/21/22 1520 12/21/22 1521   12/21/22 0930  ceFAZolin (ANCEF) IVPB 2g/100 mL premix  Status:  Discontinued        2 g 200 mL/hr over 30  Minutes Intravenous On call to O.R. 12/21/22 [id number] 12/21/22 0924   12/21/22 0902  ceFAZolin (ANCEF) 2-4 GM/100ML-% IVPB       Note to Pharmacy: Gleason, Ginger E: cabinet override      12/21/22 0902 12/21/22 1112   12/21/22 0900  ceFAZolin (ANCEF) IVPB 2g/100 mL premix        2 g 200 mL/hr over 30 Minutes Intravenous On call to O.R. 12/21/22 0855 12/21/22 1112       Assessment/Plan: s/p Procedure(s) with comments: RIGHT MASTECTOMY (Right) - GEN & PEC BLOCK SENTINEL NODE BIOPSY (Right) IMMEDIATE RIGHT BREAST RECONSTRUCTION WITH PLACEMENT OF TISSUE EXPANDER AND FLEX HD (ACELLULAR HYDRATED DERMIS) (Right) Advance diet Discharge Disposition per plastics  LOS: 0 days    Janet Fowler 12/22/2022

## 2022-12-22 NOTE — Progress Notes (Signed)
This is a 72 year old female status post right-sided mastectomy with expander placement by Dr. Marla Roe. The patient is located in [place], I am currently at our office in [place].  Patient notes she is doing well, she has very minimal pain and very minimal drain output fevers..  Exam:  Phone encounter, patient speaking in full sentences no acute distress.  Overall the patient is doing very well from a postoperative standpoint, she has a scheduled follow-up on 12/29/2022 with Dr. Marla Roe.  She was given strict return precautions, she verbalized understanding and agreement to today's plan had no further questions or concerns.

## 2022-12-22 NOTE — Progress Notes (Signed)
Patient IV  removed due to infiltration and pain to site. Patient wants to know if she needs one and if she would  be discharge home today,MD informed and note to wait till  they do rounds,will continue to monitor.

## 2022-12-22 NOTE — Discharge Summary (Signed)
Physician Discharge Summary  Patient ID: Janet Fowler MRN: [id number] DOB/AGE: 74-Mar-1952 74 y.o.  Admit date: 12/21/2022 Discharge date: 12/22/2022  Admission Diagnoses:  Discharge Diagnoses:  Principal Problem:   Breast cancer [facility])   Discharged Condition: good  Hospital Course: Patient is a 72 year old female who presented to the [facility] operating room on 12/21/2022 for right mastectomy and sentinel lymph node biopsy by Dr. Marlou Starks followed by immediate breast reconstruction with placement of tissue expander and Flex HD with Dr. Marla Roe.  Patient stayed overnight for observation.  No acute overnight events.  Patient is accompanied by her family at bedside this a.m., patient reports she is doing well.  She reports pain has been well-controlled with Tylenol and ibuprofen.  She reports she has all of her postoperative medications at home.  She denies any cardiac or pulmonary symptoms, she reports positive flatus but no BM yet.  Drain output total has been 75 cc per EMR review.  Patient feels comfortable going home and is eager to leave.  Consults: None  Significant Diagnostic Studies: None  Treatments: IV hydration, antibiotics: Ancef, and analgesia: acetaminophen and ibuprofen  Discharge Exam: Blood pressure 124/62, pulse 82, temperature 98.1 F (36.7 C), temperature source Oral, resp. rate 16, height 5\' 2"  (1.575 m), weight 91.2 kg, SpO2 98 %. General appearance: alert, cooperative, no distress, and family at bedside Head: Normocephalic, without obvious abnormality, atraumatic Chest wall: No significant tenderness noted, normal rise and fall Breasts: Normal appearance of left breast.  Right breast dressings are in place, right JP drain is in place with serosanguineous drainage in bulb.  Mild postoperative swelling is noted, no subcutaneous fluid collection or hematoma noted on exam.  Minimal tenderness with palpation of right breast. Extremities: extremities normal,  atraumatic, no cyanosis or edema, SCDs in place.  Disposition: Discharge disposition: 01-Home or Self Care       Discharge Instructions     Call MD for:  difficulty breathing, headache or visual disturbances   Complete by: As directed    Call MD for:  extreme fatigue   Complete by: As directed    Call MD for:  hives   Complete by: As directed    Call MD for:  persistant dizziness or light-headedness   Complete by: As directed    Call MD for:  persistant nausea and vomiting   Complete by: As directed    Call MD for:  redness, tenderness, or signs of infection (pain, swelling, redness, odor or green/yellow discharge around incision site)   Complete by: As directed    Call MD for:  severe uncontrolled pain   Complete by: As directed    Call MD for:  temperature >100.4   Complete by: As directed    Diet - low sodium heart healthy   Complete by: As directed    Increase activity slowly   Complete by: As directed       Allergies as of 12/22/2022   No Known Allergies      Medication List     TAKE these medications    acetaminophen 325 MG tablet Commonly known as: TYLENOL Take 325 mg by mouth every 6 (six) hours as needed (pain.).   amLODipine 10 MG tablet Commonly known as: NORVASC Take 1 tablet (10 mg total) by mouth daily.   diazepam 2 MG tablet Commonly known as: Valium Take 1 tablet (2 mg total) by mouth every 12 (twelve) hours as needed for up to 20 doses for muscle spasms.  ondansetron 4 MG tablet Commonly known as: Zofran Take 1 tablet (4 mg total) by mouth every 8 (eight) hours as needed for up to 20 doses for nausea or vomiting.   traMADol 50 MG tablet Commonly known as: ULTRAM Take 1 tablet (50 mg total) by mouth every 8 (eight) hours as needed for up to 20 doses for moderate pain or severe pain.        Follow-up Information     Autumn Messing III, MD Follow up in 2 week(s).   Specialty: General Surgery Contact information: [address]  [place] [address] [phone]         Wallace Going, DO Follow up in 10 day(s).   Specialty: Plastic Surgery Contact information: [address] [place] [address] [facility] [address] [place], [place] [address] [phone]  Signed: Carola Rhine Lus Kriegel 12/22/2022, 10:36 AM

## 2022-12-25 DIAGNOSIS — C50911 Malignant neoplasm of unspecified site of right female breast: Secondary | ICD-10-CM | POA: Diagnosis not present

## 2022-12-25 LAB — SURGICAL PATHOLOGY

## 2022-12-28 ENCOUNTER — Encounter: Payer: Self-pay | Admitting: *Deleted

## 2022-12-28 DIAGNOSIS — D0511 Intraductal carcinoma in situ of right breast: Secondary | ICD-10-CM

## 2022-12-29 ENCOUNTER — Encounter: Payer: Self-pay | Admitting: Plastic Surgery

## 2022-12-29 ENCOUNTER — Ambulatory Visit (INDEPENDENT_AMBULATORY_CARE_PROVIDER_SITE_OTHER): Payer: Medicare Other | Admitting: Plastic Surgery

## 2022-12-29 DIAGNOSIS — D0511 Intraductal carcinoma in situ of right breast: Secondary | ICD-10-CM

## 2022-12-29 NOTE — Progress Notes (Signed)
   Subjective:    Patient ID: Janet Fowler, female    DOB: 1950-10-16, 72 y.o.   MRN: [id number]  The patient is a 72 year old female here for follow-up after undergoing breast surgery last week.  She had a right mastectomy with expander placement.  At the time of the surgery she had 250 cc of saline placed.  There does not appear to be any seroma or hematoma.  Her drain output is minimal.  She is doing well overall.      Review of Systems  Constitutional:  Positive for activity change.  Eyes: Negative.   Respiratory: Negative.    Cardiovascular: Negative.   Endocrine: Negative.   Genitourinary: Negative.        Objective:   Physical Exam Constitutional:      Appearance: Normal appearance.  Cardiovascular:     Rate and Rhythm: Normal rate.     Pulses: Normal pulses.  Neurological:     Mental Status: She is alert and oriented to person, place, and time.  Psychiatric:        Mood and Affect: Mood normal.        Behavior: Behavior normal.        Assessment & Plan:     ICD-10-CM   1. Ductal carcinoma in situ (DCIS) of right breast  D05.11       Hopefully we can remove the right drain next week. We placed injectable saline in the Expander using a sterile technique: Right: 50 cc for a total of 300 / 650 cc

## 2023-01-04 ENCOUNTER — Ambulatory Visit: Payer: Medicare Other | Attending: Internal Medicine

## 2023-01-04 ENCOUNTER — Ambulatory Visit (INDEPENDENT_AMBULATORY_CARE_PROVIDER_SITE_OTHER): Payer: Medicare Other | Admitting: Surgical

## 2023-01-04 VITALS — Ht 62.0 in | Wt 204.0 lb

## 2023-01-04 VITALS — BP 127/78 | HR 80 | Resp 16

## 2023-01-04 DIAGNOSIS — Z0000 Encounter for general adult medical examination without abnormal findings: Secondary | ICD-10-CM | POA: Diagnosis not present

## 2023-01-04 DIAGNOSIS — D0511 Intraductal carcinoma in situ of right breast: Secondary | ICD-10-CM

## 2023-01-04 NOTE — Patient Instructions (Signed)
Janet Fowler , Thank you for taking time to come for your Medicare Wellness Visit. I appreciate your ongoing commitment to your health goals. Please review the following plan we discussed and let me know if I can assist you in the future.   These are the goals we discussed:  Goals      Patient Stated     01/04/2023, wants to heal from surgery        This is a list of the screening recommended for you and due dates:  Health Maintenance  Topic Date Due   Hepatitis C Screening: USPSTF Recommendation to screen - Ages 67-79 yo.  Never done   DTaP/Tdap/Td vaccine (1 - Tdap) Never done   Zoster (Shingles) Vaccine (1 of 2) Never done   Colon Cancer Screening  Never done   Pneumonia Vaccine (1 of 1 - PCV) Never done   DEXA scan (bone density measurement)  Never done   COVID-19 Vaccine (3 - Pfizer risk series) 12/30/2019   Flu Shot  05/03/2023   Medicare Annual Wellness Visit  01/04/2024   Mammogram  10/23/2024   HPV Vaccine  Aged Out    Advanced directives: Advance directive discussed with you today.   Conditions/risks identified: none  Next appointment: Follow up in one year for your annual wellness visit    Preventive Care 65 Years and Older, Female Preventive care refers to lifestyle choices and visits with your health care provider that can promote health and wellness. What does preventive care include? A yearly physical exam. This is also called an annual well check. Dental exams once or twice a year. Routine eye exams. Ask your health care provider how often you should have your eyes checked. Personal lifestyle choices, including: Daily care of your teeth and gums. Regular physical activity. Eating a healthy diet. Avoiding tobacco and drug use. Limiting alcohol use. Practicing safe sex. Taking low-dose aspirin every day. Taking vitamin and mineral supplements as recommended by your health care provider. What happens during an annual well check? The services and screenings  done by your health care provider during your annual well check will depend on your age, overall health, lifestyle risk factors, and family history of disease. Counseling  Your health care provider may ask you questions about your: Alcohol use. Tobacco use. Drug use. Emotional well-being. Home and relationship well-being. Sexual activity. Eating habits. History of falls. Memory and ability to understand (cognition). Work and work Statistician. Reproductive health. Screening  You may have the following tests or measurements: Height, weight, and BMI. Blood pressure. Lipid and cholesterol levels. These may be checked every 5 years, or more frequently if you are over 48 years old. Skin check. Lung cancer screening. You may have this screening every year starting at age 38 if you have a 30-pack-year history of smoking and currently smoke or have quit within the past 15 years. Fecal occult blood test (FOBT) of the stool. You may have this test every year starting at age 21. Flexible sigmoidoscopy or colonoscopy. You may have a sigmoidoscopy every 5 years or a colonoscopy every 10 years starting at age 70. Hepatitis C blood test. Hepatitis B blood test. Sexually transmitted disease (STD) testing. Diabetes screening. This is done by checking your blood sugar (glucose) after you have not eaten for a while (fasting). You may have this done every 1-3 years. Bone density scan. This is done to screen for osteoporosis. You may have this done starting at age 76. Mammogram. This may be done  every 1-2 years. Talk to your health care provider about how often you should have regular mammograms. Talk with your health care provider about your test results, treatment options, and if necessary, the need for more tests. Vaccines  Your health care provider may recommend certain vaccines, such as: Influenza vaccine. This is recommended every year. Tetanus, diphtheria, and acellular pertussis (Tdap, Td)  vaccine. You may need a Td booster every 10 years. Zoster vaccine. You may need this after age 64. Pneumococcal 13-valent conjugate (PCV13) vaccine. One dose is recommended after age 28. Pneumococcal polysaccharide (PPSV23) vaccine. One dose is recommended after age 74. Talk to your health care provider about which screenings and vaccines you need and how often you need them. This information is not intended to replace advice given to you by your health care provider. Make sure you discuss any questions you have with your health care provider. Document Released: 10/15/2015 Document Revised: 06/07/2016 Document Reviewed: 07/20/2015 Elsevier Interactive Patient Education  2017 [facility] Prevention in the Home Falls can cause injuries. They can happen to people of all ages. There are many things you can do to make your home safe and to help prevent falls. What can I do on the outside of my home? Regularly fix the edges of walkways and driveways and fix any cracks. Remove anything that might make you trip as you walk through a door, such as a raised step or threshold. Trim any bushes or trees on the path to your home. Use bright outdoor lighting. Clear any walking paths of anything that might make someone trip, such as rocks or tools. Regularly check to see if handrails are loose or broken. Make sure that both sides of any steps have handrails. Any raised decks and porches should have guardrails on the edges. Have any leaves, snow, or ice cleared regularly. Use sand or salt on walking paths during winter. Clean up any spills in your garage right away. This includes oil or grease spills. What can I do in the bathroom? Use night lights. Install grab bars by the toilet and in the tub and shower. Do not use towel bars as grab bars. Use non-skid mats or decals in the tub or shower. If you need to sit down in the shower, use a plastic, non-slip stool. Keep the floor dry. Clean up any  water that spills on the floor as soon as it happens. Remove soap buildup in the tub or shower regularly. Attach bath mats securely with double-sided non-slip rug tape. Do not have throw rugs and other things on the floor that can make you trip. What can I do in the bedroom? Use night lights. Make sure that you have a light by your bed that is easy to reach. Do not use any sheets or blankets that are too big for your bed. They should not hang down onto the floor. Have a firm chair that has side arms. You can use this for support while you get dressed. Do not have throw rugs and other things on the floor that can make you trip. What can I do in the kitchen? Clean up any spills right away. Avoid walking on wet floors. Keep items that you use a lot in easy-to-reach places. If you need to reach something above you, use a strong step stool that has a grab bar. Keep electrical cords out of the way. Do not use floor polish or wax that makes floors slippery. If you must use wax, use  non-skid floor wax. Do not have throw rugs and other things on the floor that can make you trip. What can I do with my stairs? Do not leave any items on the stairs. Make sure that there are handrails on both sides of the stairs and use them. Fix handrails that are broken or loose. Make sure that handrails are as long as the stairways. Check any carpeting to make sure that it is firmly attached to the stairs. Fix any carpet that is loose or worn. Avoid having throw rugs at the top or bottom of the stairs. If you do have throw rugs, attach them to the floor with carpet tape. Make sure that you have a light switch at the top of the stairs and the bottom of the stairs. If you do not have them, ask someone to add them for you. What else can I do to help prevent falls? Wear shoes that: Do not have high heels. Have rubber bottoms. Are comfortable and fit you well. Are closed at the toe. Do not wear sandals. If you use a  stepladder: Make sure that it is fully opened. Do not climb a closed stepladder. Make sure that both sides of the stepladder are locked into place. Ask someone to hold it for you, if possible. Clearly mark and make sure that you can see: Any grab bars or handrails. First and last steps. Where the edge of each step is. Use tools that help you move around (mobility aids) if they are needed. These include: Canes. Walkers. Scooters. Crutches. Turn on the lights when you go into a dark area. Replace any light bulbs as soon as they burn out. Set up your furniture so you have a clear path. Avoid moving your furniture around. If any of your floors are uneven, fix them. If there are any pets around you, be aware of where they are. Review your medicines with your doctor. Some medicines can make you feel dizzy. This can increase your chance of falling. Ask your doctor what other things that you can do to help prevent falls. This information is not intended to replace advice given to you by your health care provider. Make sure you discuss any questions you have with your health care provider. Document Released: 07/15/2009 Document Revised: 02/24/2016 Document Reviewed: 10/23/2014 Elsevier Interactive Patient Education  2017 [employer].

## 2023-01-04 NOTE — Progress Notes (Signed)
Patient is a very pleasant 72 year old female here for follow-up after right mastectomy and sentinel node biopsy with Dr. Marlou Starks and immediate right breast reconstruction and placement tissue expander Flex HD with Dr. Marla Roe on 12/21/2022.  She is 2 weeks postop.  She reports she did well after her last expansion.  She reports she is not having any infectious symptoms, denies fevers or chills.  She does report some mild pain to her right breast.  She describes it as tightness and occasional electric-like shocks.  She reports that JP drain output has been approximately 80 cc per 24 hours.  Chaperone present on exam On exam right mastectomy flaps are viable, incision is CDI.  Right JP drain with serous drainage in bulb, approximately 10 cc.  There is no erythema or cellulitic changes noted.  No subcutaneous fluid collection noted palpation.   A/P:  We discussed continue with compressive garments, avoid intravenous activities or heavy lifting.  We discussed additional fill today.  We discussed inability to remove the right JP drain today due to the high output  We placed injectable saline in the Expander using a sterile technique: Right: 60 cc for a total of 360 / 650 cc  All the patient's questions were answered to her content, we will plan to see her in 1 week for reevaluation and possible drain removal.  She is aware to call with questions or concerns.  I do not see any signs of infection or concern on exam today.

## 2023-01-04 NOTE — Progress Notes (Signed)
I connected with  Janet Fowler on 01/04/23 by a audio enabled telemedicine application and verified that I am speaking with the correct person using two identifiers.  Patient Location: Home  Provider Location: Office/Clinic  I discussed the limitations of evaluation and management by telemedicine. The patient expressed understanding and agreed to proceed.  Subjective:   Janet Fowler is a 72 y.o. female who presents for an Initial Medicare Annual Wellness Visit.  Review of Systems     Cardiac Risk Factors include: advanced age (>39men, >23 women);dyslipidemia;hypertension;obesity (BMI >30kg/m2)     Objective:    Today's Vitals   01/04/23 1526 01/04/23 1527  Weight: 204 lb (92.5 kg)   Height: 5\' 2"  (1.575 m)   PainSc:  3    Body mass index is 37.31 kg/m.     01/04/2023    3:31 PM 12/22/2022    9:32 AM 12/21/2022    3:00 PM 12/18/2022    1:01 PM 11/08/2022    1:55 PM 06/27/2022   11:22 AM 04/19/2022    8:34 PM  Advanced Directives  Does Patient Have a Medical Advance Directive? No No No No No No No  Would patient like information on creating a medical advance directive?  No - Guardian declined  No - Patient declined No - Patient declined No - Patient declined No - Patient declined    Current Medications (verified) Outpatient Encounter Medications as of 01/04/2023  Medication Sig   acetaminophen (TYLENOL) 325 MG tablet Take 325 mg by mouth every 6 (six) hours as needed (pain.).   amLODipine (NORVASC) 10 MG tablet Take 1 tablet (10 mg total) by mouth daily.   diazepam (VALIUM) 2 MG tablet Take 1 tablet (2 mg total) by mouth every 12 (twelve) hours as needed for up to 20 doses for muscle spasms.   ondansetron (ZOFRAN) 4 MG tablet Take 1 tablet (4 mg total) by mouth every 8 (eight) hours as needed for up to 20 doses for nausea or vomiting.   traMADol (ULTRAM) 50 MG tablet Take 1 tablet (50 mg total) by mouth every 8 (eight) hours as needed for up to 20 doses for moderate pain or  severe pain.   No facility-administered encounter medications on file as of 01/04/2023.    Allergies (verified) Patient has no known allergies.   History: Past Medical History:  Diagnosis Date   Breast cancer    Hypertension    Past Surgical History:  Procedure Laterality Date   BIOPSY THYROID Bilateral 2024   BREAST BIOPSY Right 10/30/2022   MM RT BREAST BX W LOC DEV 1ST LESION IMAGE BX SPEC STEREO GUIDE 10/30/2022 [facility]   BREAST BIOPSY Right 10/30/2022   MM RT BREAST BX W LOC DEV EA AD LESION IMG BX SPEC STEREO GUIDE 10/30/2022 [facility]   BREAST RECONSTRUCTION WITH PLACEMENT OF TISSUE EXPANDER AND FLEX HD (ACELLULAR HYDRATED DERMIS) Right 12/21/2022   Procedure: IMMEDIATE RIGHT BREAST RECONSTRUCTION WITH PLACEMENT OF TISSUE EXPANDER AND FLEX HD (ACELLULAR HYDRATED DERMIS);  Surgeon: Wallace Going, DO;  Location: [facility];  Service: Plastics;  Laterality: Right;   COLONOSCOPY  2022   SENTINEL NODE BIOPSY Right 12/21/2022   Procedure: SENTINEL NODE BIOPSY;  Surgeon: Jovita Kussmaul, MD;  Location: [facility];  Service: General;  Laterality: Right;   TONSILLECTOMY     TOTAL MASTECTOMY Right 12/21/2022   Procedure: RIGHT MASTECTOMY;  Surgeon: Jovita Kussmaul, MD;  Location: [facility];  Service: General;  Laterality: Right;  GEN &  PEC BLOCK   Family History  Problem Relation Age of Onset   Hypertension Mother    Arthritis Mother    Stroke Father    Hypertension Father    Colon cancer Brother 73 - 38   Prostate cancer Brother 74 - 5   Prostate cancer Brother 98 - 10   Social History   Socioeconomic History   Marital status: Single    Spouse name: Not on file   Number of children: 0   Years of education: Not on file   Highest education level: Some college, no degree  Occupational History   Occupation: Retired Network engineer  Tobacco Use   Smoking status: Never   Smokeless tobacco: Never  Vaping Use   Vaping Use: Never used  Substance and Sexual Activity    Alcohol use: Never   Drug use: Never   Sexual activity: Not Currently    Birth control/protection: None  Other Topics Concern   Not on file  Social History Narrative   Not on file   Social Determinants of Health   Financial Resource Strain: Low Risk  (12/31/2022)   Overall Financial Resource Strain (CARDIA)    Difficulty of Paying Living Expenses: Not very hard  Food Insecurity: No Food Insecurity (01/04/2023)   Hunger Vital Sign    Worried About Running Out of Food in the Last Year: Never true    Ran Out of Food in the Last Year: Never true  Transportation Needs: No Transportation Needs (01/04/2023)   PRAPARE - Hydrologist (Medical): No    Lack of Transportation (Non-Medical): No  Physical Activity: Inactive (12/31/2022)   Exercise Vital Sign    Days of Exercise per Week: 0 days    Minutes of Exercise per Session: 0 min  Stress: No Stress Concern Present (12/31/2022)   [place]    Feeling of Stress : Not at all  Social Connections: Unknown (12/31/2022)   Social Connection and Isolation Panel [NHANES]    Frequency of Communication with Friends and Family: More than three times a week    Frequency of Social Gatherings with Friends and Family: Twice a week    Attends Religious Services: Not on Advertising copywriter or Organizations: Yes    Attends Music therapist: More than 4 times per year    Marital Status: Never married    Tobacco Counseling Counseling given: Not Answered   Clinical Intake:  Pre-visit preparation completed: Yes  Pain : 0-10 Pain Score: 3  Pain Location: Chest Pain Orientation: Right Pain Descriptors / Indicators: Aching Pain Onset: 1 to 4 weeks ago Pain Frequency: Constant     Nutritional Status: BMI > 30  Obese Nutritional Risks: None  How often do you need to have someone help you when you read instructions, pamphlets, or other  written materials from your doctor or pharmacy?: 1 - Never  Diabetic? no  Interpreter Needed?: No  Information entered by :: NAllen LPN   Activities of Daily Living    12/31/2022   10:43 AM 12/21/2022    3:00 PM  In your present state of health, do you have any difficulty performing the following activities:  Hearing? 0 0  Vision? 0 0  Difficulty concentrating or making decisions? 0 0  Walking or climbing stairs? 0 0  Dressing or bathing? 0 0  Doing errands, shopping? 0 0  Preparing Food and eating ? N  Using the Toilet? N   In the past six months, have you accidently leaked urine? Y   Do you have problems with loss of bowel control? N   Managing your Medications? N   Managing your Finances? N   Housekeeping or managing your Housekeeping? N     Patient Care Team: Ladell Pier, MD as PCP - General (Internal Medicine) Jovita Kussmaul, MD as Consulting Physician (General Surgery) Truitt Merle, MD as Consulting Physician (Hematology) Kyung Rudd, MD as Consulting Physician (Radiation Oncology) Mauro Kaufmann, RN as Oncology Nurse Navigator Rockwell Germany, RN as Oncology Nurse Navigator  Indicate any recent Medical Services you may have received from other than [facility] providers in the past year (date may be approximate).     Assessment:   This is a routine wellness examination for Ne[place].  Hearing/Vision screen Vision Screening - Comments:: Regular eye exams, [facility]  Dietary issues and exercise activities discussed: Current Exercise Habits: The patient does not participate in regular exercise at present   Goals Addressed             This Visit's Progress    Patient Stated       01/04/2023, wants to heal from surgery       Depression Screen    01/04/2023    3:31 PM 10/17/2022   11:27 AM 05/23/2022    9:58 AM  PHQ 2/9 Scores  PHQ - 2 Score 0 0 0  PHQ- 9 Score  0 1    Fall Risk    12/31/2022   10:43 AM 11/09/2022    9:31 AM 10/17/2022   11:24 AM  06/14/2022   11:43 AM 05/23/2022    9:57 AM  Fall Risk   Falls in the past year? 0 0 0 0 0  Number falls in past yr: 0  0 0 0  Injury with Fall? 0  0 0 0  Risk for fall due to : Medication side effect  No Fall Risks No Fall Risks No Fall Risks  Follow up Falls prevention discussed;Education provided;Falls evaluation completed   Falls evaluation completed Falls evaluation completed    FALL RISK PREVENTION PERTAINING TO THE HOME:  Any stairs in or around the home? No  If so, are there any without handrails? N/a Home free of loose throw rugs in walkways, pet beds, electrical cords, etc? Yes  Adequate lighting in your home to reduce risk of falls? Yes   ASSISTIVE DEVICES UTILIZED TO PREVENT FALLS:  Life alert? No  Use of a cane, walker or w/c? No  Grab bars in the bathroom? Yes  Shower chair or bench in shower? Yes  Elevated toilet seat or a handicapped toilet? Yes   TIMED UP AND GO:  Was the test performed? No .      Cognitive Function:        01/04/2023    3:32 PM  6CIT Screen  What Year? 0 points  What month? 0 points  What time? 0 points  Count back from 20 0 points  Months in reverse 0 points  Repeat phrase 2 points  Total Score 2 points    Immunizations Immunization History  Administered Date(s) Administered   Fluad Quad(high Dose 65+) 10/17/2022   PFIZER(Purple Top)SARS-COV-2 Vaccination 11/06/2019, 12/02/2019    TDAP status: Up to date  Flu Vaccine status: Up to date  Pneumococcal vaccine status: Declined,  Education has been provided regarding the importance of this vaccine but patient still declined.  Advised may receive this vaccine at local pharmacy or Health Dept. Aware to provide a copy of the vaccination record if obtained from local pharmacy or Health Dept. Verbalized acceptance and understanding.   Covid-19 vaccine status: Completed vaccines  Qualifies for Shingles Vaccine? Yes   Zostavax completed No   Shingrix Completed?: No.    Education  has been provided regarding the importance of this vaccine. Patient has been advised to call insurance company to determine out of pocket expense if they have not yet received this vaccine. Advised may also receive vaccine at local pharmacy or Health Dept. Verbalized acceptance and understanding.  Screening Tests Health Maintenance  Topic Date Due   Medicare Annual Wellness (AWV)  Never done   Hepatitis C Screening  Never done   DTaP/Tdap/Td (1 - Tdap) Never done   Zoster Vaccines- Shingrix (1 of 2) Never done   COLONOSCOPY (Pts 45-56yrs Insurance coverage will need to be confirmed)  Never done   Pneumonia Vaccine 59+ Years old (1 of 1 - PCV) Never done   DEXA SCAN  Never done   COVID-19 Vaccine (3 - Pfizer risk series) 12/30/2019   INFLUENZA VACCINE  05/03/2023   MAMMOGRAM  10/23/2024   HPV VACCINES  Aged Out    Health Maintenance  Health Maintenance Due  Topic Date Due   Medicare Annual Wellness (AWV)  Never done   Hepatitis C Screening  Never done   DTaP/Tdap/Td (1 - Tdap) Never done   Zoster Vaccines- Shingrix (1 of 2) Never done   COLONOSCOPY (Pts 45-47yrs Insurance coverage will need to be confirmed)  Never done   Pneumonia Vaccine 63+ Years old (1 of 1 - PCV) Never done   DEXA SCAN  Never done   COVID-19 Vaccine (3 - Pfizer risk series) 12/30/2019    Colorectal cancer screening: Type of screening: Colonoscopy. Completed 2021. Repeat every 10 years  Mammogram status: Completed 10/23/2022. Repeat every year  Bone Density status: due  Lung Cancer Screening: (Low Dose CT Chest recommended if Age 103-80 years, 30 pack-year currently smoking OR have quit w/in 15years.) does not qualify.   Lung Cancer Screening Referral: no  Additional Screening:  Hepatitis C Screening: does qualify;   Vision Screening: Recommended annual ophthalmology exams for early detection of glaucoma and other disorders of the eye. Is the patient up to date with their annual eye exam?  Yes  Who is  the provider or what is the name of the office in which the patient attends annual eye exams? [facility] If pt is not established with a provider, would they like to be referred to a provider to establish care? No .   Dental Screening: Recommended annual dental exams for proper oral hygiene  Community Resource Referral / Chronic Care Management: CRR required this visit?  No   CCM required this visit?  No      Plan:     I have personally reviewed and noted the following in the patient's chart:   Medical and social history Use of alcohol, tobacco or illicit drugs  Current medications and supplements including opioid prescriptions. Patient is not currently taking opioid prescriptions. Functional ability and status Nutritional status Physical activity Advanced directives List of other physicians Hospitalizations, surgeries, and ER visits in previous 12 months Vitals Screenings to include cognitive, depression, and falls Referrals and appointments  In addition, I have reviewed and discussed with patient certain preventive protocols, quality metrics, and best practice recommendations. A written personalized care plan for  preventive services as well as general preventive health recommendations were provided to patient.     Kellie Simmering, LPN   [id number]   Nurse Notes: none  Due to this being a virtual visit, the after visit summary with patients personalized plan was offered to patient via mail or my-chart. Patient would like to access on my-chart

## 2023-01-11 ENCOUNTER — Ambulatory Visit (INDEPENDENT_AMBULATORY_CARE_PROVIDER_SITE_OTHER): Payer: Medicare Other | Admitting: Surgical

## 2023-01-11 DIAGNOSIS — D0511 Intraductal carcinoma in situ of right breast: Secondary | ICD-10-CM

## 2023-01-11 NOTE — Progress Notes (Signed)
Patient is a very pleasant 72 year old female here for follow-up on her right breast reconstruction.  She had the tissue expander and Flex HD placed with Dr. Ulice Bold on 12/21/2022.  Patient reports overall she is doing well.  She reports the JP drain output from the right side has been approximately 20 cc per 24 hours over the last few days.  She is not having any issues.  She is hopeful to have the drain removed.  She would like an additional expansion.  Chaperone present on exam On exam right mastectomy flaps are viable.  She does have some blistering noted of the entire horizontal mastectomy incision.  There is no surrounding erythema or cellulitic changes noted.  Right JP drain in place with 3 to 5 cc of serous fluid in the bulb. There is no subcutaneous fluid collection noted palpation.  The blistering does not appear to be full-thickness, it is superficial and the incision is still intact.  A/P:  We removed the right JP drain, patient tolerated this well.  Steri-Strips were removed to reveal blistering over the entire mastectomy incision.  Recommend Vaseline and gauze to JP drain insertion site daily and Vaseline and gauze to breast blistering/wounds daily or twice daily depending drainage.  I do not see any signs of infection on exam.  We placed injectable saline in the Expander using a sterile technique: Right: 50 cc for a total of 410 / 650 cc  We will plan to see the patient back in 2 weeks for reevaluation.  She is aware to call if she notices any concerning symptoms or changes to the right breast blistering.  We discussed specific indications be evaluated sooner than 2 weeks.

## 2023-01-16 NOTE — Progress Notes (Deleted)
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 female with   No problem-specific Assessment & Plan notes found for this encounter.  ***   PLAN:    SUMMARY OF ONCOLOGIC HISTORY: Oncology History Overview Note   Cancer Staging  Ductal carcinoma in situ (DCIS) of right breast Staging form: Breast, AJCC 8th Edition - Clinical stage from 10/30/2022: Stage 0 (cTis (DCIS), cN0, cM0, G3, ER+, PR+, HER2: Not Assessed) - Signed by Malachy Mood, MD on 11/07/2022 Stage prefix: Initial diagnosis Histologic grading system: 3 grade system     Ductal carcinoma in situ (DCIS) of right breast  10/30/2022 Cancer Staging   Staging form: Breast, AJCC 8th Edition - Clinical stage from 10/30/2022: Stage 0 (cTis (DCIS), cN0, cM0, G3, ER+, PR+, HER2: Not Assessed) - Signed by Malachy Mood, MD on 11/07/2022 Stage prefix: Initial diagnosis Histologic grading system: 3 grade system   11/06/2022 Initial Diagnosis   Ductal carcinoma in situ (DCIS) of right breast   12/21/2022 Pathology Results    FINAL MICROSCOPIC DIAGNOSIS:  A. RIGHT BREAST, MASTECTOMY: High-grade ductal carcinoma in situ, solid type with necrosis and calcifications, two foci Negative for invasive carcinoma DCIS involves 5 of 14 submitted blocks (excluding the benign nipple) Margins free (DCIS 0.75 mm from the inferior margin) Changes consistent with prior biopsies (coil  clip and X clip) Prognostic markers (from report [id number]): Estrogen receptor positive, progesterone receptor positive  B. RIGHT AXILLARY SENTINEL LYMPH NODE #1, EXCISION: One benign lymph node, negative for carcinoma (0/1)  C. RIGHT AXILLARY SENTINEL LYMPH NODE, EXCISION: One benign lymph node with lipogranulomata, negative for carcinoma (0/1)  D. RIGHT AXILLARY SENTINEL LYMPH NODE, EXCISION: One benign lymph node, negative for carcinoma (0/1)  E. RIGHT AXILLARY SENTINEL LYMPH NODE, EXCISION: One benign lymph node, negative for carcinoma (0/1)  F. RIGHT AXILLARY SENTINEL LYMPH NODE #2, EXCISION: One benign lymph node with lipogranulomata, negative for carcinoma (0/1)  G. RIGHT AXILLARY SENTINEL LYMPH NODE, EXCISION: One benign lymph node with lipogranulomata, negative for carcinoma (0/1)  H. RIGHT AXILLARY SENTINEL LYMPH NODE, EXCISION: One benign lymph node, negative for carcinoma (0/1)  ONCOLOGY TABLE:  DCIS OF THE BREAST:  Resection Procedure: Lumpectomy Specimen Laterality: Right Histologic Type: Ductal carcinoma in situ Size of DCIS: DCIS involves 5 of 14 submitted blocks (excluding the nipple) Nuclear Grade: High-grade Necrosis: Present Margins: All margins negative for DCIS      Specify Closest Margin (required only if <17mm): DCIS 0.75 mm from the inferior margin Regional Lymph Nodes:      Number of Lymph Nodes Examined: 7      Number of Sentinel Nodes Examined (if applicable): 7      Number of Lymph Nodes with Macrometastases: 0      Number of Lymph Nodes with Micrometastases): 0      Number of Lymph Nodes with Isolated Tumor Cells (=0.2 mm or =200 cells): 0 Breast Biomarker Testing Performed on Previous  Biopsy:      Testing performed on Case Number: [id number]      Estrogen Receptor: Positive (95%, strong staining)      Progesterone Receptor: Positive (30%, strong staining) Pathologic Stage Classification (pTNM, AJCC 8th Edition): pTis,  pN0 Representative Tumor Block: A2, A7 Comment(s): (v4.4.0.0)       INTERVAL HISTORY: *** Janet Fowler is here for a follow up of {diagnosis} She was last seen by {provider} on {date} She presents to the clinic {alone/accompanied by}. {Everything the pt says, basically. How they're feeling, complaints and concerns, etc}   All other systems were reviewed with the patient and are negative.  MEDICAL HISTORY:  Past Medical History:  Diagnosis Date   Breast cancer    Hypertension     SURGICAL HISTORY: Past Surgical History:  Procedure Laterality Date   BIOPSY THYROID Bilateral 2024   BREAST BIOPSY Right 10/30/2022   MM RT BREAST BX W LOC DEV 1ST LESION IMAGE BX SPEC STEREO GUIDE 10/30/2022 [facility]   BREAST BIOPSY Right 10/30/2022   MM RT BREAST BX W LOC DEV EA AD LESION IMG BX SPEC STEREO GUIDE 10/30/2022 [facility]   BREAST RECONSTRUCTION WITH PLACEMENT OF TISSUE EXPANDER AND FLEX HD (ACELLULAR HYDRATED DERMIS) Right 12/21/2022   Procedure: IMMEDIATE RIGHT BREAST RECONSTRUCTION WITH PLACEMENT OF TISSUE EXPANDER AND FLEX HD (ACELLULAR HYDRATED DERMIS);  Surgeon: Peggye Form, DO;  Location: [facility] OR;  Service: Plastics;  Laterality: Right;   COLONOSCOPY  2022   SENTINEL NODE BIOPSY Right 12/21/2022   Procedure: SENTINEL NODE BIOPSY;  Surgeon: Griselda Miner, MD;  Location: [facility] OR;  Service: General;  Laterality: Right;   TONSILLECTOMY     TOTAL MASTECTOMY Right 12/21/2022   Procedure: RIGHT MASTECTOMY;  Surgeon: Griselda Miner, MD;  Location: [facility] OR;  Service: General;  Laterality: Right;  GEN & PEC BLOCK    I have reviewed the social history and family history with the patient and they are unchanged from previous note.  ALLERGIES:  is allergic to steri-strip compound benzoin [benzoin compound].  MEDICATIONS:  Current Outpatient Medications  Medication Sig Dispense Refill   acetaminophen (TYLENOL) 325 MG tablet Take 325 mg by mouth every 6 (six) hours  as needed (pain.).     amLODipine (NORVASC) 10 MG tablet Take 1 tablet (10 mg total) by mouth daily. 90 tablet 1   diazepam (VALIUM) 2 MG tablet Take 1 tablet (2 mg total) by mouth every 12 (twelve) hours as needed for up to 20 doses for muscle spasms. 20 tablet 0   ondansetron (ZOFRAN) 4 MG tablet Take 1 tablet (4 mg total) by mouth every 8 (eight) hours as needed for up to 20 doses for nausea or vomiting. 20 tablet 0   traMADol (ULTRAM) 50 MG tablet Take 1 tablet (50 mg total) by mouth every 8 (eight) hours as needed for up to 20 doses for moderate pain or severe pain. 20 tablet 0   No current facility-administered medications for this visit.    PHYSICAL EXAMINATION: ECOG PERFORMANCE STATUS: {CHL ONC ECOG PS:[id number]}  There were no vitals filed for this visit. Wt Readings from Last 3 Encounters:  01/04/23 204 lb (92.5 kg)  12/21/22 201 lb (91.2 kg)  12/18/22 201 lb 4.8 oz (91.3 kg)    {Only keep what was examined. If exam not performed, can use .CEXAM } GENERAL:alert, no distress and comfortable SKIN: skin color, texture, turgor are normal, no rashes or significant lesions EYES: normal, Conjunctiva are  pink and non-injected, sclera clear {OROPHARYNX:no exudate, no erythema and lips, buccal mucosa, and tongue normal}  NECK: supple, thyroid normal size, non-tender, without nodularity LYMPH:  no palpable lymphadenopathy in the cervical, axillary {or inguinal} LUNGS: clear to auscultation and percussion with normal breathing effort HEART: regular rate & rhythm and no murmurs and no lower extremity edema ABDOMEN:abdomen soft, non-tender and normal bowel sounds Musculoskeletal:no cyanosis of digits and no clubbing  NEURO: alert & oriented x 3 with fluent speech, no focal motor/sensory deficits  LABORATORY DATA:  I have reviewed the data as listed    Latest Ref Rng & Units 12/18/2022    1:20 PM 11/08/2022   11:59 AM 05/23/2022   11:21 AM  CBC  WBC 4.0 - 10.5 K/uL 4.4  4.1  4.5    Hemoglobin 12.0 - 15.0 g/dL 54.0  98.1  19.1   Hematocrit 36.0 - 46.0 % 43.6  40.6  42.7   Platelets 150 - 400 K/uL 306  274  260         Latest Ref Rng & Units 12/18/2022    1:20 PM 11/08/2022   11:59 AM 10/18/2022    4:16 PM  CMP  Glucose 70 - 99 mg/dL 97  84  96   BUN 8 - 23 mg/dL Creatinine 0.44 - 1.00 mg/dL 4.78  2.95  6.21   Sodium 135 - 145 mmol/L 135  139  138   Potassium 3.5 - 5.1 mmol/L 4.0  3.9  4.3   Chloride 98 - 111 mmol/L 104  106  102   CO2 22 - 32 mmol/L Calcium 8.9 - 10.3 mg/dL 9.3  9.7  9.6   Total Protein 6.5 - 8.1 g/dL  7.6    Total Bilirubin 0.3 - 1.2 mg/dL  0.4    Alkaline Phos 38 - 126 U/L  70    AST 15 - 41 U/L  13    ALT 0 - 44 U/L  6        RADIOGRAPHIC STUDIES: I have personally reviewed the radiological images as listed and agreed with the findings in the report. No results found.    No orders of the defined types were placed in this encounter.  All questions were answered. The patient knows to call the clinic with any problems, questions or concerns. No barriers to learning was detected. The total time spent in the appointment was {CHL ONC TIME VISIT - [id number]}.     Salome Holmes, CMA 01/16/2023   I, Monica Martinez, CMA, am acting as scribe for Malachy Mood, MD.   {Add scribe attestation statement}

## 2023-01-17 ENCOUNTER — Telehealth: Payer: Self-pay | Admitting: Hematology

## 2023-01-17 ENCOUNTER — Inpatient Hospital Stay: Payer: Medicare Other | Admitting: Hematology

## 2023-01-17 ENCOUNTER — Encounter: Payer: Self-pay | Admitting: Student

## 2023-01-17 ENCOUNTER — Ambulatory Visit (INDEPENDENT_AMBULATORY_CARE_PROVIDER_SITE_OTHER): Payer: Medicare Other | Admitting: Student

## 2023-01-17 VITALS — BP 148/82 | HR 73 | Temp 98.2°F

## 2023-01-17 DIAGNOSIS — D0511 Intraductal carcinoma in situ of right breast: Secondary | ICD-10-CM

## 2023-01-17 NOTE — Progress Notes (Signed)
Patient is a 72 year old female who underwent right breast reconstruction.  She underwent tissue expander and Flex HD placement with Dr. Ulice Bold on 12/21/2022.  She is almost 4 weeks postop.  She presents to the clinic today with pain underneath her right breast.  Patient was last seen in the clinic on 01/11/2023.  At this visit, patient was overall doing well.  Her drain was putting out approximately 20 cc over 24 hours.  On exam, mastectomy flaps were viable.  There was some blistering noted from the entire horizontal mastectomy incision.  There is no surrounding erythema or cellulitic changes.  The blistering appeared to be superficial and the incision was still intact.  The right JP drain was removed and patient tolerated well.  Steri-Strips were removed to reveal blistering over the entire mastectomy incision.  Vaseline was recommended to the JP drain insertion site daily and Vaseline and gauze to the breast blistering/wounds daily or twice daily.  50 cc of injectable saline was also placed in the expander for a total of 410 cc / 650 cc.  Plan was for patient to return in 2 weeks.  Today, patient reports she is doing okay.  She states that after doing physical therapy stretches on the right side, she started experiencing pain to underneath her breast.  She states that she took tramadol for the pain which she did felt did not help that much.  She states that she was still having the pain this morning.  She states that the pain was sharp.  She denies any fevers or chills.  She denies any redness or drainage in the area.  She denies any other issues or concerns.  Patient reports she has been applying Vaseline to her incision and to the drain site daily.  Vital signs were taken at today's visit.  Vitals are stable.  Patient is afebrile.  Vitals:   01/17/23 0926  BP: (!) 148/82  Pulse: 73  Temp: 98.2 F (36.8 C)  SpO2: 99%    Chaperone present on exam.  On exam, patient is sitting upright in no  acute distress.  Breast is soft.  There is no overlying erythema.  There are no significant fluid collections palpated on exam.  To the inferior aspect of the breast, there is no erythema or changes to the skin noted.  There is some very mild tenderness to palpation.  To the incision, there appears to be a 1.5 x 0.5 cm area of slough to the middle of the incision noted as well as a 0.5 x 0.5 area of slough noted to the lateral aspect of the incision.  Incision is otherwise intact and healing well.  There is no drainage or surrounding erythema the drain site appears to be healing well.  There is some excess skin noted to the right breast.  I discussed with the patient that there does not appear to be any sign of infection on exam.  I discussed with her that her pain may be coming from the stretches that she was doing from physical therapy and correlation with where her deep sutures are placed.  I discussed pain control with the patient.  I discussed with her that she should make sure she is taking Tylenol and ibuprofen, and that she may alternate these throughout the day.  I discussed with her to not exceed the recommended dose on the labels.  I also discussed with the patient that she may take tramadol if she is having sharp pain and she may  take Valium if she is having a muscle spasm.  I discussed with her to not take these 2 medications together.  Patient expressed understanding.  I also discussed with her that she may continue to do her physical therapy exercises, but she should continue with light range of motion.  Patient expressed understanding.  I also discussed with the patient that she will need to keep a close eye on her incision and the area of slough to her incision.  I discussed with her that this may develop into a wound in which we will need to monitor closely.  I discussed with her that if this opens up, begins to drain, becomes painful, if she has any surrounding redness or develops any fevers  or chills or any worsening symptoms she needs to let us know.  Patient expressed understanding.  I discussed with the patient she should continue Vaseline and gauze over the incision and to the drain site daily.  Patient expressed understanding.  I discussed with the patient that if her pain does not in the next few days to give [place] a call.  I discussed with her that I would like to see her back on Monday so we can closely monitor here healing.  Patient expressed understanding.  I instructed the patient to call if she has any questions or concerns in the meantime.  Pictures were obtained of the patient and placed in the chart with the patient's or guardian's permission.

## 2023-01-17 NOTE — Telephone Encounter (Signed)
Patient called to reschedule appointment

## 2023-01-17 NOTE — Assessment & Plan Note (Deleted)
Grade 3, ER+/PR+ -Diagnosed in 10/2022 -She underwent right mastectomy and sentinel lymph node biopsy, which showed high-grade DCIS, no invasive cancer.  Margins were negative, lymph nodes were negative.  I reviewed and discussed with patient. -Her DCIS has been cured by complete surgical resection. Any form of adjuvant therapy is preventive. -she has had right mastectomy, she would not need adjuvant radiation.   -She still has moderate risk for breast cancer in left breast. -Given her strongly positive/negative ER and PR, I do/not recommend antiestrogen therapy with low-dose tamoxifen for 3 years, which decrease her risk of future breast cancer by ~40%.  -We discussed breast cancer surveillance after she completes treatment, Including annual mammogram, breast exam every 6-12 months.  Given her overall moderate risk of left breast cancer, she probably does not need screening breast MRI.

## 2023-01-19 ENCOUNTER — Inpatient Hospital Stay: Payer: Medicare Other | Attending: Hematology | Admitting: Hematology

## 2023-01-19 VITALS — BP 146/74 | HR 67 | Temp 98.3°F | Resp 18 | Ht 62.0 in | Wt 205.3 lb

## 2023-01-19 DIAGNOSIS — Z803 Family history of malignant neoplasm of breast: Secondary | ICD-10-CM | POA: Insufficient documentation

## 2023-01-19 DIAGNOSIS — D0511 Intraductal carcinoma in situ of right breast: Secondary | ICD-10-CM | POA: Insufficient documentation

## 2023-01-19 DIAGNOSIS — Z9011 Acquired absence of right breast and nipple: Secondary | ICD-10-CM | POA: Insufficient documentation

## 2023-01-19 DIAGNOSIS — I10 Essential (primary) hypertension: Secondary | ICD-10-CM | POA: Insufficient documentation

## 2023-01-19 DIAGNOSIS — Z170 Estrogen receptor positive status [ER+]: Secondary | ICD-10-CM | POA: Insufficient documentation

## 2023-01-19 DIAGNOSIS — Z79899 Other long term (current) drug therapy: Secondary | ICD-10-CM | POA: Insufficient documentation

## 2023-01-19 NOTE — Progress Notes (Signed)
[facility]   [phone] [phone]   Clinic Follow up Note   Patient Care Team: Marcine Matar, MD as PCP - General (Internal Medicine) Griselda Miner, MD as Consulting Physician (General Surgery) Malachy Mood, MD as Consulting Physician (Hematology) Dorothy Puffer, MD as Consulting Physician (Radiation Oncology) Pershing Proud, RN as Oncology Nurse Navigator Donnelly Angelica, RN as Oncology Nurse Navigator  Date of Service:  01/19/2023  CHIEF COMPLAINT: f/u of right breast DCIS  CURRENT THERAPY:  Pending   ASSESSMENT:  Janet Fowler is a 72 y.o. female with   Right breast DCIS, grade 2-3, ER positive and PR positive -Diagnosed in January 2024, discovered on screening mammogram. -He underwent a right breast mastectomy on first 2024, which showed high-grade DCIS, solid type with necrosis and calcifications.  Negative for invasive carcinoma.  Margins were negative. -Her DCIS was cured by surgery alone.  Any adjuvant therapy is preventative. -No need for adjuvant radiation due to mastectomy. -I discussed her moderate risk of future breast cancer in left side, and role of antiestrogen therapy to reduce her risk of breast cancer.  I recommend her to consider low-dose tamoxifen 5 mg daily for 3 years. ---The potential side effects, which includes but not limited to, hot flash, skin and vaginal dryness, slightly increased risk of cardiovascular disease and cataract, minimal risk of thrombosis and endometrial cancer due to the low-dose, were discussed with her in great details. She voiced good understanding, and agrees to proceed. Will start after she completes reconstructive surgery in 2 to 3 months -Alternatively, given her active advanced age, cancer surveillance alone is appropriate. -After the above discussion, patient is interested in trying low-dose tamoxifen.  She plans to have her implant surgery in 2 to 3 months, will wait until she recovers well  from her surgery.  PLAN: -She will have implant surgery in 2 to 3 months -Follow-up in 5 months, to start her on low-dose tamoxifen.   SUMMARY OF ONCOLOGIC HISTORY: Oncology History Overview Note   Cancer Staging  Ductal carcinoma in situ (DCIS) of right breast Staging form: Breast, AJCC 8th Edition - Clinical stage from 10/30/2022: Stage 0 (cTis (DCIS), cN0, cM0, G3, ER+, PR+, HER2: Not Assessed) - Signed by Malachy Mood, MD on 11/07/2022 Stage prefix: Initial diagnosis Histologic grading system: 3 grade system     Ductal carcinoma in situ (DCIS) of right breast  10/30/2022 Cancer Staging   Staging form: Breast, AJCC 8th Edition - Clinical stage from 10/30/2022: Stage 0 (cTis (DCIS), cN0, cM0, G3, ER+, PR+, HER2: Not Assessed) - Signed by Malachy Mood, MD on 11/07/2022 Stage prefix: Initial diagnosis Histologic grading system: 3 grade system   11/06/2022 Initial Diagnosis   Ductal carcinoma in situ (DCIS) of right breast   12/21/2022 Pathology Results    FINAL MICROSCOPIC DIAGNOSIS:  A. RIGHT BREAST, MASTECTOMY: High-grade ductal carcinoma in situ, solid type with necrosis and calcifications, two foci Negative for invasive carcinoma DCIS involves 5 of 14 submitted blocks (excluding the benign nipple) Margins free (DCIS 0.75 mm from the inferior margin) Changes consistent with prior biopsies (coil clip and X clip) Prognostic markers (from report [id number]): Estrogen receptor positive, progesterone receptor positive  B. RIGHT AXILLARY SENTINEL LYMPH NODE #1, EXCISION: One benign lymph node, negative for carcinoma (0/1)  C. RIGHT AXILLARY SENTINEL LYMPH NODE, EXCISION: One benign lymph node with lipogranulomata, negative for carcinoma (0/1)  D. RIGHT AXILLARY SENTINEL LYMPH NODE, EXCISION: One benign lymph node, negative  for carcinoma (0/1)  E. RIGHT AXILLARY SENTINEL LYMPH NODE, EXCISION: One benign lymph node, negative for carcinoma (0/1)  F. RIGHT AXILLARY SENTINEL LYMPH NODE  #2, EXCISION: One benign lymph node with lipogranulomata, negative for carcinoma (0/1)  G. RIGHT AXILLARY SENTINEL LYMPH NODE, EXCISION: One benign lymph node with lipogranulomata, negative for carcinoma (0/1)  H. RIGHT AXILLARY SENTINEL LYMPH NODE, EXCISION: One benign lymph node, negative for carcinoma (0/1)  ONCOLOGY TABLE:  DCIS OF THE BREAST:  Resection Procedure: Lumpectomy Specimen Laterality: Right Histologic Type: Ductal carcinoma in situ Size of DCIS: DCIS involves 5 of 14 submitted blocks (excluding the nipple) Nuclear Grade: High-grade Necrosis: Present Margins: All margins negative for DCIS      Specify Closest Margin (required only if <60mm): DCIS 0.75 mm from the inferior margin Regional Lymph Nodes:      Number of Lymph Nodes Examined: 7      Number of Sentinel Nodes Examined (if applicable): 7      Number of Lymph Nodes with Macrometastases: 0      Number of Lymph Nodes with Micrometastases): 0      Number of Lymph Nodes with Isolated Tumor Cells (=0.2 mm or =200 cells): 0 Breast Biomarker Testing Performed on Previous Biopsy:      Testing performed on Case Number: [id number]      Estrogen Receptor: Positive (95%, strong staining)      Progesterone Receptor: Positive (30%, strong staining) Pathologic Stage Classification (pTNM, AJCC 8th Edition): pTis, pN0 Representative Tumor Block: A2, A7 Comment(s): (v4.4.0.0)    12/21/2022 Cancer Staging   Staging form: Breast, AJCC 8th Edition - Pathologic stage from 12/21/2022: Stage 0 (pTis (DCIS), pN0, cM0, G3, ER+, PR+, HER2: Not Assessed) - Signed by Malachy Mood, MD on 01/17/2023 Histologic grading system: 3 grade system Residual tumor (R): R0 - None      INTERVAL HISTORY:  Janet Fowler is here for a follow up of breast cancer. She was last seen by me on 12/21/2022. She presents to the clinic alone. She underwent right mastectomy on December 21, 2022.  She has recovered well. Mild pain, on tylenol PRN  Overall  doing well    All other systems were reviewed with the patient and are negative.  MEDICAL HISTORY:  Past Medical History:  Diagnosis Date   Breast cancer    Hypertension     SURGICAL HISTORY: Past Surgical History:  Procedure Laterality Date   BIOPSY THYROID Bilateral 2024   BREAST BIOPSY Right 10/30/2022   MM RT BREAST BX W LOC DEV 1ST LESION IMAGE BX SPEC STEREO GUIDE 10/30/2022 [facility]   BREAST BIOPSY Right 10/30/2022   MM RT BREAST BX W LOC DEV EA AD LESION IMG BX SPEC STEREO GUIDE 10/30/2022 [facility]   BREAST RECONSTRUCTION WITH PLACEMENT OF TISSUE EXPANDER AND FLEX HD (ACELLULAR HYDRATED DERMIS) Right 12/21/2022   Procedure: IMMEDIATE RIGHT BREAST RECONSTRUCTION WITH PLACEMENT OF TISSUE EXPANDER AND FLEX HD (ACELLULAR HYDRATED DERMIS);  Surgeon: Peggye Form, DO;  Location: [facility] OR;  Service: Plastics;  Laterality: Right;   COLONOSCOPY  2022   SENTINEL NODE BIOPSY Right 12/21/2022   Procedure: SENTINEL NODE BIOPSY;  Surgeon: Griselda Miner, MD;  Location: [place] OR;  Service: General;  Laterality: Right;   TONSILLECTOMY     TOTAL MASTECTOMY Right 12/21/2022   Procedure: RIGHT MASTECTOMY;  Surgeon: Griselda Miner, MD;  Location: [facility] OR;  Service: General;  Laterality: Right;  GEN & PEC BLOCK    I  have reviewed the social history and family history with the patient and they are unchanged from previous note.  ALLERGIES:  is allergic to steri-strip compound benzoin [benzoin compound].  MEDICATIONS:  Current Outpatient Medications  Medication Sig Dispense Refill   acetaminophen (TYLENOL) 325 MG tablet Take 325 mg by mouth every 6 (six) hours as needed (pain.).     amLODipine (NORVASC) 10 MG tablet Take 1 tablet (10 mg total) by mouth daily. 90 tablet 1   diazepam (VALIUM) 2 MG tablet Take 1 tablet (2 mg total) by mouth every 12 (twelve) hours as needed for up to 20 doses for muscle spasms. 20 tablet 0   ondansetron (ZOFRAN) 4 MG tablet Take 1 tablet (4 mg  total) by mouth every 8 (eight) hours as needed for up to 20 doses for nausea or vomiting. 20 tablet 0   traMADol (ULTRAM) 50 MG tablet Take 1 tablet (50 mg total) by mouth every 8 (eight) hours as needed for up to 20 doses for moderate pain or severe pain. 20 tablet 0   No current facility-administered medications for this visit.    PHYSICAL EXAMINATION: ECOG PERFORMANCE STATUS: 0 - Asymptomatic  Vitals:   01/19/23 1459  BP: (!) 146/74  Pulse: 67  Resp: 18  Temp: 98.3 F (36.8 C)  SpO2: 100%   Wt Readings from Last 3 Encounters:  01/19/23 205 lb 4.8 oz (93.1 kg)  01/04/23 204 lb (92.5 kg)  12/21/22 201 lb (91.2 kg)     GENERAL:alert, no distress and comfortable SKIN: skin color, texture, turgor are normal, no rashes or significant lesions EYES: normal, Conjunctiva are pink and non-injected, sclera clear Musculoskeletal:no cyanosis of digits and no clubbing  NEURO: alert & oriented x 3 with fluent speech, no focal motor/sensory deficits  LABORATORY DATA:  I have reviewed the data as listed    Latest Ref Rng & Units 12/18/2022    1:20 PM 11/08/2022   11:59 AM 05/23/2022   11:21 AM  CBC  WBC 4.0 - 10.5 K/uL 4.4  4.1  4.5   Hemoglobin 12.0 - 15.0 g/dL 16.1  09.6  04.5   Hematocrit 36.0 - 46.0 % 43.6  40.6  42.7   Platelets 150 - 400 K/uL 306  274  260         Latest Ref Rng & Units 12/18/2022    1:20 PM 11/08/2022   11:59 AM 10/18/2022    4:16 PM  CMP  Glucose 70 - 99 mg/dL 97  84  96   BUN 8 - 23 mg/dL Creatinine 0.44 - 1.00 mg/dL 4.09  8.11  9.14   Sodium 135 - 145 mmol/L 135  139  138   Potassium 3.5 - 5.1 mmol/L 4.0  3.9  4.3   Chloride 98 - 111 mmol/L 104  106  102   CO2 22 - 32 mmol/L Calcium 8.9 - 10.3 mg/dL 9.3  9.7  9.6   Total Protein 6.5 - 8.1 g/dL  7.6    Total Bilirubin 0.3 - 1.2 mg/dL  0.4    Alkaline Phos 38 - 126 U/L  70    AST 15 - 41 U/L  13    ALT 0 - 44 U/L  6        RADIOGRAPHIC STUDIES: I have personally reviewed  the radiological images as listed and agreed with the findings in the report. No results found.  No orders of the defined types were placed in this encounter.  All questions were answered. The patient knows to call the clinic with any problems, questions or concerns. No barriers to learning was detected. The total time spent in the appointment was 30 minutes.     Malachy Mood, MD 01/19/2023

## 2023-01-22 ENCOUNTER — Ambulatory Visit (INDEPENDENT_AMBULATORY_CARE_PROVIDER_SITE_OTHER): Payer: Medicare Other | Admitting: Student

## 2023-01-22 DIAGNOSIS — D0511 Intraductal carcinoma in situ of right breast: Secondary | ICD-10-CM

## 2023-01-22 DIAGNOSIS — Z9889 Other specified postprocedural states: Secondary | ICD-10-CM

## 2023-01-22 NOTE — Progress Notes (Signed)
Patient is a 72 year old female who underwent right breast reconstruction.  She underwent tissue expander and Flex HD placement with Dr. Ulice Bold on 12/21/2022.  She presents to the clinic today for follow-up.  Patient was last seen in the clinic on 01/17/2023.  At this visit, patient reported she had some pain underneath her right breast.  She denied any fevers, chills or infectious symptoms.  On exam, breast was soft and there is no overlying erythema.  There were no fluid collections on exam.  To the inferior aspect of the breast, there were no changes to the skin or erythema noted.  There is some mild tenderness to palpation.  To the incision, there was a 1.5 x 0.5 cm area of slough to the middle of the incision is well as a 0.5 x 0.5 area of slough noted to the lateral aspect of the incision.  Incision was other wise intact and healing well.  Plan was for patient to continue pain control with Tylenol and ibuprofen.  It was discussed with her she can take a tramadol or Valium if needed.  Plan was for patient to also apply Vaseline to her incision daily and follow-up in a few days.  Today, patient reports she is doing well.  She states that her pain has improved.  She states that she has been taking Tylenol and ibuprofen daily and has felt that this is really helped her pain.  She denies any other issues or concerns.  She denies any drainage from her incision.  She denies any fevers or chills.  Chaperone present on exam.  On exam, patient is sitting upright in no acute distress.  Right breast is soft.  There is no overlying erythema.  There are no fluid collections palpated on exam.  There are no signs of infection on exam.  There are no skin changes to the inferior aspect of the breast.  There is still some very mild tenderness to the inferior aspect of the right breast.  To the middle of the incision, there is an approximately 1.5 cm x 0.5 cm area of exudate with what looks like may be some fibrinous  tissue underneath the exudate as well as to the lateral aspect of the incision there is a 0.5 x 0.5 cm area of exudate.  Both appear to be superficial.   I discussed with the patient that I would like her to slowly decrease the amount of Tylenol and ibuprofen that she is taking throughout the day to see how she can do without medication.  I discussed with her that I would like her to call us if her pain worsens or continues.  Patient expressed understanding.  I discussed with the patient that we will have to wait and see what is underneath the exudate to her incision for any further planning.  I discussed with her that this will eventually slough off and we will be able to have a better plan based off of that.  For now, I would like the patient to apply Medihoney to the areas of exudate on her incision.  I discussed with her she may do this daily or every other day.  I also discussed with the patient I would like her to avoid the use of adhesives.  I discussed with her that she may place gauze and ABD pad over the Medihoney and hold in place with her compressive sports bra.  Patient expressed understanding.  I discussed with the patient that if she notices any worsening  pain, if she notices any drainage, redness, swelling, or any worsening symptoms or has any concerns, she should call us back.  Patient expressed understanding.  Patient to follow back in 1 week or sooner as needed.  I instructed the patient to call in the meantime she has any questions or concerns about anything.

## 2023-01-25 ENCOUNTER — Encounter: Payer: Medicare Other | Admitting: Surgical

## 2023-02-01 NOTE — Progress Notes (Signed)
Patient is a 72 year old female here for follow-up on right breast reconstruction.  She underwent tissue expander Flex HD placement with Dr. Ulice Bold on 12/21/2022.  She is 6 weeks postop.  Patient reports she is overall doing well, she has been applying Medihoney gel to the right breast wound.  She feels that things are improving.  She is not having any infectious symptoms.  She does report some tenderness to her right lateral breast and a "fullness sensation'.  She reports otherwise she feels well.  Chaperone present on exam On exam right breast wound has nearly completely reepithelialized.  There is no surrounding erythema or cellulitic changes noted.  She does have some recently healed areas along the incision that have not developed pigmentation yet.  There is no subcutaneous fluid collection noted with palpation of the right breast.  She does have some fullness of the right lateral breast, potential subcutaneous fluid collection versus residual subcutaneous adipose tissue.  There is no overlying skin changes.  No active drainage from the breast incision.  A/P:  Recommend continuing with Medihoney gel for the next week, discussed with patient she can stop using this once the skin has completely healed and pigmented.  I do not see any signs of infection on exam.  Recommend continuing with compressive garments, avoiding strenuous activities or heavy lifting.  She can overall increase activity as tolerated, but avoid anything too strenuous.  In regards to the fullness of the right lateral breast, potentially a fluid collection versus subcutaneous fat, we discussed attempt at needle aspiration today could be possible due to the sensation of fullness, however patient would like to continue with compression and reevaluate next appointment.  We placed injectable saline in the Expander using a sterile technique: Right: 50 cc for a total of 460 / 650 cc  Recommend following up in 2 weeks for  reevaluation.

## 2023-02-02 ENCOUNTER — Ambulatory Visit (INDEPENDENT_AMBULATORY_CARE_PROVIDER_SITE_OTHER): Payer: Medicare Other | Admitting: Surgical

## 2023-02-02 ENCOUNTER — Encounter: Payer: Self-pay | Admitting: Surgical

## 2023-02-02 VITALS — BP 132/81 | HR 77 | Ht 62.0 in | Wt 207.2 lb

## 2023-02-02 DIAGNOSIS — Z9889 Other specified postprocedural states: Secondary | ICD-10-CM

## 2023-02-02 DIAGNOSIS — D0511 Intraductal carcinoma in situ of right breast: Secondary | ICD-10-CM

## 2023-02-14 ENCOUNTER — Ambulatory Visit (INDEPENDENT_AMBULATORY_CARE_PROVIDER_SITE_OTHER): Payer: Medicare Other | Admitting: Surgical

## 2023-02-14 DIAGNOSIS — D0511 Intraductal carcinoma in situ of right breast: Secondary | ICD-10-CM

## 2023-02-14 DIAGNOSIS — Z9889 Other specified postprocedural states: Secondary | ICD-10-CM

## 2023-02-14 NOTE — Progress Notes (Signed)
Patient is a 72 year old female here for follow-up on her right breast reconstruction.  She underwent basement of tissue expander and Flex HD to right breast with Dr. Ulice Bold on 12/21/2022 after mastectomy.  She is approximately 8 weeks postop.  She reports overall she is doing well, she is not having any infectious symptoms.  She would like to have additional fluid placed in the expander today.  She does have some questions about the medial breast fullness and abnormal shape.  Breast fullness and abnormal shape.  Chaperone present exam On exam right breast incision is completely healed, there is no surrounding erythema or cellulitic changes noted.  No subcutaneous fluid collection noted of the right breast.  I do not appreciate any significant tenderness with palpation.  She does have some excess skin and subcutaneous fat of the medial breast.  A/P  No additional wound care recommended for the right breast wound.  She can use scar cream if she would like.  Referral sent to PT for assistance with improving range of motion and strengthening of the right arm.  No restrictions at this time in regards to lifting.  We placed injectable saline in the Expander using a sterile technique: Right: 50 cc for a total of 510 / 650 cc  At her next appointment we will send her out to surgical scheduling staff to plan for exchange of the right breast tissue expander for an implant and left breast reduction for symmetry in July

## 2023-02-15 ENCOUNTER — Ambulatory Visit: Payer: Self-pay | Admitting: Internal Medicine

## 2023-02-16 ENCOUNTER — Institutional Professional Consult (permissible substitution): Payer: Medicare Other | Admitting: Plastic Surgery

## 2023-02-19 ENCOUNTER — Ambulatory Visit: Payer: Medicare Other | Admitting: Rehabilitation

## 2023-02-19 NOTE — Therapy (Signed)
OUTPATIENT PHYSICAL THERAPY BREAST CANCER POST OP FOLLOW UP   Patient Name: Janet Fowler MRN: [id number] DOB:[birth year], 72 y.o., female Today's Date: 02/20/2023  END OF SESSION:  PT End of Session - 02/20/23 1009     Visit Number 2    Number of Visits 10    Date for PT Re-Evaluation 03/20/23    PT Start Time 1008    PT Stop Time 1044    PT Time Calculation (min) 36 min    Activity Tolerance Patient tolerated treatment well    Behavior During Therapy WFL for tasks assessed/performed             Past Medical History:  Diagnosis Date   Breast cancer (HCC)    Hypertension    Past Surgical History:  Procedure Laterality Date   BIOPSY THYROID Bilateral 2024   BREAST BIOPSY Right 10/30/2022   MM RT BREAST BX W LOC DEV 1ST LESION IMAGE BX SPEC STEREO GUIDE 10/30/2022 [facility]   BREAST BIOPSY Right 10/30/2022   MM RT BREAST BX W LOC DEV EA AD LESION IMG BX SPEC STEREO GUIDE 10/30/2022 [facility]   BREAST RECONSTRUCTION WITH PLACEMENT OF TISSUE EXPANDER AND FLEX HD (ACELLULAR HYDRATED DERMIS) Right 12/21/2022   Procedure: IMMEDIATE RIGHT BREAST RECONSTRUCTION WITH PLACEMENT OF TISSUE EXPANDER AND FLEX HD (ACELLULAR HYDRATED DERMIS);  Surgeon: Peggye Form, DO;  Location: [facility] OR;  Service: Plastics;  Laterality: Right;   COLONOSCOPY  2022   SENTINEL NODE BIOPSY Right 12/21/2022   Procedure: SENTINEL NODE BIOPSY;  Surgeon: Griselda Miner, MD;  Location: [facility] OR;  Service: General;  Laterality: Right;   TONSILLECTOMY     TOTAL MASTECTOMY Right 12/21/2022   Procedure: RIGHT MASTECTOMY;  Surgeon: Griselda Miner, MD;  Location: [facility] OR;  Service: General;  Laterality: Right;  GEN & PEC BLOCK   Patient Active Problem List   Diagnosis Date Noted   Breast cancer (HCC) 12/21/2022   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 Malachy Mood   REFERRING DIAG: Right breast cancer  THERAPY DIAG:  Stiffness of right shoulder, not elsewhere classified  Aftercare following surgery for neoplasm  Cramp and spasm  Abnormal posture  Malignant neoplasm of lower-outer quadrant of right breast of female, estrogen receptor positive (HCC)  Rationale for Evaluation and Treatment: Rehabilitation  ONSET DATE: 10/17/22  SUBJECTIVE:  SUBJECTIVE STATEMENT: I don't think my ROM Is back all the way but it is coming back. I have been trying to do a few exercises. I had to stop them because I was having pain.   PERTINENT HISTORY:  Patient was diagnosed on 10/17/2022 with right grade intermediate DCIS breast cancer. It measures 9.1 cm and is located in the lower outer quadrant. It is ER/PR positive. She has hypertension. 12/21/22- R mastectomy and SLNB 0/7 with immediate expander placement. Will undergo reconstruction this summer  PATIENT GOALS:  Reassess how my recovery is going related to arm function, pain, and swelling.  PAIN:  Are you having pain? No pt reports feeling some pressure in R side of back at lateral aspect (along lats)  PRECAUTIONS: Recent Surgery, right UE Lymphedema risk,   ACTIVITY LEVEL / LEISURE: pt reports she has been doing a little walking but not a lot - been walking to mailbox and back   OBJECTIVE:   PATIENT SURVEYS:  QUICK DASH:  Quick Dash - 02/20/23 0001     Open a tight or new jar Moderate difficulty    Do heavy household chores (wash walls, wash floors) Moderate difficulty    Carry a shopping bag or briefcase Mild difficulty    Wash your back Moderate difficulty    Use a knife to cut food Mild difficulty    Recreational  activities in which you take some force or impact through your arm, shoulder, or hand (golf, hammering, tennis) Moderate difficulty    During the past week, to what extent has your arm, shoulder or hand problem interfered with your normal social activities with family, friends, neighbors, or groups? Slightly    During the past week, to what extent has your arm, shoulder or hand problem limited your work or other regular daily activities Slightly    Arm, shoulder, or hand pain. Moderate    Tingling (pins and needles) in your arm, shoulder, or hand Mild    Difficulty Sleeping Mild difficulty    DASH Score 36.36 %              OBSERVATIONS: Well healed mastectomy scar and expander in place  POSTURE:  Forward head and rounded shoulders posture   UPPER EXTREMITY AROM/PROM:   A/PROM RIGHT   eval   RIGHT 02/20/23  Shoulder extension 52 65  Shoulder flexion 153 140  Shoulder abduction 150 105  Shoulder internal rotation 50 46  Shoulder external rotation 82 88                          (Blank rows = not tested)   A/PROM LEFT   eval  Shoulder extension 52  Shoulder flexion 138  Shoulder abduction 136  Shoulder internal rotation 63  Shoulder external rotation 87                          (Blank rows = not tested)   CERVICAL AROM: All within normal limits   UPPER EXTREMITY STRENGTH: WFL   LYMPHEDEMA ASSESSMENTS:    LANDMARK RIGHT   eval RIGHT 02/20/23  10 cm proximal to olecranon process 33.2 33.5  Olecranon process 25.3 25.8  10 cm proximal to ulnar styloid process 22.6 21.4  Just proximal to ulnar styloid process 15.9 15.8  Across hand at thumb web space 18.7 19  At base of 2nd digit 6.5 6.3  (Blank rows = not tested)   [facility]  LEFT   eval  10 cm proximal to olecranon process 33  Olecranon process 25.2  10 cm proximal to ulnar styloid process 21  Just proximal to ulnar styloid process 15.4  Across hand at thumb web space 18.3  At base of 2nd digit 6.3  (Blank  rows = not tested)  Surgery type/Date: 12/21/22 R mastectomy Number of lymph nodes removed: 0/7 Current/past treatment (chemo, radiation, hormone therapy): pt does not require chemo or radiation, will need hormone therapy Other symptoms:  Heaviness/tightness Yes Pain Yes Pitting edema No Infections No Decreased scar mobility No Stemmer sign No  PATIENT EDUCATION:  Education details: need for post op exercises, proper shoulder mechanics Person educated: Patient Education method: Explanation Education comprehension: verbalized understanding  HOME EXERCISE PROGRAM: Reviewed previously given post op HEP.   ASSESSMENT:  CLINICAL IMPRESSION: Pt returns to PT after undergoing a R mastectomy and SLNB on 12/21/22. She now has decreased R shoulder ROM and discomfort/tightness along R latissimus muscle. She would benefit from skilled PT services to improve R shoulder ROM and decrease tightness across R latissimus and serratus.   Pt will benefit from skilled therapeutic intervention to improve on the following deficits: Decreased knowledge of precautions, impaired UE functional use, pain, decreased ROM, postural dysfunction.   PT treatment/interventions: ADL/Self care home management, Therapeutic exercises, Therapeutic activity, Patient/Family education, Self Care, Joint mobilization, Orthotic/Fit training, Manual lymph drainage, scar mobilization, Manual therapy, and Re-evaluation   GOALS: Goals reviewed with patient? Yes  LONG TERM GOALS:  (STG=LTG)  GOALS Name Target Date  Goal status  1 Pt will demonstrate she has regained full shoulder ROM and function post operatively compared to baselines.  Baseline: 03/20/23 NEW  2 Pt will demonstrate 150 degrees of R shoulder abduction to allow her to return to prior level of function. 03/20/23 NEW  3 Pt will return to 150 degrees of R shoulder flexion to allow her to reach overhead. 03/20/23 NEW  4 Pt will report a 75% decrease in discomfort  along R trunk/latissimus to allow improved comfort.  03/20/23 NEW  5 Pt will be independent in a home exercise program for continued stretching and strengthening. 03/20/23 NEW     PLAN:  PT FREQUENCY/DURATION: 2x/wk for 4 wks  PLAN FOR NEXT SESSION: pulleys, ball, PROM to R shoulder, STM to R lats and serratus   [facility]  [address], [address]  [place] [address]  [phone]  After Breast Cancer Class It is recommended you attend the ABC class to be educated on lymphedema risk reduction. This class is free of charge and lasts for 1 hour. It is a 1-time class. You will need to download the TEAMS app either on your phone or computer. We will send you a link the night before or the morning of the class. You should be able to click on that link to join the class. This is not a confidential class. You don't have to turn your camera on, but other participants may be able to see your email address.  Scar massage You can begin gentle scar massage to you incision sites. Gently place one hand on the incision and move the skin (without sliding on the skin) in various directions. Do this for a few minutes and then you can gently massage either coconut oil or vitamin E cream into the scars.  Compression garment You should continue wearing your compression bra until you feel like you no longer have swelling.  Home exercise Program Continue  doing the exercises you were given until you feel like you can do them without feeling any tightness at the end.   Walking Program Studies show that 30 minutes of walking per day (fast enough to elevate your heart rate) can significantly reduce the risk of a cancer recurrence. If you can't walk due to other medical reasons, we encourage you to find another activity you could do (like a stationary bike or water exercise).  Posture After breast cancer surgery, people frequently sit with rounded shoulders posture because it puts their  incisions on slack and feels better. If you sit like this and scar tissue forms in that position, you can become very tight and have pain sitting or standing with good posture. Try to be aware of your posture and sit and stand up tall to heal properly.  Follow up PT: It is recommended you return every 3 months for the first 3 years following surgery to be assessed on the SOZO machine for an L-Dex score. This helps prevent clinically significant lymphedema in 95% of patients. These follow up screens are 10 minute appointments that you are not billed for.  [facility] [place], PT 02/20/2023, 10:53 AM

## 2023-02-20 ENCOUNTER — Ambulatory Visit: Payer: Medicare Other | Attending: Surgical | Admitting: Physical Therapy

## 2023-02-20 ENCOUNTER — Encounter: Payer: Self-pay | Admitting: Physical Therapy

## 2023-02-20 DIAGNOSIS — Z9889 Other specified postprocedural states: Secondary | ICD-10-CM | POA: Diagnosis not present

## 2023-02-20 DIAGNOSIS — D0511 Intraductal carcinoma in situ of right breast: Secondary | ICD-10-CM | POA: Diagnosis not present

## 2023-02-20 DIAGNOSIS — Z483 Aftercare following surgery for neoplasm: Secondary | ICD-10-CM | POA: Diagnosis not present

## 2023-02-20 DIAGNOSIS — C50511 Malignant neoplasm of lower-outer quadrant of right female breast: Secondary | ICD-10-CM | POA: Diagnosis not present

## 2023-02-20 DIAGNOSIS — Z170 Estrogen receptor positive status [ER+]: Secondary | ICD-10-CM | POA: Insufficient documentation

## 2023-02-20 DIAGNOSIS — R293 Abnormal posture: Secondary | ICD-10-CM | POA: Insufficient documentation

## 2023-02-20 DIAGNOSIS — R252 Cramp and spasm: Secondary | ICD-10-CM | POA: Diagnosis not present

## 2023-02-20 DIAGNOSIS — M25611 Stiffness of right shoulder, not elsewhere classified: Secondary | ICD-10-CM | POA: Insufficient documentation

## 2023-02-23 ENCOUNTER — Ambulatory Visit: Payer: Medicare Other | Admitting: Rehabilitation

## 2023-02-23 ENCOUNTER — Encounter: Payer: Self-pay | Admitting: Rehabilitation

## 2023-02-23 DIAGNOSIS — Z170 Estrogen receptor positive status [ER+]: Secondary | ICD-10-CM | POA: Diagnosis not present

## 2023-02-23 DIAGNOSIS — R293 Abnormal posture: Secondary | ICD-10-CM | POA: Diagnosis not present

## 2023-02-23 DIAGNOSIS — D0511 Intraductal carcinoma in situ of right breast: Secondary | ICD-10-CM | POA: Diagnosis not present

## 2023-02-23 DIAGNOSIS — R252 Cramp and spasm: Secondary | ICD-10-CM | POA: Diagnosis not present

## 2023-02-23 DIAGNOSIS — Z483 Aftercare following surgery for neoplasm: Secondary | ICD-10-CM

## 2023-02-23 DIAGNOSIS — C50511 Malignant neoplasm of lower-outer quadrant of right female breast: Secondary | ICD-10-CM

## 2023-02-23 DIAGNOSIS — M25611 Stiffness of right shoulder, not elsewhere classified: Secondary | ICD-10-CM | POA: Diagnosis not present

## 2023-02-23 DIAGNOSIS — Z9889 Other specified postprocedural states: Secondary | ICD-10-CM | POA: Diagnosis not present

## 2023-02-23 NOTE — Therapy (Signed)
OUTPATIENT PHYSICAL THERAPY BREAST CANCER TREATMENT   Patient Name: Janet Fowler MRN: [id number] DOB:03/18/1951, 72 y.o., female Today's Date: 02/23/2023  END OF SESSION:  PT End of Session - 02/23/23 0946     Visit Number 3    Number of Visits 10    Date for PT Re-Evaluation 03/20/23    PT Start Time 1000    PT Stop Time 1045    PT Time Calculation (min) 45 min    Activity Tolerance Patient tolerated treatment well    Behavior During Therapy [facility] for tasks assessed/performed             Past Medical History:  Diagnosis Date   Breast cancer (HCC)    Hypertension    Past Surgical History:  Procedure Laterality Date   BIOPSY THYROID Bilateral 2024   BREAST BIOPSY Right 10/30/2022   MM RT BREAST BX W LOC DEV 1ST LESION IMAGE BX SPEC STEREO GUIDE 10/30/2022 [facility]   BREAST BIOPSY Right 10/30/2022   MM RT BREAST BX W LOC DEV EA AD LESION IMG BX SPEC STEREO GUIDE 10/30/2022 [facility]   BREAST RECONSTRUCTION WITH PLACEMENT OF TISSUE EXPANDER AND FLEX HD (ACELLULAR HYDRATED DERMIS) Right 12/21/2022   Procedure: IMMEDIATE RIGHT BREAST RECONSTRUCTION WITH PLACEMENT OF TISSUE EXPANDER AND FLEX HD (ACELLULAR HYDRATED DERMIS);  Surgeon: Peggye Form, DO;  Location: [facility] OR;  Service: Plastics;  Laterality: Right;   COLONOSCOPY  2022   SENTINEL NODE BIOPSY Right 12/21/2022   Procedure: SENTINEL NODE BIOPSY;  Surgeon: Griselda Miner, MD;  Location: [facility] OR;  Service: General;  Laterality: Right;   TONSILLECTOMY     TOTAL MASTECTOMY Right 12/21/2022   Procedure: RIGHT MASTECTOMY;  Surgeon: Griselda Miner, MD;  Location: [facility] OR;  Service: General;  Laterality: Right;  GEN & PEC BLOCK   Patient Active Problem List   Diagnosis Date Noted   Breast cancer (HCC) 12/21/2022   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 Malachy Mood   REFERRING DIAG: Right breast cancer  THERAPY DIAG:  Stiffness of right shoulder, not elsewhere classified  Aftercare following surgery for neoplasm  Cramp and spasm  Abnormal posture  Malignant neoplasm of lower-outer quadrant of right breast of female, estrogen receptor positive (HCC)  Rationale for Evaluation and Treatment: Rehabilitation  ONSET DATE: 10/17/22  SUBJECTIVE:  SUBJECTIVE STATEMENT: Nothing new. That side of the arm is kind of sore maybe from sleeping.    EVAL: I don't think my ROM Is back all the way but it is coming back. I have been trying to do a few exercises. I had to stop them because I was having pain.   PERTINENT HISTORY:  Patient was diagnosed on 10/17/2022 with right grade intermediate DCIS breast cancer. It measures 9.1 cm and is located in the lower outer quadrant. It is ER/PR positive. She has hypertension. 12/21/22- R mastectomy and SLNB 0/7 with immediate expander placement. Will undergo reconstruction this summer  PATIENT GOALS:  Reassess how my recovery is going related to arm function, pain, and swelling.  PAIN:  Are you having pain? 4/10 Deltoid region Rt arm   PRECAUTIONS: Recent Surgery, right UE Lymphedema risk,   ACTIVITY LEVEL / LEISURE: pt reports she has been doing a little walking but not a lot - been walking to mailbox and back   OBJECTIVE:   OBSERVATIONS: Well healed mastectomy scar and expander in place  POSTURE:  Forward head and rounded shoulders posture   UPPER EXTREMITY AROM/PROM:   A/PROM RIGHT   eval   RIGHT 02/20/23 02/23/23  Shoulder extension 52 65   Shoulder flexion 153 140 140  Shoulder abduction 150 105 150  Shoulder internal rotation  50 46   Shoulder external rotation 82 88                           (Blank rows = not tested)   A/PROM LEFT   eval  Shoulder extension 52  Shoulder flexion 138  Shoulder abduction 136  Shoulder internal rotation 63  Shoulder external rotation 87                          (Blank rows = not tested)    LYMPHEDEMA ASSESSMENTS:    LANDMARK RIGHT   eval RIGHT 02/20/23  10 cm proximal to olecranon process 33.2 33.5  Olecranon process 25.3 25.8  10 cm proximal to ulnar styloid process 22.6 21.4  Just proximal to ulnar styloid process 15.9 15.8  Across hand at thumb web space 18.7 19  At base of 2nd digit 6.5 6.3  (Blank rows = not tested)   LANDMARK LEFT   eval  10 cm proximal to olecranon process 33  Olecranon process 25.2  10 cm proximal to ulnar styloid process 21  Just proximal to ulnar styloid process 15.4  Across hand at thumb web space 18.3  At base of 2nd digit 6.3  (Blank rows = not tested)  Surgery type/Date: 12/21/22 R mastectomy Number of lymph nodes removed: 0/7 Current/past treatment (chemo, radiation, hormone therapy): pt does not require chemo or radiation, will need hormone therapy Other symptoms:  Heaviness/tightness Yes Pain Yes Pitting edema No Infections No Decreased scar mobility No Stemmer sign No  TODAY"S TREATMENT 02/23/23 Pulleys into flexion and abduction x each with initial cueing - rest between each motion Wall ball flexion x 5  Cane flexion x 8  Supine chest stretch 3" x 6 - reports easy Sidelying ER x 6 Sidelying abd x 6 Seated scapular retractions x 10 Supine PROM into all directions with minimal limitations Sidelying STM (over shirt) to latissimus with 1 sore spot   PATIENT EDUCATION:  Education details: need for post op exercises, proper shoulder mechanics Person educated: Patient Education method: Explanation Education  comprehension: verbalized understanding  HOME EXERCISE PROGRAM: Reviewed previously given post op  HEP.   ASSESSMENT:  CLINICAL IMPRESSION: Pt did very well with her first session and felt minimal stretch with all activities.  Her biggest complaint was +1-2 ttp to the Rt latissimus moving towards insertion over lateral ribs.    Pt will benefit from skilled therapeutic intervention to improve on the following deficits: Decreased knowledge of precautions, impaired UE functional use, pain, decreased ROM, postural dysfunction.   PT treatment/interventions: ADL/Self care home management, Therapeutic exercises, Therapeutic activity, Patient/Family education, Self Care, Joint mobilization, Orthotic/Fit training, Manual lymph drainage, scar mobilization, Manual therapy, and Re-evaluation   GOALS: Goals reviewed with patient? Yes  LONG TERM GOALS:  (STG=LTG)  GOALS Name Target Date  Goal status  1 Pt will demonstrate she has regained full shoulder ROM and function post operatively compared to baselines.  Baseline: 03/20/23 NEW  2 Pt will demonstrate 150 degrees of R shoulder abduction to allow her to return to prior level of function. 03/20/23 NEW  3 Pt will return to 150 degrees of R shoulder flexion to allow her to reach overhead. 03/20/23 NEW  4 Pt will report a 75% decrease in discomfort along R trunk/latissimus to allow improved comfort.  03/20/23 NEW  5 Pt will be independent in a home exercise program for continued stretching and strengthening. 03/20/23 NEW     PLAN:  PT FREQUENCY/DURATION: 2x/wk for 4 wks  PLAN FOR NEXT SESSION: pulleys, ball, PROM to R shoulder, STM to R lats and serratus - Pt needs SOZO around 6/21 on last visit or if Dc'd early schedule it out.     [facility]  [address], [address]  [place] [address]  [phone]  After Breast Cancer Class It is recommended you attend the ABC class to be educated on lymphedema risk reduction. This class is free of charge and lasts for 1 hour. It is a 1-time class. You will need to download the  TEAMS app either on your phone or computer. We will send you a link the night before or the morning of the class. You should be able to click on that link to join the class. This is not a confidential class. You don't have to turn your camera on, but other participants may be able to see your email address.  Scar massage You can begin gentle scar massage to you incision sites. Gently place one hand on the incision and move the skin (without sliding on the skin) in various directions. Do this for a few minutes and then you can gently massage either coconut oil or vitamin E cream into the scars.  Compression garment You should continue wearing your compression bra until you feel like you no longer have swelling.  Home exercise Program Continue doing the exercises you were given until you feel like you can do them without feeling any tightness at the end.   Walking Program Studies show that 30 minutes of walking per day (fast enough to elevate your heart rate) can significantly reduce the risk of a cancer recurrence. If you can't walk due to other medical reasons, we encourage you to find another activity you could do (like a stationary bike or water exercise).  Posture After breast cancer surgery, people frequently sit with rounded shoulders posture because it puts their incisions on slack and feels better. If you sit like this and scar tissue forms in that position, you can become very tight and have  pain sitting or standing with good posture. Try to be aware of your posture and sit and stand up tall to heal properly.  Follow up PT: It is recommended you return every 3 months for the first 3 years following surgery to be assessed on the SOZO machine for an L-Dex score. This helps prevent clinically significant lymphedema in 95% of patients. These follow up screens are 10 minute appointments that you are not billed for.  Idamae Lusher, PT 02/23/2023, 10:46 AM

## 2023-02-28 ENCOUNTER — Ambulatory Visit (INDEPENDENT_AMBULATORY_CARE_PROVIDER_SITE_OTHER): Payer: Medicare Other | Admitting: Surgical

## 2023-02-28 ENCOUNTER — Encounter: Payer: Self-pay | Admitting: Surgical

## 2023-02-28 VITALS — BP 126/84 | HR 77

## 2023-02-28 DIAGNOSIS — Z9889 Other specified postprocedural states: Secondary | ICD-10-CM

## 2023-02-28 DIAGNOSIS — D0511 Intraductal carcinoma in situ of right breast: Secondary | ICD-10-CM

## 2023-02-28 NOTE — Progress Notes (Signed)
Patient is a 72 year old female here for follow-up on her right breast reconstruction.  She underwent placement of right breast tissue expander and Flex HD with Dr. Ulice Bold on 12/21/2022.  She is over 2 months postop.  She is doing well, she is tolerating expansion without any issues.  She reports she is not sure exactly what size she would like to be, about a D cup? She is healing well, she is not having any infectious symptoms.  She reports that physical therapy is going well and it has been helpful with range of motion  Chaperone present on exam On exam right breast incision is healing well, there is no erythema or cellulitic changes noted.  She does have some medial excess skin and fullness noted.  No subcutaneous fluid collection noted palpation.  No tenderness noted palpation.  NAC is surgically absent.  A/P:  We discussed scheduling surgery for around July, surgical route sent today.  Will plan to see patient back in 1 week for additional fill and reevaluation.  We placed injectable saline in the Expander using a sterile technique: Right: 60 cc for a total of 570 / 650 cc  Pictures were obtained of the patient and placed in the chart with the patient's or guardian's permission.

## 2023-03-01 ENCOUNTER — Encounter: Payer: Self-pay | Admitting: Rehabilitation

## 2023-03-01 ENCOUNTER — Ambulatory Visit: Payer: Medicare Other | Admitting: Rehabilitation

## 2023-03-01 DIAGNOSIS — Z483 Aftercare following surgery for neoplasm: Secondary | ICD-10-CM | POA: Diagnosis not present

## 2023-03-01 DIAGNOSIS — Z170 Estrogen receptor positive status [ER+]: Secondary | ICD-10-CM | POA: Diagnosis not present

## 2023-03-01 DIAGNOSIS — M25611 Stiffness of right shoulder, not elsewhere classified: Secondary | ICD-10-CM | POA: Diagnosis not present

## 2023-03-01 DIAGNOSIS — D0511 Intraductal carcinoma in situ of right breast: Secondary | ICD-10-CM | POA: Diagnosis not present

## 2023-03-01 DIAGNOSIS — Z9889 Other specified postprocedural states: Secondary | ICD-10-CM | POA: Diagnosis not present

## 2023-03-01 DIAGNOSIS — R252 Cramp and spasm: Secondary | ICD-10-CM | POA: Diagnosis not present

## 2023-03-01 DIAGNOSIS — R293 Abnormal posture: Secondary | ICD-10-CM

## 2023-03-01 DIAGNOSIS — C50511 Malignant neoplasm of lower-outer quadrant of right female breast: Secondary | ICD-10-CM | POA: Diagnosis not present

## 2023-03-01 NOTE — Therapy (Signed)
OUTPATIENT PHYSICAL THERAPY BREAST CANCER TREATMENT   Patient Name: Janet Fowler MRN: [id number] DOB:[birth year], 72 y.o., female Today's Date: 03/01/2023  END OF SESSION:  PT End of Session - 03/01/23 0856     Visit Number 4    Number of Visits 10    Date for PT Re-Evaluation 03/20/23    PT Start Time 0900    PT Stop Time 0942    PT Time Calculation (min) 42 min    Activity Tolerance Patient tolerated treatment well    Behavior During Therapy [facility] for tasks assessed/performed             Past Medical History:  Diagnosis Date   Breast cancer (HCC)    Hypertension    Past Surgical History:  Procedure Laterality Date   BIOPSY THYROID Bilateral 2024   BREAST BIOPSY Right 10/30/2022   MM RT BREAST BX W LOC DEV 1ST LESION IMAGE BX SPEC STEREO GUIDE 10/30/2022 [facility]   BREAST BIOPSY Right 10/30/2022   MM RT BREAST BX W LOC DEV EA AD LESION IMG BX SPEC STEREO GUIDE 10/30/2022 [facility]   BREAST RECONSTRUCTION WITH PLACEMENT OF TISSUE EXPANDER AND FLEX HD (ACELLULAR HYDRATED DERMIS) Right 12/21/2022   Procedure: IMMEDIATE RIGHT BREAST RECONSTRUCTION WITH PLACEMENT OF TISSUE EXPANDER AND FLEX HD (ACELLULAR HYDRATED DERMIS);  Surgeon: Peggye Form, DO;  Location: [facility] OR;  Service: Plastics;  Laterality: Right;   COLONOSCOPY  2022   SENTINEL NODE BIOPSY Right 12/21/2022   Procedure: SENTINEL NODE BIOPSY;  Surgeon: Griselda Miner, MD;  Location: [facility] OR;  Service: General;  Laterality: Right;   TONSILLECTOMY     TOTAL MASTECTOMY Right 12/21/2022   Procedure: RIGHT MASTECTOMY;  Surgeon: Griselda Miner, MD;  Location: [facility] OR;  Service: General;  Laterality: Right;  GEN & PEC BLOCK   Patient Active Problem List   Diagnosis Date Noted   Breast cancer (HCC) 12/21/2022   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 Malachy Mood   REFERRING DIAG: Right breast cancer  THERAPY DIAG:  Stiffness of right shoulder, not elsewhere classified  Aftercare following surgery for neoplasm  Cramp and spasm  Abnormal posture  Malignant neoplasm of lower-outer quadrant of right breast of female, estrogen receptor positive (HCC)  Rationale for Evaluation and Treatment: Rehabilitation  ONSET DATE: 10/17/22  SUBJECTIVE:  SUBJECTIVE STATEMENT:   I had a fill yesterday and am a little tight.  I may have 2 more.  I am not noting any limitations anymore just the tightness in the back.   EVAL: I don't think my ROM Is back all the way but it is coming back. I have been trying to do a few exercises. I had to stop them because I was having pain.   PERTINENT HISTORY:  Patient was diagnosed on 10/17/2022 with right grade intermediate DCIS breast cancer. It measures 9.1 cm and is located in the lower outer quadrant. It is ER/PR positive. She has hypertension. 12/21/22- R mastectomy and SLNB 0/7 with immediate expander placement. Will undergo reconstruction this summer  PATIENT GOALS:  Reassess how my recovery is going related to arm function, pain, and swelling.  PAIN:  Are you having pain? No  PRECAUTIONS: Recent Surgery, right UE Lymphedema risk,   ACTIVITY LEVEL / LEISURE: pt reports she has been doing a little walking but not a lot - been walking to mailbox and back   OBJECTIVE:   OBSERVATIONS: Well healed mastectomy scar and expander in place  POSTURE:  Forward head and rounded shoulders posture   UPPER EXTREMITY AROM/PROM:   A/PROM RIGHT   eval   RIGHT 02/20/23 02/23/23 03/01/23  Shoulder extension 52 65    Shoulder flexion 153 140 140 140   Shoulder abduction 150 105 150 153  Shoulder internal rotation 50 46    Shoulder external rotation 82 88                            (Blank rows = not tested)   A/PROM LEFT   eval  Shoulder extension 52  Shoulder flexion 138  Shoulder abduction 136  Shoulder internal rotation 63  Shoulder external rotation 87                          (Blank rows = not tested)    LYMPHEDEMA ASSESSMENTS:    LANDMARK RIGHT   eval RIGHT 02/20/23  10 cm proximal to olecranon process 33.2 33.5  Olecranon process 25.3 25.8  10 cm proximal to ulnar styloid process 22.6 21.4  Just proximal to ulnar styloid process 15.9 15.8  Across hand at thumb web space 18.7 19  At base of 2nd digit 6.5 6.3  (Blank rows = not tested)   LANDMARK LEFT   eval  10 cm proximal to olecranon process 33  Olecranon process 25.2  10 cm proximal to ulnar styloid process 21  Just proximal to ulnar styloid process 15.4  Across hand at thumb web space 18.3  At base of 2nd digit 6.3  (Blank rows = not tested)  Surgery type/Date: 12/21/22 R mastectomy Number of lymph nodes removed: 0/7 Current/past treatment (chemo, radiation, hormone therapy): pt does not require chemo or radiation, will need hormone therapy Other symptoms:  Heaviness/tightness Yes Pain Yes Pitting edema No Infections No Decreased scar mobility No Stemmer sign No  TODAY"S TREATMENT 03/01/23 Pulleys into flexion and abduction x each -  reports easy today   Wall ball flexion x 5  Yellow row x 5 with pt reporting a "shock like pain in the Rt lat" similar to pains she gets at home.   Supine Cane flexion x 8  Sidelying ER x 6 Sidelying abd x 6 Supine PROM into all directions with minimal limitations Sidelying STM to latissimus,  UT, rhomboids with 1 main sore sore spot  02/23/23 Pulleys into flexion and abduction x each with initial cueing - rest between each motion Wall ball flexion x 5  Cane flexion x 8  Supine chest stretch 3" x 6 -  reports easy Sidelying ER x 6 Sidelying abd x 6 Seated scapular retractions x 10 Supine PROM into all directions with minimal limitations Sidelying STM (over shirt) to latissimus with 1 sore spot   PATIENT EDUCATION:  Education details: need for post op exercises, proper shoulder mechanics Person educated: Patient Education method: Explanation Education comprehension: verbalized understanding  HOME EXERCISE PROGRAM: Reviewed previously given post op HEP.   ASSESSMENT:  CLINICAL IMPRESSION: Pt is doing very well with no limitations at this point. Her biggest complaint continues to be +1-2 ttp to the Rt latissimus moving towards insertion over lateral ribs with 1 pain reproduced here with row.  Showed pt tennis ball pressure and hand held vibrating massager options.  Told pt she could let us know when she feels ready to be discharged but that we can continue working on latissimus region tenderness.   Pt will benefit from skilled therapeutic intervention to improve on the following deficits: Decreased knowledge of precautions, impaired UE functional use, pain, decreased ROM, postural dysfunction.   PT treatment/interventions: ADL/Self care home management, Therapeutic exercises, Therapeutic activity, Patient/Family education, Self Care, Joint mobilization, Orthotic/Fit training, Manual lymph drainage, scar mobilization, Manual therapy, and Re-evaluation   GOALS: Goals reviewed with patient? Yes  LONG TERM GOALS:  (STG=LTG)  GOALS Name Target Date  Goal status  1 Pt will demonstrate she has regained full shoulder ROM and function post operatively compared to baselines.  Baseline: 03/20/23 MET  2 Pt will demonstrate 150 degrees of R shoulder abduction to allow her to return to prior level of function. 03/20/23 MET  3 Pt will return to 150 degrees of R shoulder flexion to allow her to reach overhead. 03/20/23 NEW  4 Pt will report a 75% decrease in discomfort along R trunk/latissimus to  allow improved comfort.  03/20/23 NEW  5 Pt will be independent in a home exercise program for continued stretching and strengthening. 03/20/23 NEW     PLAN:  PT FREQUENCY/DURATION: 2x/wk for 4 wks  PLAN FOR NEXT SESSION: pulleys, ball, PROM to R shoulder, STM to R lats and serratus - Pt needs SOZO around 6/21 on last visit or if Dc'd early schedule it out.     [facility]  [address], [address]  [place] [address]  [phone]  After Breast Cancer Class It is recommended you attend the ABC class to be educated on lymphedema risk reduction. This class is free of charge and lasts for 1 hour. It is a 1-time class. You will need to download the TEAMS app either on your phone or computer. We will send you a link the night before or the morning of the class. You should be able to click on that link to join the class. This is not a confidential class. You don't have to turn your camera on, but other participants may be able to see your email address.  Scar massage You can begin gentle scar massage to you incision sites. Gently place one hand on the incision and move the skin (without sliding on the skin) in various directions. Do this for a few minutes and then you can gently massage either coconut oil or vitamin E cream into the scars.  Compression garment  You should continue wearing your compression bra until you feel like you no longer have swelling.  Home exercise Program Continue doing the exercises you were given until you feel like you can do them without feeling any tightness at the end.   Walking Program Studies show that 30 minutes of walking per day (fast enough to elevate your heart rate) can significantly reduce the risk of a cancer recurrence. If you can't walk due to other medical reasons, we encourage you to find another activity you could do (like a stationary bike or water exercise).  Posture After breast cancer surgery, people frequently sit with  rounded shoulders posture because it puts their incisions on slack and feels better. If you sit like this and scar tissue forms in that position, you can become very tight and have pain sitting or standing with good posture. Try to be aware of your posture and sit and stand up tall to heal properly.  Follow up PT: It is recommended you return every 3 months for the first 3 years following surgery to be assessed on the SOZO machine for an L-Dex score. This helps prevent clinically significant lymphedema in 95% of patients. These follow up screens are 10 minute appointments that you are not billed for.  Idamae Lusher, PT 03/01/2023, 9:44 AM

## 2023-03-05 ENCOUNTER — Ambulatory Visit: Payer: Medicare Other

## 2023-03-07 ENCOUNTER — Ambulatory Visit: Payer: Medicare Other | Attending: Surgical | Admitting: Physical Therapy

## 2023-03-07 ENCOUNTER — Encounter: Payer: Self-pay | Admitting: Physical Therapy

## 2023-03-07 DIAGNOSIS — R293 Abnormal posture: Secondary | ICD-10-CM | POA: Insufficient documentation

## 2023-03-07 DIAGNOSIS — Z483 Aftercare following surgery for neoplasm: Secondary | ICD-10-CM | POA: Diagnosis not present

## 2023-03-07 DIAGNOSIS — C50511 Malignant neoplasm of lower-outer quadrant of right female breast: Secondary | ICD-10-CM | POA: Insufficient documentation

## 2023-03-07 DIAGNOSIS — M25611 Stiffness of right shoulder, not elsewhere classified: Secondary | ICD-10-CM | POA: Diagnosis not present

## 2023-03-07 DIAGNOSIS — R252 Cramp and spasm: Secondary | ICD-10-CM | POA: Insufficient documentation

## 2023-03-07 DIAGNOSIS — Z170 Estrogen receptor positive status [ER+]: Secondary | ICD-10-CM | POA: Diagnosis not present

## 2023-03-07 NOTE — Patient Instructions (Addendum)
Over Head Pull: Narrow and Wide Grip   Cancer Rehab [phone]   On back, knees bent, feet flat, band across thighs, elbows straight but relaxed. Pull hands apart (start). Keeping elbows straight, bring arms up and over head, hands toward floor. Keep pull steady on band. Hold momentarily. Return slowly, keeping pull steady, back to start. Then do same with a wider grip on the band (past shoulder width) Repeat _10__ times. Band color __yellow____   Side Pull: Double Arm   On back, knees bent, feet flat. Arms perpendicular to body, shoulder level, elbows straight but relaxed. Pull arms out to sides, elbows straight. Resistance band comes across collarbones, hands toward floor. Hold momentarily. Slowly return to starting position. Repeat _10__ times. Band color _yellow____   Sword   On back, knees bent, feet flat, left hand on left hip, right hand above left. Pull right arm DIAGONALLY (hip to shoulder) across chest. Bring right arm along head toward floor. Thumb is pointed down when by opposite hip and rotates to point towards the floor when by head. Hold momentarily. Slowly return to starting position. Repeat _10__ times. Do with left arm. Band color _yellow_____   Shoulder Rotation: Double Arm   On back, knees bent, feet flat, elbows tucked at sides, bent 90, hands palms up. Pull hands apart and down toward floor, keeping elbows near sides. Hold momentarily. Slowly return to starting position. Palms should be facing you. Repeat _5-10__ times. Band color __yellow____

## 2023-03-07 NOTE — Therapy (Signed)
OUTPATIENT PHYSICAL THERAPY BREAST CANCER TREATMENT   Patient Name: Janet Fowler MRN: [id number] DOB:Mar 26, 1951, 72 y.o., female Today's Date: 03/07/2023  END OF SESSION:  PT End of Session - 03/07/23 1404     Visit Number 5    Number of Visits 10    Date for PT Re-Evaluation 03/20/23    PT Start Time 1403    PT Stop Time 1427    PT Time Calculation (min) 24 min    Activity Tolerance Patient tolerated treatment well    Behavior During Therapy [facility] for tasks assessed/performed             Past Medical History:  Diagnosis Date   Breast cancer (HCC)    Hypertension    Past Surgical History:  Procedure Laterality Date   BIOPSY THYROID Bilateral 2024   BREAST BIOPSY Right 10/30/2022   MM RT BREAST BX W LOC DEV 1ST LESION IMAGE BX SPEC STEREO GUIDE 10/30/2022 [facility]   BREAST BIOPSY Right 10/30/2022   MM RT BREAST BX W LOC DEV EA AD LESION IMG BX SPEC STEREO GUIDE 10/30/2022 [facility]   BREAST RECONSTRUCTION WITH PLACEMENT OF TISSUE EXPANDER AND FLEX HD (ACELLULAR HYDRATED DERMIS) Right 12/21/2022   Procedure: IMMEDIATE RIGHT BREAST RECONSTRUCTION WITH PLACEMENT OF TISSUE EXPANDER AND FLEX HD (ACELLULAR HYDRATED DERMIS);  Surgeon: Peggye Form, DO;  Location: [facility] OR;  Service: Plastics;  Laterality: Right;   COLONOSCOPY  2022   SENTINEL NODE BIOPSY Right 12/21/2022   Procedure: SENTINEL NODE BIOPSY;  Surgeon: Griselda Miner, MD;  Location: [facility] OR;  Service: General;  Laterality: Right;   TONSILLECTOMY     TOTAL MASTECTOMY Right 12/21/2022   Procedure: RIGHT MASTECTOMY;  Surgeon: Griselda Miner, MD;  Location: [facility] OR;  Service: General;  Laterality: Right;  GEN & PEC BLOCK   Patient Active Problem List   Diagnosis Date Noted   Breast cancer (HCC) 12/21/2022   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 Malachy Mood   REFERRING DIAG: Right breast cancer  THERAPY DIAG:  Stiffness of right shoulder, not elsewhere classified  Aftercare following surgery for neoplasm  Cramp and spasm  Abnormal posture  Malignant neoplasm of lower-outer quadrant of right breast of female, estrogen receptor positive (HCC)  Rationale for Evaluation and Treatment: Rehabilitation  ONSET DATE: 10/17/22  SUBJECTIVE:  SUBJECTIVE STATEMENT: The sharp pain I was having is better now. The tennis ball has really helped.   EVAL: I don't think my ROM Is back all the way but it is coming back. I have been trying to do a few exercises. I had to stop them because I was having pain.   PERTINENT HISTORY:  Patient was diagnosed on 10/17/2022 with right grade intermediate DCIS breast cancer. It measures 9.1 cm and is located in the lower outer quadrant. It is ER/PR positive. She has hypertension. 12/21/22- R mastectomy and SLNB 0/7 with immediate expander placement. Will undergo reconstruction this summer  PATIENT GOALS:  Reassess how my recovery is going related to arm function, pain, and swelling.  PAIN:  Are you having pain? No  PRECAUTIONS: Recent Surgery, right UE Lymphedema risk,   ACTIVITY LEVEL / LEISURE: pt reports she has been doing a little walking but not a lot - been walking to mailbox and back   OBJECTIVE:   OBSERVATIONS: Well healed mastectomy scar and expander in place  POSTURE:  Forward head and rounded shoulders posture   UPPER EXTREMITY AROM/PROM:   A/PROM RIGHT   eval   RIGHT 02/20/23 02/23/23 03/01/23  Shoulder extension 52 65    Shoulder flexion 153 140 140 140  Shoulder abduction 150 105 150 153  Shoulder internal rotation 50  46    Shoulder external rotation 82 88                            (Blank rows = not tested)   A/PROM LEFT   eval  Shoulder extension 52  Shoulder flexion 138  Shoulder abduction 136  Shoulder internal rotation 63  Shoulder external rotation 87                          (Blank rows = not tested)    LYMPHEDEMA ASSESSMENTS:    LANDMARK RIGHT   eval RIGHT 02/20/23  10 cm proximal to olecranon process 33.2 33.5  Olecranon process 25.3 25.8  10 cm proximal to ulnar styloid process 22.6 21.4  Just proximal to ulnar styloid process 15.9 15.8  Across hand at thumb web space 18.7 19  At base of 2nd digit 6.5 6.3  (Blank rows = not tested)   LANDMARK LEFT   eval  10 cm proximal to olecranon process 33  Olecranon process 25.2  10 cm proximal to ulnar styloid process 21  Just proximal to ulnar styloid process 15.4  Across hand at thumb web space 18.3  At base of 2nd digit 6.3  (Blank rows = not tested)  Surgery type/Date: 12/21/22 R mastectomy Number of lymph nodes removed: 0/7 Current/past treatment (chemo, radiation, hormone therapy): pt does not require chemo or radiation, will need hormone therapy Other symptoms:  Heaviness/tightness Yes Pain Yes Pitting edema No Infections No Decreased scar mobility No Stemmer sign No  TODAY"S TREATMENT 03/07/23  L-DEX FLOWSHEETS - 03/07/23 1400       L-DEX LYMPHEDEMA SCREENING   Measurement Type Unilateral    L-DEX MEASUREMENT EXTREMITY Upper Extremity    POSITION  Standing    DOMINANT SIDE Right    At Risk Side Right    BASELINE SCORE (UNILATERAL) -2.7    L-DEX SCORE (UNILATERAL) -4.4    VALUE CHANGE (UNILAT) -1.7            The patient was assessed using the L-Dex machine today  to produce a lymphedema index baseline score. The patient will be reassessed on a regular basis (typically every 3 months) to obtain new L-Dex scores. If the score is > 6.5 points away from his/her baseline score indicating onset of subclinical  lymphedema, it will be recommended to wear a compression garment for 4 weeks, 12 hours per day and then be reassessed. If the score continues to be > 6.5 points from baseline at reassessment, we will initiate lymphedema treatment. Assessing in this manner has a 95% rate of preventing clinically significant lymphedema.  Instructed pt in supine scapular strengthening series and issued as part of HEP as follows: narrow and wide overhead flexion x 10 with yellow band, horizontal abduction with yellow band x 10 with v/c to keep elbows straight, ER with v/c for correct form, D2 with v/c for rotation or arm throughout x 10   03/01/23 Pulleys into flexion and abduction x each -  reports easy today   Wall ball flexion x 5  Yellow row x 5 with pt reporting a "shock like pain in the Rt lat" similar to pains she gets at home.   Supine Cane flexion x 8  Sidelying ER x 6 Sidelying abd x 6 Supine PROM into all directions with minimal limitations Sidelying STM to latissimus, UT, rhomboids with 1 main sore sore spot  02/23/23 Pulleys into flexion and abduction x each with initial cueing - rest between each motion Wall ball flexion x 5  Cane flexion x 8  Supine chest stretch 3" x 6 - reports easy Sidelying ER x 6 Sidelying abd x 6 Seated scapular retractions x 10 Supine PROM into all directions with minimal limitations Sidelying STM (over shirt) to latissimus with 1 sore spot   PATIENT EDUCATION:  Education details: need for post op exercises, proper shoulder mechanics Person educated: Patient Education method: Explanation Education comprehension: verbalized understanding  HOME EXERCISE PROGRAM: Reviewed previously given post op HEP. Scapular strengthening series   ASSESSMENT:  CLINICAL IMPRESSION: Pt is no longer having the pain in her latissimus area. She has met all goals for therapy. Instructed pt in supine scapular strengthening exercises today and issued these as part of her HEP.  She will be discharged from skilled PT services at this time.   Pt will benefit from skilled therapeutic intervention to improve on the following deficits: Decreased knowledge of precautions, impaired UE functional use, pain, decreased ROM, postural dysfunction.   PT treatment/interventions: ADL/Self care home management, Therapeutic exercises, Therapeutic activity, Patient/Family education, Self Care, Joint mobilization, Orthotic/Fit training, Manual lymph drainage, scar mobilization, Manual therapy, and Re-evaluation   GOALS: Goals reviewed with patient? Yes  LONG TERM GOALS:  (STG=LTG)  GOALS Name Target Date  Goal status  1 Pt will demonstrate she has regained full shoulder ROM and function post operatively compared to baselines.  Baseline: 03/20/23 MET  2 Pt will demonstrate 150 degrees of R shoulder abduction to allow her to return to prior level of function. 03/20/23 MET  3 Pt will return to 150 degrees of R shoulder flexion to allow her to reach overhead. 03/20/23 03/07/23-  MET 150 degrees  4 Pt will report a 75% decrease in discomfort along R trunk/latissimus to allow improved comfort.  03/20/23 03/07/23- MET 75% decrease  5 Pt will be independent in a home exercise program for continued stretching and strengthening. 03/20/23 03/07/23- MET     PLAN:  PT FREQUENCY/DURATION: 2x/wk for 4 wks  PLAN FOR NEXT SESSION: d/c this visit,  continue ldex screenings every 3 months   PHYSICAL THERAPY DISCHARGE SUMMARY  Visits from Start of Care: 5  Current functional level related to goals / functional outcomes: All goals met   Remaining deficits: None   Education / Equipment: HEP, lymphedema education   Patient agrees to discharge. Patient goals were met. Patient is being discharged due to meeting the stated rehab goals.  [facility], PT 03/07/2023, 2:35 PM

## 2023-03-08 ENCOUNTER — Ambulatory Visit (INDEPENDENT_AMBULATORY_CARE_PROVIDER_SITE_OTHER): Payer: Medicare Other | Admitting: Surgical

## 2023-03-08 VITALS — BP 125/67 | HR 68

## 2023-03-08 DIAGNOSIS — Z9889 Other specified postprocedural states: Secondary | ICD-10-CM

## 2023-03-08 DIAGNOSIS — D0511 Intraductal carcinoma in situ of right breast: Secondary | ICD-10-CM

## 2023-03-08 NOTE — Progress Notes (Signed)
Patient is a very pleasant 72 year old female here for follow-up on her right breast reconstruction.  She has been tolerating expander fills well.  She has completed physical therapy and was discharged due to meeting therapeutic goals.  She reports she feels well.  She is looking forward to having the expander exchanged for an implant.  She is traveling in June for a week and would like to have the exchange in July if possible.  She is not having any infectious symptoms.  On exam right breast incision is intact and healing well.  No erythema or cellulitic changes noted.  She does have medial excess skin and fullness noted on exam.  There is no subcutaneous fluid collection noted with palpation.  There is no tenderness noted with palpation.  A/P:  Surgical route previously sent on 02/28/2023.  Hopefully able to schedule surgery for July for exchange of right breast tissue expander for implant, left breast reduction for symmetry.  We placed injectable saline in the Expander using a sterile technique: Right: 50 cc for a total of 620 / 650 cc  Recommend calling with questions or concerns.  Recommend following up in 2 weeks for additional possible expansion.  Patient was provided with [employer] implant consent form, general surgical consent form and implant tracking forms today to complete prior to her preop.

## 2023-03-12 ENCOUNTER — Encounter: Payer: Self-pay | Admitting: Internal Medicine

## 2023-03-12 ENCOUNTER — Ambulatory Visit: Payer: Medicare Other | Attending: Family Medicine | Admitting: Internal Medicine

## 2023-03-12 VITALS — BP 137/84 | HR 81 | Temp 98.1°F | Ht 62.0 in | Wt 206.0 lb

## 2023-03-12 DIAGNOSIS — E041 Nontoxic single thyroid nodule: Secondary | ICD-10-CM

## 2023-03-12 DIAGNOSIS — D0511 Intraductal carcinoma in situ of right breast: Secondary | ICD-10-CM

## 2023-03-12 DIAGNOSIS — I10 Essential (primary) hypertension: Secondary | ICD-10-CM

## 2023-03-12 DIAGNOSIS — M899 Disorder of bone, unspecified: Secondary | ICD-10-CM

## 2023-03-12 MED ORDER — AMLODIPINE BESYLATE 10 MG PO TABS: 10.0000 mg | ORAL_TABLET | Freq: Every day | ORAL | 1 refills | Status: DC

## 2023-03-12 NOTE — Progress Notes (Signed)
Patient ID: Janet Fowler, female    DOB: Mar 19, 1951  MRN: [id number]  CC: Hypertension (HTN f/u. Franchot Erichsen Tdap, shingles, & pneumonia vax. /)   Subjective: Janet Fowler is a 72 y.o. 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  She had clean margins.  Did not require chemo or XRT.  Planning for reconstructive surgery hopefully in July.  Patient states that Dr. Mosetta Putt plans to start her on antiestrogen therapy thereafter.  HTN: Reports compliance with Norvasc 10 mg daily.  Checks blood pressure every other day and reports systolic blood pressure has been in the 120s.  I also see that her last 3 blood pressure readings recorded in the system from visits with other specialists have been good.  She limits salt in the foods.  Thyroid nodule: Biopsy on the right came back okay.  Biopsy of left thyroid nodule revealed insufficient cells after 2 biopsy attempts.  I had referred her to Dr. Gerrit Friends for him to take a look at the biopsy results and ultrasound and help [place] determine whether we need to pursue the nodule on the left any further.  On last visit with me, she indicated that she was called by their office but had not called them back as yet.  She had plan to do so but then diagnosis of breast cancer came along and this was placed on the back burner.  We had repeat CT of the head in February as a follow-up from CTA done 04/2022 with question of AVM. The CTA showed no significant vascular abnormalities. However there is mention of a 17 x 6 mm lesion seen on the calvarium/scalp. 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 However he recommends repeat scan in 6 months to make sure there is no change.  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     diazepam (VALIUM) 2 MG tablet Take 1 tablet (2 mg total) by mouth every 12 (twelve) hours as needed for up to 20 doses for muscle spasms. (Patient not taking: Reported on 03/12/2023) 20 tablet 0   ondansetron (ZOFRAN) 4 MG tablet Take 1 tablet (4 mg total) by mouth every 8 (eight) hours as needed for up to 20 doses for nausea or vomiting. (Patient not taking: Reported on 03/12/2023) 20 tablet 0   traMADol (ULTRAM) 50 MG tablet Take 1 tablet (50 mg total) by mouth every 8 (eight) hours as needed for up to 20 doses for moderate pain or severe pain. (Patient not taking: Reported on 03/12/2023) 20 tablet 0   No current facility-administered medications on file prior to visit.    Allergies  Allergen Reactions   Steri-Strip Compound Benzoin [Benzoin Compound] Other (See Comments)    Steri-Strips cause blisters on the skin    Social History  Socioeconomic History   Marital status: Single    Spouse name: Not on file   Number of children: 0   Years of education: Not on file   Highest education level: 12th grade  Occupational History   Occupation: Retired [employer] Services operational officer  Tobacco Use   Smoking status: Never   Smokeless tobacco: Never  Vaping Use   Vaping Use: Never used  Substance and Sexual Activity   Alcohol use: Never   Drug use: Never   Sexual activity: Not Currently    Birth control/protection: None  Other Topics Concern   Not on file  Social History Narrative   Not on file   Social Determinants of Health   Financial Resource Strain: Low  Risk  (03/08/2023)   Overall Financial Resource Strain (CARDIA)    Difficulty of Paying Living Expenses: Not hard at all  Food Insecurity: No Food Insecurity (03/08/2023)   Hunger Vital Sign    Worried About Running Out of Food in the Last Year: Never true    Ran Out of Food in the Last Year: Never true  Transportation Needs: No Transportation Needs (03/08/2023)   PRAPARE - Administrator, Civil Service (Medical): No    Lack of Transportation (Non-Medical): No  Physical Activity: Insufficiently Active (03/08/2023)   Exercise Vital Sign    Days of Exercise per Week: 3 days    Minutes of Exercise per Session: 20 min  Stress: No Stress Concern Present (03/08/2023)   [employer] of Occupational Health - Occupational Stress Questionnaire    Feeling of Stress : Only a little  Social Connections: Moderately Integrated (03/08/2023)   Social Connection and Isolation Panel [NHANES]    Frequency of Communication with Friends and Family: More than three times a week    Frequency of Social Gatherings with Friends and Family: Twice a week    Attends Religious Services: More than 4 times per year    Active Member of Clubs or Organizations: Yes    Attends Banker Meetings: More than 4 times per year    Marital Status: Never married  Intimate Partner Violence: Not At Risk (12/21/2022)   Humiliation, Afraid, Rape, and Kick questionnaire    Fear of Current or Ex-Partner: No    Emotionally Abused: No    Physically Abused: No    Sexually Abused: No    Family History  Problem Relation Age of Onset   Hypertension Mother    Arthritis Mother    Stroke Father    Hypertension Father    Colon cancer Brother 33 - 105   Prostate cancer Brother 61 - 17   Prostate cancer Brother 29 - 69    Past Surgical History:  Procedure Laterality Date   BIOPSY THYROID Bilateral 2024   BREAST BIOPSY Right 10/30/2022   MM RT BREAST BX W LOC DEV 1ST LESION IMAGE BX SPEC STEREO GUIDE 10/30/2022 [facility]  MAMMOGRAPHY   BREAST BIOPSY Right 10/30/2022   MM RT BREAST BX W LOC DEV EA AD LESION IMG BX SPEC STEREO GUIDE 10/30/2022 [facility] MAMMOGRAPHY   BREAST RECONSTRUCTION WITH PLACEMENT OF TISSUE EXPANDER AND FLEX HD (ACELLULAR HYDRATED DERMIS) Right 12/21/2022   Procedure: IMMEDIATE RIGHT BREAST RECONSTRUCTION WITH PLACEMENT OF TISSUE EXPANDER AND FLEX HD (ACELLULAR HYDRATED DERMIS);  Surgeon: Peggye Form, DO;  Location: [facility] OR;  Service: Plastics;  Laterality: Right;   COLONOSCOPY  2022   SENTINEL NODE BIOPSY Right 12/21/2022   Procedure: SENTINEL NODE BIOPSY;  Surgeon: Chevis Pretty III,  MD;  Location: [facility] OR;  Service: General;  Laterality: Right;   TONSILLECTOMY     TOTAL MASTECTOMY Right 12/21/2022   Procedure: RIGHT MASTECTOMY;  Surgeon: Griselda Miner, MD;  Location: [facility] OR;  Service: General;  Laterality: Right;  GEN & PEC BLOCK    ROS: Review of Systems Negative except as stated above  PHYSICAL EXAM: BP 137/84 (BP Location: Left Arm, Patient Position: Sitting, Cuff Size: Large)   Pulse 81   Temp 98.1 F (36.7 C) (Oral)   Ht 5\' 2"  (1.575 m)   Wt 206 lb (93.4 kg)   SpO2 100%   BMI 37.68 kg/m   Physical Exam Repeat BP 150/84 General appearance - alert, well appearing, and in no distress Mental status - normal mood, behavior, speech, dress, motor activity, and thought processes Chest - clear to auscultation, no wheezes, rales or rhonchi, symmetric air entry Heart - normal rate, regular rhythm, normal S1, S2, no murmurs, rubs, clicks or gallops Extremities - peripheral pulses normal, no pedal edema, no clubbing or cyanosis      Latest Ref Rng & Units 12/18/2022    1:20 PM 11/08/2022   11:59 AM 10/18/2022    4:16 PM  CMP  Glucose 70 - 99 mg/dL 97  84  96   BUN 8 - 23 mg/dL 9  9  12    Creatinine 0.44 - 1.00 mg/dL 4.09  8.11  9.14   Sodium 135 - 145 mmol/L 135  139  138   Potassium 3.5 - 5.1 mmol/L 4.0  3.9  4.3   Chloride 98 - 111 mmol/L 104  106  102   CO2 22 - 32 mmol/L  24  27  23    Calcium 8.9 - 10.3 mg/dL 9.3  9.7  9.6   Total Protein 6.5 - 8.1 g/dL  7.6    Total Bilirubin 0.3 - 1.2 mg/dL  0.4    Alkaline Phos 38 - 126 U/L  70    AST 15 - 41 U/L  13    ALT 0 - 44 U/L  6     Lipid Panel     Component Value Date/Time   CHOL 203 (H) 10/18/2022 1616   TRIG 84 10/18/2022 1616   HDL 84 10/18/2022 1616   CHOLHDL 2.4 10/18/2022 1616   LDLCALC 104 (H) 10/18/2022 1616    CBC    Component Value Date/Time   WBC 4.4 12/18/2022 1320   RBC 4.93 12/18/2022 1320   HGB 14.4 12/18/2022 1320   HGB 13.7 11/08/2022 1159   HGB 14.3 05/23/2022 1121   HCT 43.6 12/18/2022 1320   HCT 42.7 05/23/2022 1121   PLT 306 12/18/2022 1320   PLT 274 11/08/2022 1159   PLT 260 05/23/2022 1121   MCV 88.4 12/18/2022 1320   MCV 88 05/23/2022 1121   MCH 29.2 12/18/2022 1320   MCHC 33.0 12/18/2022 1320   RDW 12.3 12/18/2022 1320   RDW 12.6 05/23/2022 1121   LYMPHSABS 1.9 11/08/2022 1159   MONOABS 0.4 11/08/2022 1159   EOSABS 0.1 11/08/2022 1159   BASOSABS 0.0 11/08/2022 1159    ASSESSMENT AND PLAN: 1. Essential hypertension Not at goal.  However home blood pressure readings have been good and recent blood pressure readings on visits with specialist have been good.  Continue Norvasc 10 mg daily - amLODipine (NORVASC) 10 MG tablet; Take 1 tablet (10 mg total) by mouth daily.  Dispense: 90 tablet; Refill: 1  2. Thyroid nodule Patient plans to pursue  this after she gets through her reconstructive breast surgery.  3. Ductal carcinoma in situ (DCIS) of right breast Status postmastectomy.  Plan for reconstructive surgery with plastic surgeon  4. Skull lesion We will repeat CAT scan of the head the end of August. - CT HEAD WO CONTRAST ( ); Future    Patient was given the opportunity to ask questions.  Patient verbalized understanding of the plan and was able to repeat key elements of the plan.   This documentation was completed using Public librarian.  Any transcriptional errors are unintentional.  Orders Placed This Encounter  Procedures   CT HEAD WO CONTRAST ( )     Requested Prescriptions   Signed Prescriptions Disp Refills   amLODipine (NORVASC) 10 MG tablet 90 tablet 1    Sig: Take 1 tablet (10 mg total) by mouth daily.    Return in about 4 months (around 07/12/2023) for Sign release to get colonoscopy report from [facility].  Jonah Blue, MD, FACP

## 2023-03-13 ENCOUNTER — Encounter: Payer: Medicare Other | Admitting: Physical Therapy

## 2023-03-15 ENCOUNTER — Encounter: Payer: Medicare Other | Admitting: Physical Therapy

## 2023-03-22 ENCOUNTER — Ambulatory Visit (INDEPENDENT_AMBULATORY_CARE_PROVIDER_SITE_OTHER): Payer: Medicare Other | Admitting: Surgical

## 2023-03-22 ENCOUNTER — Telehealth: Payer: Self-pay | Admitting: Surgical

## 2023-03-22 DIAGNOSIS — D0511 Intraductal carcinoma in situ of right breast: Secondary | ICD-10-CM | POA: Diagnosis not present

## 2023-03-22 DIAGNOSIS — Z9889 Other specified postprocedural states: Secondary | ICD-10-CM

## 2023-03-22 MED ORDER — DIAZEPAM 2 MG PO TABS: 2.0000 mg | ORAL_TABLET | Freq: Two times a day (BID) | ORAL | 0 refills | Status: DC | PRN

## 2023-03-22 NOTE — Telephone Encounter (Signed)
Patient called back and said that [facility] wanted her to take diazepam (VALIUM) 2 MG tablet again but she said she needs a refill sent to [id number] - [place],  - [address] AT [facility] [address]

## 2023-03-22 NOTE — Progress Notes (Signed)
Patient is a very pleasant 72 year old female here for follow-up on her right breast reconstruction.  She was last seen in the office on 03/08/2023, she reports she is doing well overall since her last expansion at that time.  She does report that she has noticed some tightness of the right breast.  She is not having any infectious symptoms.  She is ready to get the expander exchanged for an implant.  She was previously provided with the implant consent forms.  She reports she has an appointment with Dr. Ulice Bold early July for reevaluation prior to exchange.    Chaperone present on exam On exam right breast incision is intact and healing well.  There is no erythema or cellulitic changes noted.  There is some excess skin noted of the right mastectomy skin flaps, particularly medially.  There is no subcutaneous fluid collection noted palpation.  There is mild tenderness noted with palpation of the expander.   A/P:  She is scheduled to follow-up with Dr. Ulice Bold in a few weeks to reevaluate the right breast and discuss surgical planning for right breast expander to implant exchange and left breast reduction for symmetry.  Discussed working on scheduling patient for surgery early to mid July with Dr. Ulice Bold with surgical scheduling staff today.  Patient was previously provided with consent forms and is aware to review these and we will sign these day of preop.  There is no signs of infection or concern on exam.  All of her questions were answered to her content.  We do not plan to do any more fills at this time.  She has a total of 620 out of 650 cc in the right breast tissue expander.

## 2023-03-28 ENCOUNTER — Encounter: Payer: Self-pay | Admitting: Internal Medicine

## 2023-03-28 NOTE — Progress Notes (Signed)
I received colonoscopy report from [facility] Dr. Beaulah Corin.  Colonoscopy was performed 10/04/2020.  Findings were mild external and internal hemorrhoids, 2 cm lipomas in the proximal ascending biopsies were taken.  Pathology revealed colonic mucosa with no significant pathologic changes.

## 2023-04-03 ENCOUNTER — Encounter: Payer: Self-pay | Admitting: Plastic Surgery

## 2023-04-03 ENCOUNTER — Ambulatory Visit (INDEPENDENT_AMBULATORY_CARE_PROVIDER_SITE_OTHER): Payer: Medicare Other | Admitting: Plastic Surgery

## 2023-04-03 DIAGNOSIS — D0511 Intraductal carcinoma in situ of right breast: Secondary | ICD-10-CM

## 2023-04-03 NOTE — Progress Notes (Signed)
   Subjective:    Patient ID: Janet Fowler, female    DOB: January 16, 1951, 72 y.o.   MRN: [id number]  The patient is a 72 year old female here for follow-up after undergoing a right breast mastectomy with expander placement.  She was diagnosed with right breast ductal carcinoma in situ grade 2-3.  It was estrogen and progesterone positive.  She is not a smoker.  She does not have diabetes.  She is 5 feet 2 inches tall weighs around 200 pounds.  Preop bra size is a D/DD.  She currently has 620 cc in her 650 cc expander and she is fairly happy with the size.  She does want to have a reduction on the left side.   Review of Systems  Constitutional: Negative.   Eyes: Negative.   Respiratory: Negative.    Cardiovascular: Negative.   Gastrointestinal: Negative.   Endocrine: Negative.   Genitourinary: Negative.   Musculoskeletal: Negative.        Objective:   Physical Exam Constitutional:      Appearance: Normal appearance.  HENT:     Head: Atraumatic.  Cardiovascular:     Rate and Rhythm: Normal rate.     Pulses: Normal pulses.  Pulmonary:     Effort: Pulmonary effort is normal.  Skin:    General: Skin is warm.     Capillary Refill: Capillary refill takes less than 2 seconds.     Coloration: Skin is not jaundiced.     Findings: No bruising or lesion.  Neurological:     Mental Status: She is alert and oriented to person, place, and time.  Psychiatric:        Mood and Affect: Mood normal.        Behavior: Behavior normal.        Thought Content: Thought content normal.        Judgment: Judgment normal.         Assessment & Plan:     ICD-10-CM   1. Ductal carcinoma in situ (DCIS) of right breast  D05.11        We placed injectable saline in the Expander using a sterile technique: Right: 50 cc for a total of 670 / 650 cc  Plan for exchange of right breast expander to implant and left breast reduction.  I would likely need liposuction to fix the medial aspect of the right  breast.

## 2023-04-04 ENCOUNTER — Encounter: Payer: Self-pay | Admitting: Surgical

## 2023-04-04 ENCOUNTER — Ambulatory Visit (INDEPENDENT_AMBULATORY_CARE_PROVIDER_SITE_OTHER): Payer: Medicare Other | Admitting: Surgical

## 2023-04-04 VITALS — BP 157/83 | HR 74 | Ht 62.0 in | Wt 204.0 lb

## 2023-04-04 DIAGNOSIS — Z9889 Other specified postprocedural states: Secondary | ICD-10-CM

## 2023-04-04 DIAGNOSIS — D0511 Intraductal carcinoma in situ of right breast: Secondary | ICD-10-CM

## 2023-04-04 MED ORDER — CEPHALEXIN 500 MG PO CAPS: 500.0000 mg | ORAL_CAPSULE | Freq: Four times a day (QID) | ORAL | 0 refills | Status: AC

## 2023-04-04 NOTE — Progress Notes (Signed)
Patient ID: Janet Fowler, female    DOB: 01/27/1951, 72 y.o.   MRN: [id number]  Chief Complaint  Patient presents with   Pre-op Exam      ICD-10-CM   1. Ductal carcinoma in situ (DCIS) of right breast  D05.11     2. S/P breast reconstruction  Z98.890        History of Present Illness: Janet Fowler is a 72 y.o.  female  with a history of right breast reconstruction status post right mastectomy with tissue expander in place.  She presents for preoperative evaluation for upcoming procedure, exchange of right breast tissue expander for right breast implant and left breast reduction for symmetry, scheduled for 04/26/2023 with Dr.  Ulice Bold  The patient has not had problems with anesthesia. No history of DVT/PE.  No family history of DVT/PE.  No family or personal history of bleeding or clotting disorders.  Patient is not currently taking any blood thinners.  No history of CVA/MI.   Summary of Previous Visit: Patient has a total of 670/650 cc in her right breast tissue expander.  We measured her STN today, 36 cm on the left. She is allergic to Steri-Strips  PMH Significant for: History of right mastectomy with expander in place, hypertension  Patient denies any cardiac or pulmonary disease.  She reports she tolerated anesthesia well with her previous procedures.  She reports that she was able to read through the breast reduction, general surgical and Mentor implant checklist consent forms today and prior to today's appointment as she had the forms previously.  She has a few questions about some specific areas but otherwise feels comfortable with the risks.  Patient reports she has been feeling well lately, no recent changes to her health.  She is ready for surgery and to have the expander removed and a silicone implant placed.  Past Medical History: Allergies: Allergies  Allergen Reactions   Steri-Strip Compound Benzoin [Benzoin Compound] Other (See Comments)    Steri-Strips  cause blisters on the skin    Current Medications:  Current Outpatient Medications:    cephALEXin (KEFLEX) 500 MG capsule, Take 1 capsule (500 mg total) by mouth 4 (four) times daily for 5 days., Disp: 20 capsule, Rfl: 0   acetaminophen (TYLENOL) 325 MG tablet, Take 325 mg by mouth every 6 (six) hours as needed (pain.)., Disp: , Rfl:    amLODipine (NORVASC) 10 MG tablet, Take 1 tablet (10 mg total) by mouth daily., Disp: 90 tablet, Rfl: 1   diazepam (VALIUM) 2 MG tablet, Take 1 tablet (2 mg total) by mouth every 12 (twelve) hours as needed for up to 20 doses for muscle spasms., Disp: 20 tablet, Rfl: 0   diazepam (VALIUM) 2 MG tablet, Take 1 tablet (2 mg total) by mouth every 12 (twelve) hours as needed for muscle spasms., Disp: 10 tablet, Rfl: 0   ondansetron (ZOFRAN) 4 MG tablet, Take 1 tablet (4 mg total) by mouth every 8 (eight) hours as needed for up to 20 doses for nausea or vomiting., Disp: 20 tablet, Rfl: 0   traMADol (ULTRAM) 50 MG tablet, Take 1 tablet (50 mg total) by mouth every 8 (eight) hours as needed for up to 20 doses for moderate pain or severe pain., Disp: 20 tablet, Rfl: 0  Past Medical Problems: Past Medical History:  Diagnosis Date   Breast cancer (HCC)    Hypertension     Past Surgical History: Past Surgical History:  Procedure Laterality Date  BIOPSY THYROID Bilateral 2024   BREAST BIOPSY Right 10/30/2022   MM RT BREAST BX W LOC DEV 1ST LESION IMAGE BX SPEC STEREO GUIDE 10/30/2022 [facility]   BREAST BIOPSY Right 10/30/2022   MM RT BREAST BX W LOC DEV EA AD LESION IMG BX SPEC STEREO GUIDE 10/30/2022 [facility]   BREAST RECONSTRUCTION WITH PLACEMENT OF TISSUE EXPANDER AND FLEX HD (ACELLULAR HYDRATED DERMIS) Right 12/21/2022   Procedure: IMMEDIATE RIGHT BREAST RECONSTRUCTION WITH PLACEMENT OF TISSUE EXPANDER AND FLEX HD (ACELLULAR HYDRATED DERMIS);  Surgeon: Peggye Form, DO;  Location: [facility] OR;  Service: Plastics;  Laterality: Right;    COLONOSCOPY  2022   SENTINEL NODE BIOPSY Right 12/21/2022   Procedure: SENTINEL NODE BIOPSY;  Surgeon: Griselda Miner, MD;  Location: [facility] OR;  Service: General;  Laterality: Right;   TONSILLECTOMY     TOTAL MASTECTOMY Right 12/21/2022   Procedure: RIGHT MASTECTOMY;  Surgeon: Griselda Miner, MD;  Location: [facility] OR;  Service: General;  Laterality: Right;  GEN & PEC BLOCK    Social History: Social History   Socioeconomic History   Marital status: Single    Spouse name: Not on file   Number of children: 0   Years of education: Not on file   Highest education level: 12th grade  Occupational History   Occupation: Retired [employer] operational officer  Tobacco Use   Smoking status: Never   Smokeless tobacco: Never  Vaping Use   Vaping Use: Never used  Substance and Sexual Activity   Alcohol use: Never   Drug use: Never   Sexual activity: Not Currently    Birth control/protection: None  Other Topics Concern   Not on file  Social History Narrative   Not on file   Social Determinants of Health   Financial Resource Strain: Low Risk  (03/08/2023)   Overall Financial Resource Strain (CARDIA)    Difficulty of Paying Living Expenses: Not hard at all  Food Insecurity: No Food Insecurity (03/08/2023)   Hunger Vital Sign    Worried About Running Out of Food in the Last Year: Never true    Ran Out of Food in the Last Year: Never true  Transportation Needs: No Transportation Needs (03/08/2023)   PRAPARE - Administrator, Civil Service (Medical): No    Lack of Transportation (Non-Medical): No  Physical Activity: Insufficiently Active (03/08/2023)   Exercise Vital Sign    Days of Exercise per Week: 3 days    Minutes of Exercise per Session: 20 min  Stress: No Stress Concern Present (03/08/2023)   [employer] of Occupational Health - Occupational Stress Questionnaire    Feeling of Stress : Only a little  Social Connections: Moderately Integrated (03/08/2023)   Social Connection and Isolation Panel  [NHANES]    Frequency of Communication with Friends and Family: More than three times a week    Frequency of Social Gatherings with Friends and Family: Twice a week    Attends Religious Services: More than 4 times per year    Active Member of [employer] or Organizations: Yes    Attends Banker Meetings: More than 4 times per year    Marital Status: Never married  Intimate Partner Violence: Not At Risk (12/21/2022)   Humiliation, Afraid, Rape, and Kick questionnaire    Fear of Current or Ex-Partner: No    Emotionally Abused: No    Physically Abused: No    Sexually Abused: No    Family History: Family History  Problem Relation  Age of Onset   Hypertension Mother    Arthritis Mother    Stroke Father    Hypertension Father    Colon cancer Brother 69 - 54   Prostate cancer Brother 59 - 43   Prostate cancer Brother 46 - 79    Review of Systems: Review of Systems  Constitutional: Negative.   Respiratory: Negative.    Cardiovascular: Negative.   Gastrointestinal: Negative.   Neurological: Negative.     Physical Exam: Vital Signs BP (!) 157/83 (BP Location: Left Arm, Patient Position: Sitting, Cuff Size: Large) Comment: pt stated she was rushing this morning to get to her appt.  Pulse 74   Ht 5\' 2"  (1.575 m)   Wt 204 lb (92.5 kg)   SpO2 99%   BMI 37.31 kg/m   Physical Exam Constitutional:      General: Not in acute distress.    Appearance: Normal appearance. Not ill-appearing.  HENT:     Head: Normocephalic and atraumatic.  Eyes:     Pupils: Pupils are equal, round Neck:     Musculoskeletal: Normal range of motion.  Cardiovascular:     Rate and Rhythm: Normal rate    Pulses: Normal pulses.  Pulmonary:     Effort: Pulmonary effort is normal. No respiratory distress.  Musculoskeletal: Normal range of motion.  Skin:    General: Skin is warm and dry.     Findings: No erythema or rash.  Neurological:     General: No focal deficit present.     Mental Status:  Alert and oriented to person, place, and time. Mental status is at baseline.     Motor: No weakness.  Psychiatric:        Mood and Affect: Mood normal.        Behavior: Behavior normal.    Assessment/Plan: The patient is scheduled for exchange of right breast tissue expander for right breast implant, left breast reduction for symmetry with Dr. Ulice Bold.  Risks, benefits, and alternatives of procedure discussed, questions answered and consent obtained.    Smoking Status: Non-smoker; Counseling Given?  N/A Last Mammogram: Patient had screening mammogram October 17, 2022,; Results: No suspicious findings for malignancy in the left breast.  Caprini Score: 10; Risk Factors include: Age, family history of thrombosis (mother with history of blood clot in the leg), history of malignancy, BMI > 25, and length of planned surgery. Recommendation for mechanical possible pharmacological prophylaxis. Encourage early ambulation.   Pictures obtained: Pictures previously taken  Post-op Rx sent to pharmacy:  Keflex.  Patient reports she still has prescription for tramadol, Zofran and Valium at home from previous surgery.  Patient was provided with the breast reconstruction and General Surgical Risk consent document and Pain Medication Agreement prior to their appointment.  They had adequate time to read through the risk consent documents and Pain Medication Agreement. We also discussed them in person together during this preop appointment. All of their questions were answered to their satisfaction.  Recommended calling if they have any further questions.  Risk consent form and Pain Medication Agreement to be scanned into patient's chart.  The risks that can be encountered with and after placement of a breast implant were discussed and include the following but not limited to these: bleeding, infection, delayed healing, anesthesia risks, skin sensation changes, injury to structures including nerves, blood  vessels, and muscles which may be temporary or permanent, allergies to tape, suture materials and glues, blood products, topical preparations or injected agents, skin contour irregularities,  skin discoloration and swelling, deep vein thrombosis, cardiac and pulmonary complications, pain, which may persist, fluid accumulation, wrinkling of the skin over the implanmt, changes in nipple or breast sensation, implant leakage or rupture, faulty position of the implant, persistent pain, formation of tight scar tissue around the implant (capsular contracture).  Patient was provided with the Mentor implant patient decision checklist and this was completed during today's preoperative evaluation. Patient had time to read through the information and any questions were answered to their content. Form will be scanned into patient's chart.  The risk that can be encountered with breast reduction were discussed and include the following but not limited to these:  Breast asymmetry, fluid accumulation, firmness of the breast, inability to breast feed, loss of nipple or areola, skin loss, decrease or no nipple sensation, fat necrosis of the breast tissue, bleeding, infection, healing delay.  There are risks of anesthesia, changes to skin sensation and injury to nerves or blood vessels.  The muscle can be temporarily or permanently injured.  You may have an allergic reaction to tape, suture, glue, blood products which can result in skin discoloration, swelling, pain, skin lesions, poor healing.  Any of these can lead to the need for revisonal surgery or stage procedures.  A reduction has potential to interfere with diagnostic procedures.  Nipple or breast piercing can increase risks of infection.  This procedure is best done when the breast is fully developed.  Changes in the breast will continue to occur over time.  Pregnancy can alter the outcomes of previous breast reduction surgery, weight gain and weigh loss can also effect the  long term appearance.   We discussed the possibility of amputation/free nipple graft technique due to the length of her STN.  She is understanding of the possibility that we would need to transition from a pedicle technique to a free nipple graft technique intraoperatively.  We discussed the risks associated with free nipple graft breast reductions, including but not limited to failure of the graft, partial loss of the graft, loss of sensation of bilateral nipple areola, complete loss of the nipple areola graft, inability to breast-feed, postoperative wounds, ongoing wound care.  We also discussed the risks associated with the pedicle technique.  We discussed that with the pedicle technique she could develop nipple areolar necrosis which would result in loss of the nipple, this would also result in ongoing wound care and possible changes in the shape of her breast.     Electronically signed by: Kermit Balo Jailyn Langhorst, PA-C 04/04/2023 10:00 AM

## 2023-04-04 NOTE — H&P (View-Only) (Signed)
Patient ID: Janet Fowler, female    DOB: 01/27/1951, 72 y.o.   MRN: [id number]  Chief Complaint  Patient presents with   Pre-op Exam      ICD-10-CM   1. Ductal carcinoma in situ (DCIS) of right breast  D05.11     2. S/P breast reconstruction  Z98.890        History of Present Illness: Janet Fowler is a 72 y.o.  female  with a history of right breast reconstruction status post right mastectomy with tissue expander in place.  She presents for preoperative evaluation for upcoming procedure, exchange of right breast tissue expander for right breast implant and left breast reduction for symmetry, scheduled for 04/26/2023 with Dr.  Ulice Bold  The patient has not had problems with anesthesia. No history of DVT/PE.  No family history of DVT/PE.  No family or personal history of bleeding or clotting disorders.  Patient is not currently taking any blood thinners.  No history of CVA/MI.   Summary of Previous Visit: Patient has a total of 670/650 cc in her right breast tissue expander.  We measured her STN today, 36 cm on the left. She is allergic to Steri-Strips  PMH Significant for: History of right mastectomy with expander in place, hypertension  Patient denies any cardiac or pulmonary disease.  She reports she tolerated anesthesia well with her previous procedures.  She reports that she was able to read through the breast reduction, general surgical and Mentor implant checklist consent forms today and prior to today's appointment as she had the forms previously.  She has a few questions about some specific areas but otherwise feels comfortable with the risks.  Patient reports she has been feeling well lately, no recent changes to her health.  She is ready for surgery and to have the expander removed and a silicone implant placed.  Past Medical History: Allergies: Allergies  Allergen Reactions   Steri-Strip Compound Benzoin [Benzoin Compound] Other (See Comments)    Steri-Strips  cause blisters on the skin    Current Medications:  Current Outpatient Medications:    cephALEXin (KEFLEX) 500 MG capsule, Take 1 capsule (500 mg total) by mouth 4 (four) times daily for 5 days., Disp: 20 capsule, Rfl: 0   acetaminophen (TYLENOL) 325 MG tablet, Take 325 mg by mouth every 6 (six) hours as needed (pain.)., Disp: , Rfl:    amLODipine (NORVASC) 10 MG tablet, Take 1 tablet (10 mg total) by mouth daily., Disp: 90 tablet, Rfl: 1   diazepam (VALIUM) 2 MG tablet, Take 1 tablet (2 mg total) by mouth every 12 (twelve) hours as needed for up to 20 doses for muscle spasms., Disp: 20 tablet, Rfl: 0   diazepam (VALIUM) 2 MG tablet, Take 1 tablet (2 mg total) by mouth every 12 (twelve) hours as needed for muscle spasms., Disp: 10 tablet, Rfl: 0   ondansetron (ZOFRAN) 4 MG tablet, Take 1 tablet (4 mg total) by mouth every 8 (eight) hours as needed for up to 20 doses for nausea or vomiting., Disp: 20 tablet, Rfl: 0   traMADol (ULTRAM) 50 MG tablet, Take 1 tablet (50 mg total) by mouth every 8 (eight) hours as needed for up to 20 doses for moderate pain or severe pain., Disp: 20 tablet, Rfl: 0  Past Medical Problems: Past Medical History:  Diagnosis Date   Breast cancer (HCC)    Hypertension     Past Surgical History: Past Surgical History:  Procedure Laterality Date  BIOPSY THYROID Bilateral 2024   BREAST BIOPSY Right 10/30/2022   MM RT BREAST BX W LOC DEV 1ST LESION IMAGE BX SPEC STEREO GUIDE 10/30/2022 [facility]   BREAST BIOPSY Right 10/30/2022   MM RT BREAST BX W LOC DEV EA AD LESION IMG BX SPEC STEREO GUIDE 10/30/2022 [facility]   BREAST RECONSTRUCTION WITH PLACEMENT OF TISSUE EXPANDER AND FLEX HD (ACELLULAR HYDRATED DERMIS) Right 12/21/2022   Procedure: IMMEDIATE RIGHT BREAST RECONSTRUCTION WITH PLACEMENT OF TISSUE EXPANDER AND FLEX HD (ACELLULAR HYDRATED DERMIS);  Surgeon: Peggye Form, DO;  Location: [facility] OR;  Service: Plastics;  Laterality: Right;    COLONOSCOPY  2022   SENTINEL NODE BIOPSY Right 12/21/2022   Procedure: SENTINEL NODE BIOPSY;  Surgeon: Griselda Miner, MD;  Location: [facility] OR;  Service: General;  Laterality: Right;   TONSILLECTOMY     TOTAL MASTECTOMY Right 12/21/2022   Procedure: RIGHT MASTECTOMY;  Surgeon: Griselda Miner, MD;  Location: [facility] OR;  Service: General;  Laterality: Right;  GEN & PEC BLOCK    Social History: Social History   Socioeconomic History   Marital status: Single    Spouse name: Not on file   Number of children: 0   Years of education: Not on file   Highest education level: 12th grade  Occupational History   Occupation: Retired [employer] operational officer  Tobacco Use   Smoking status: Never   Smokeless tobacco: Never  Vaping Use   Vaping Use: Never used  Substance and Sexual Activity   Alcohol use: Never   Drug use: Never   Sexual activity: Not Currently    Birth control/protection: None  Other Topics Concern   Not on file  Social History Narrative   Not on file   Social Determinants of Health   Financial Resource Strain: Low Risk  (03/08/2023)   Overall Financial Resource Strain (CARDIA)    Difficulty of Paying Living Expenses: Not hard at all  Food Insecurity: No Food Insecurity (03/08/2023)   Hunger Vital Sign    Worried About Running Out of Food in the Last Year: Never true    Ran Out of Food in the Last Year: Never true  Transportation Needs: No Transportation Needs (03/08/2023)   PRAPARE - Administrator, Civil Service (Medical): No    Lack of Transportation (Non-Medical): No  Physical Activity: Insufficiently Active (03/08/2023)   Exercise Vital Sign    Days of Exercise per Week: 3 days    Minutes of Exercise per Session: 20 min  Stress: No Stress Concern Present (03/08/2023)   [employer] of Occupational Health - Occupational Stress Questionnaire    Feeling of Stress : Only a little  Social Connections: Moderately Integrated (03/08/2023)   Social Connection and Isolation Panel  [NHANES]    Frequency of Communication with Friends and Family: More than three times a week    Frequency of Social Gatherings with Friends and Family: Twice a week    Attends Religious Services: More than 4 times per year    Active Member of [employer] or Organizations: Yes    Attends Banker Meetings: More than 4 times per year    Marital Status: Never married  Intimate Partner Violence: Not At Risk (12/21/2022)   Humiliation, Afraid, Rape, and Kick questionnaire    Fear of Current or Ex-Partner: No    Emotionally Abused: No    Physically Abused: No    Sexually Abused: No    Family History: Family History  Problem Relation  Age of Onset   Hypertension Mother    Arthritis Mother    Stroke Father    Hypertension Father    Colon cancer Brother 69 - 54   Prostate cancer Brother 59 - 43   Prostate cancer Brother 46 - 79    Review of Systems: Review of Systems  Constitutional: Negative.   Respiratory: Negative.    Cardiovascular: Negative.   Gastrointestinal: Negative.   Neurological: Negative.     Physical Exam: Vital Signs BP (!) 157/83 (BP Location: Left Arm, Patient Position: Sitting, Cuff Size: Large) Comment: pt stated she was rushing this morning to get to her appt.  Pulse 74   Ht 5\' 2"  (1.575 m)   Wt 204 lb (92.5 kg)   SpO2 99%   BMI 37.31 kg/m   Physical Exam Constitutional:      General: Not in acute distress.    Appearance: Normal appearance. Not ill-appearing.  HENT:     Head: Normocephalic and atraumatic.  Eyes:     Pupils: Pupils are equal, round Neck:     Musculoskeletal: Normal range of motion.  Cardiovascular:     Rate and Rhythm: Normal rate    Pulses: Normal pulses.  Pulmonary:     Effort: Pulmonary effort is normal. No respiratory distress.  Musculoskeletal: Normal range of motion.  Skin:    General: Skin is warm and dry.     Findings: No erythema or rash.  Neurological:     General: No focal deficit present.     Mental Status:  Alert and oriented to person, place, and time. Mental status is at baseline.     Motor: No weakness.  Psychiatric:        Mood and Affect: Mood normal.        Behavior: Behavior normal.    Assessment/Plan: The patient is scheduled for exchange of right breast tissue expander for right breast implant, left breast reduction for symmetry with Dr. Ulice Bold.  Risks, benefits, and alternatives of procedure discussed, questions answered and consent obtained.    Smoking Status: Non-smoker; Counseling Given?  N/A Last Mammogram: Patient had screening mammogram October 17, 2022,; Results: No suspicious findings for malignancy in the left breast.  Caprini Score: 10; Risk Factors include: Age, family history of thrombosis (mother with history of blood clot in the leg), history of malignancy, BMI > 25, and length of planned surgery. Recommendation for mechanical possible pharmacological prophylaxis. Encourage early ambulation.   Pictures obtained: Pictures previously taken  Post-op Rx sent to pharmacy:  Keflex.  Patient reports she still has prescription for tramadol, Zofran and Valium at home from previous surgery.  Patient was provided with the breast reconstruction and General Surgical Risk consent document and Pain Medication Agreement prior to their appointment.  They had adequate time to read through the risk consent documents and Pain Medication Agreement. We also discussed them in person together during this preop appointment. All of their questions were answered to their satisfaction.  Recommended calling if they have any further questions.  Risk consent form and Pain Medication Agreement to be scanned into patient's chart.  The risks that can be encountered with and after placement of a breast implant were discussed and include the following but not limited to these: bleeding, infection, delayed healing, anesthesia risks, skin sensation changes, injury to structures including nerves, blood  vessels, and muscles which may be temporary or permanent, allergies to tape, suture materials and glues, blood products, topical preparations or injected agents, skin contour irregularities,  skin discoloration and swelling, deep vein thrombosis, cardiac and pulmonary complications, pain, which may persist, fluid accumulation, wrinkling of the skin over the implanmt, changes in nipple or breast sensation, implant leakage or rupture, faulty position of the implant, persistent pain, formation of tight scar tissue around the implant (capsular contracture).  Patient was provided with the Mentor implant patient decision checklist and this was completed during today's preoperative evaluation. Patient had time to read through the information and any questions were answered to their content. Form will be scanned into patient's chart.  The risk that can be encountered with breast reduction were discussed and include the following but not limited to these:  Breast asymmetry, fluid accumulation, firmness of the breast, inability to breast feed, loss of nipple or areola, skin loss, decrease or no nipple sensation, fat necrosis of the breast tissue, bleeding, infection, healing delay.  There are risks of anesthesia, changes to skin sensation and injury to nerves or blood vessels.  The muscle can be temporarily or permanently injured.  You may have an allergic reaction to tape, suture, glue, blood products which can result in skin discoloration, swelling, pain, skin lesions, poor healing.  Any of these can lead to the need for revisonal surgery or stage procedures.  A reduction has potential to interfere with diagnostic procedures.  Nipple or breast piercing can increase risks of infection.  This procedure is best done when the breast is fully developed.  Changes in the breast will continue to occur over time.  Pregnancy can alter the outcomes of previous breast reduction surgery, weight gain and weigh loss can also effect the  long term appearance.   We discussed the possibility of amputation/free nipple graft technique due to the length of her STN.  She is understanding of the possibility that we would need to transition from a pedicle technique to a free nipple graft technique intraoperatively.  We discussed the risks associated with free nipple graft breast reductions, including but not limited to failure of the graft, partial loss of the graft, loss of sensation of bilateral nipple areola, complete loss of the nipple areola graft, inability to breast-feed, postoperative wounds, ongoing wound care.  We also discussed the risks associated with the pedicle technique.  We discussed that with the pedicle technique she could develop nipple areolar necrosis which would result in loss of the nipple, this would also result in ongoing wound care and possible changes in the shape of her breast.     Electronically signed by: Kermit Balo Scheeler, PA-C 04/04/2023 10:00 AM

## 2023-04-18 ENCOUNTER — Encounter (HOSPITAL_BASED_OUTPATIENT_CLINIC_OR_DEPARTMENT_OTHER): Payer: Self-pay | Admitting: Plastic Surgery

## 2023-04-19 ENCOUNTER — Encounter (HOSPITAL_BASED_OUTPATIENT_CLINIC_OR_DEPARTMENT_OTHER)
Admission: RE | Admit: 2023-04-19 | Discharge: 2023-04-19 | Disposition: A | Discharge status: Home / Self Care | Payer: Medicare Other | Source: Ambulatory Visit | Attending: Plastic Surgery | Admitting: Plastic Surgery

## 2023-04-19 DIAGNOSIS — I10 Essential (primary) hypertension: Secondary | ICD-10-CM | POA: Diagnosis not present

## 2023-04-19 DIAGNOSIS — Z01810 Encounter for preprocedural cardiovascular examination: Secondary | ICD-10-CM | POA: Diagnosis not present

## 2023-04-24 ENCOUNTER — Ambulatory Visit: Payer: Medicare Other | Admitting: Internal Medicine

## 2023-04-25 NOTE — Anesthesia Preprocedure Evaluation (Signed)
Anesthesia Evaluation  Patient identified by MRN, date of birth, ID band Patient awake    Reviewed: Allergy & Precautions, NPO status , Patient's Chart, lab work & pertinent test results  Airway Mallampati: II  TM Distance: >3 FB Neck ROM: Full    Dental no notable dental hx. (+) Teeth Intact, Dental Advisory Given   Pulmonary neg pulmonary ROS   Pulmonary exam normal breath sounds clear to auscultation       Cardiovascular hypertension, Normal cardiovascular exam Rhythm:Regular Rate:Normal     Neuro/Psych negative neurological ROS  negative psych ROS   GI/Hepatic negative GI ROS,,,  Endo/Other    Renal/GU      Musculoskeletal   Abdominal  (+) + obese  Peds  Hematology   Anesthesia Other Findings All: benzoin  Reproductive/Obstetrics                             Anesthesia Physical Anesthesia Plan  ASA: 2  Anesthesia Plan: General   Post-op Pain Management: Precedex and Tylenol PO (pre-op)*   Induction: Intravenous  PONV Risk Score and Plan: 4 or greater and Treatment may vary due to age or medical condition, Ondansetron and Dexamethasone  Airway Management Planned: Oral ETT  Additional Equipment: None  Intra-op Plan:   Post-operative Plan: Extubation in OR  Informed Consent: I have reviewed the patients History and Physical, chart, labs and discussed the procedure including the risks, benefits and alternatives for the proposed anesthesia with the patient or authorized representative who has indicated his/her understanding and acceptance.     Dental advisory given  Plan Discussed with:   Anesthesia Plan Comments:         Anesthesia Quick Evaluation

## 2023-04-26 ENCOUNTER — Ambulatory Visit (HOSPITAL_BASED_OUTPATIENT_CLINIC_OR_DEPARTMENT_OTHER)
Admission: RE | Admit: 2023-04-26 | Discharge: 2023-04-26 | Disposition: A | Discharge status: Home / Self Care | Payer: Medicare Other | Attending: Plastic Surgery | Admitting: Plastic Surgery

## 2023-04-26 ENCOUNTER — Ambulatory Visit (HOSPITAL_BASED_OUTPATIENT_CLINIC_OR_DEPARTMENT_OTHER): Payer: Medicare Other | Admitting: Anesthesiology

## 2023-04-26 ENCOUNTER — Other Ambulatory Visit: Payer: Self-pay

## 2023-04-26 ENCOUNTER — Encounter (HOSPITAL_BASED_OUTPATIENT_CLINIC_OR_DEPARTMENT_OTHER): Payer: Self-pay | Admitting: Plastic Surgery

## 2023-04-26 ENCOUNTER — Encounter (HOSPITAL_BASED_OUTPATIENT_CLINIC_OR_DEPARTMENT_OTHER)
Admission: RE | Disposition: A | Discharge status: Home / Self Care | Payer: Self-pay | Source: Home / Self Care | Attending: Plastic Surgery

## 2023-04-26 DIAGNOSIS — M542 Cervicalgia: Secondary | ICD-10-CM

## 2023-04-26 DIAGNOSIS — Z421 Encounter for breast reconstruction following mastectomy: Secondary | ICD-10-CM | POA: Insufficient documentation

## 2023-04-26 DIAGNOSIS — N62 Hypertrophy of breast: Secondary | ICD-10-CM | POA: Insufficient documentation

## 2023-04-26 DIAGNOSIS — N651 Disproportion of reconstructed breast: Secondary | ICD-10-CM

## 2023-04-26 DIAGNOSIS — Z86000 Personal history of in-situ neoplasm of breast: Secondary | ICD-10-CM | POA: Diagnosis not present

## 2023-04-26 DIAGNOSIS — I10 Essential (primary) hypertension: Secondary | ICD-10-CM | POA: Insufficient documentation

## 2023-04-26 DIAGNOSIS — Z9011 Acquired absence of right breast and nipple: Secondary | ICD-10-CM | POA: Diagnosis not present

## 2023-04-26 DIAGNOSIS — Z853 Personal history of malignant neoplasm of breast: Secondary | ICD-10-CM

## 2023-04-26 DIAGNOSIS — D0511 Intraductal carcinoma in situ of right breast: Secondary | ICD-10-CM | POA: Diagnosis not present

## 2023-04-26 DIAGNOSIS — M549 Dorsalgia, unspecified: Secondary | ICD-10-CM | POA: Insufficient documentation

## 2023-04-26 HISTORY — PX: REMOVAL OF TISSUE EXPANDER AND PLACEMENT OF IMPLANT: SHX6457

## 2023-04-26 HISTORY — PX: LIPOSUCTION: SHX10

## 2023-04-26 HISTORY — PX: BREAST REDUCTION SURGERY: SHX8

## 2023-04-26 SURGERY — REMOVAL, TISSUE EXPANDER, BREAST, WITH IMPLANT INSERTION
Anesthesia: General | Site: Breast | Laterality: Right

## 2023-04-26 MED ORDER — ONDANSETRON HCL 4 MG/2ML IJ SOLN
4.0000 mg | Freq: Once | INTRAMUSCULAR | Status: AC | PRN
  Administered 2023-04-26: 4 mg via INTRAVENOUS

## 2023-04-26 MED ORDER — EPINEPHRINE PF 1 MG/ML IJ SOLN
INTRAMUSCULAR | Status: AC
  Filled 2023-04-26: qty 1

## 2023-04-26 MED ORDER — KETOROLAC TROMETHAMINE 30 MG/ML IJ SOLN
15.0000 mg | Freq: Once | INTRAMUSCULAR | Status: AC | PRN
  Administered 2023-04-26: 15 mg via INTRAVENOUS

## 2023-04-26 MED ORDER — MIDAZOLAM HCL 5 MG/5ML IJ SOLN
INTRAMUSCULAR | Status: DC | PRN
  Administered 2023-04-26: 1 mg via INTRAVENOUS

## 2023-04-26 MED ORDER — CEFAZOLIN SODIUM-DEXTROSE 2-4 GM/100ML-% IV SOLN
2.0000 g | INTRAVENOUS | Status: AC
  Administered 2023-04-26: 2 g via INTRAVENOUS

## 2023-04-26 MED ORDER — PROPOFOL 10 MG/ML IV BOLUS
INTRAVENOUS | Status: AC
  Filled 2023-04-26: qty 20

## 2023-04-26 MED ORDER — FENTANYL CITRATE (PF) 100 MCG/2ML IJ SOLN
INTRAMUSCULAR | Status: AC
  Filled 2023-04-26: qty 2

## 2023-04-26 MED ORDER — LIDOCAINE HCL (PF) 1 % IJ SOLN
INTRAMUSCULAR | Status: AC
  Filled 2023-04-26: qty 60

## 2023-04-26 MED ORDER — DEXMEDETOMIDINE HCL IN NACL 80 MCG/20ML IV SOLN
INTRAVENOUS | Status: DC | PRN
  Administered 2023-04-26: 6 ug via INTRAVENOUS

## 2023-04-26 MED ORDER — LIDOCAINE-EPINEPHRINE 1 %-1:100000 IJ SOLN
INTRAMUSCULAR | Status: DC | PRN
  Administered 2023-04-26: 20 mL

## 2023-04-26 MED ORDER — KETOROLAC TROMETHAMINE 30 MG/ML IJ SOLN
INTRAMUSCULAR | Status: AC
  Filled 2023-04-26: qty 1

## 2023-04-26 MED ORDER — HYDROMORPHONE HCL 1 MG/ML IJ SOLN
INTRAMUSCULAR | Status: AC
  Filled 2023-04-26: qty 0.5

## 2023-04-26 MED ORDER — PROPOFOL 10 MG/ML IV BOLUS
INTRAVENOUS | Status: DC | PRN
Start: 2023-04-26 — End: 2023-04-26
  Administered 2023-04-26: 200 mg via INTRAVENOUS
  Administered 2023-04-26 (×2): 50 mg via INTRAVENOUS

## 2023-04-26 MED ORDER — VASHE WOUND IRRIGATION OPTIME
TOPICAL | Status: DC | PRN
  Administered 2023-04-26: 34 [oz_av]

## 2023-04-26 MED ORDER — LIDOCAINE 2% (20 MG/ML) 5 ML SYRINGE
INTRAMUSCULAR | Status: DC | PRN
  Administered 2023-04-26: 80 mg via INTRAVENOUS

## 2023-04-26 MED ORDER — DEXAMETHASONE SODIUM PHOSPHATE 4 MG/ML IJ SOLN
INTRAMUSCULAR | Status: DC | PRN
  Administered 2023-04-26: 5 mg via INTRAVENOUS

## 2023-04-26 MED ORDER — OXYCODONE HCL 5 MG/5ML PO SOLN: 5.0000 mg | Freq: Once | ORAL | Status: DC | PRN

## 2023-04-26 MED ORDER — PHENYLEPHRINE HCL (PRESSORS) 10 MG/ML IV SOLN
INTRAVENOUS | Status: DC | PRN
  Administered 2023-04-26: 80 ug via INTRAVENOUS

## 2023-04-26 MED ORDER — CHLORHEXIDINE GLUCONATE 4 % EX SOLN: 1.0000 | Freq: Once | CUTANEOUS | Status: DC

## 2023-04-26 MED ORDER — ONDANSETRON HCL 4 MG/2ML IJ SOLN
INTRAMUSCULAR | Status: AC
  Filled 2023-04-26: qty 2

## 2023-04-26 MED ORDER — LIDOCAINE HCL 1 % IJ SOLN
INTRAVENOUS | Status: DC | PRN
  Administered 2023-04-26: 300 mL

## 2023-04-26 MED ORDER — HYDROMORPHONE HCL 1 MG/ML IJ SOLN
0.2500 mg | INTRAMUSCULAR | Status: DC | PRN
  Administered 2023-04-26: 0.25 mg via INTRAVENOUS

## 2023-04-26 MED ORDER — DEXMEDETOMIDINE HCL IN NACL 80 MCG/20ML IV SOLN
INTRAVENOUS | Status: AC
  Filled 2023-04-26: qty 20

## 2023-04-26 MED ORDER — OXYCODONE HCL 5 MG PO TABS: 5.0000 mg | ORAL_TABLET | Freq: Once | ORAL | Status: DC | PRN

## 2023-04-26 MED ORDER — MIDAZOLAM HCL 2 MG/2ML IJ SOLN
INTRAMUSCULAR | Status: AC
  Filled 2023-04-26: qty 2

## 2023-04-26 MED ORDER — FENTANYL CITRATE (PF) 100 MCG/2ML IJ SOLN
INTRAMUSCULAR | Status: DC | PRN
  Administered 2023-04-26 (×3): 50 ug via INTRAVENOUS

## 2023-04-26 MED ORDER — AMISULPRIDE (ANTIEMETIC) 5 MG/2ML IV SOLN
10.0000 mg | Freq: Once | INTRAVENOUS | Status: AC | PRN
  Administered 2023-04-26: 10 mg via INTRAVENOUS

## 2023-04-26 MED ORDER — LACTATED RINGERS IV SOLN: INTRAVENOUS | Status: DC

## 2023-04-26 MED ORDER — SODIUM CHLORIDE 0.9 % IV SOLN
INTRAVENOUS | Status: DC | PRN
  Administered 2023-04-26: 30 mL

## 2023-04-26 MED ORDER — AMISULPRIDE (ANTIEMETIC) 5 MG/2ML IV SOLN
INTRAVENOUS | Status: AC
  Filled 2023-04-26: qty 4

## 2023-04-26 MED ORDER — LIDOCAINE HCL (PF) 1 % IJ SOLN
INTRAMUSCULAR | Status: AC
  Filled 2023-04-26: qty 30

## 2023-04-26 SURGICAL SUPPLY — 84 items
ADH SKN CLS APL DERMABOND .7 (GAUZE/BANDAGES/DRESSINGS) ×6
AGENT HMST PWDR BTL CLGN 5GM (Miscellaneous) ×3 IMPLANT
BAG DECANTER FOR FLEXI CONT (MISCELLANEOUS) ×3 IMPLANT
BINDER BREAST LRG (GAUZE/BANDAGES/DRESSINGS) IMPLANT
BINDER BREAST MEDIUM (GAUZE/BANDAGES/DRESSINGS) IMPLANT
BINDER BREAST XLRG (GAUZE/BANDAGES/DRESSINGS) IMPLANT
BINDER BREAST XXLRG (GAUZE/BANDAGES/DRESSINGS) IMPLANT
BIOPATCH RED 1 DISK 7.0 (GAUZE/BANDAGES/DRESSINGS) IMPLANT
BLADE HEX COATED 2.75 (ELECTRODE) ×3 IMPLANT
BLADE KNIFE PERSONA 10 (BLADE) ×6 IMPLANT
BLADE SURG 15 STRL LF DISP TIS (BLADE) ×3 IMPLANT
BLADE SURG 15 STRL SS (BLADE) ×3
CANISTER SUCT 1200ML W/VALVE (MISCELLANEOUS) ×3 IMPLANT
CLEANSER WND VASHE 34 (WOUND CARE) IMPLANT
COLLAGEN CELLERATERX 5 GRAM (Miscellaneous) IMPLANT
COVER BACK TABLE 60X90IN (DRAPES) ×3 IMPLANT
COVER MAYO STAND STRL (DRAPES) ×3 IMPLANT
DERMABOND ADVANCED .7 DNX12 (GAUZE/BANDAGES/DRESSINGS) ×6 IMPLANT
DRAIN CHANNEL 19F RND (DRAIN) IMPLANT
DRAPE LAPAROSCOPIC ABDOMINAL (DRAPES) ×3 IMPLANT
DRSG MEPILEX POST OP 4X8 (GAUZE/BANDAGES/DRESSINGS) ×6 IMPLANT
DRSG OPSITE POSTOP 4X12 (GAUZE/BANDAGES/DRESSINGS) IMPLANT
ELECT BLADE 4.0 EZ CLEAN MEGAD (MISCELLANEOUS) ×3
ELECT COATED BLADE 2.86 ST (ELECTRODE) ×3 IMPLANT
ELECT REM PT RETURN 9FT ADLT (ELECTROSURGICAL) ×3
ELECTRODE BLDE 4.0 EZ CLN MEGD (MISCELLANEOUS) ×3 IMPLANT
ELECTRODE REM PT RTRN 9FT ADLT (ELECTROSURGICAL) ×3 IMPLANT
EVACUATOR SILICONE 100CC (DRAIN) IMPLANT
FUNNEL KELLER 2 DISP (MISCELLANEOUS) IMPLANT
GAUZE PAD ABD 8X10 STRL (GAUZE/BANDAGES/DRESSINGS) ×6 IMPLANT
GAUZE SPONGE 4X4 12PLY STRL LF (GAUZE/BANDAGES/DRESSINGS) IMPLANT
GLOVE BIO SURGEON STRL SZ 6.5 (GLOVE) ×9 IMPLANT
GLOVE BIO SURGEON STRL SZ7.5 (GLOVE) ×3 IMPLANT
GLOVE BIOGEL PI IND STRL 7.0 (GLOVE) ×3 IMPLANT
GLOVE SURG SS PI 7.0 STRL IVOR (GLOVE) IMPLANT
GOWN STRL REUS W/ TWL LRG LVL3 (GOWN DISPOSABLE) ×9 IMPLANT
GOWN STRL REUS W/ TWL XL LVL3 (GOWN DISPOSABLE) IMPLANT
GOWN STRL REUS W/TWL LRG LVL3 (GOWN DISPOSABLE) ×9
GOWN STRL REUS W/TWL XL LVL3 (GOWN DISPOSABLE) ×6
IMPL BREAST SILICONE 700CC (Breast) IMPLANT
IMPLANT BREAST SILICONE 700CC (Breast) ×3 IMPLANT
IV NS 1000ML (IV SOLUTION)
IV NS 1000ML BAXH (IV SOLUTION) IMPLANT
NDL FILTER BLUNT 18X1 1/2 (NEEDLE) ×3 IMPLANT
NDL HYPO 25X1 1.5 SAFETY (NEEDLE) ×3 IMPLANT
NDL SAFETY ECLIP 18X1.5 (MISCELLANEOUS) ×3 IMPLANT
NEEDLE FILTER BLUNT 18X1 1/2 (NEEDLE) ×3 IMPLANT
NEEDLE HYPO 25X1 1.5 SAFETY (NEEDLE) ×6 IMPLANT
NS IRRIG 1000ML POUR BTL (IV SOLUTION) IMPLANT
PACK BASIN DAY SURGERY FS (CUSTOM PROCEDURE TRAY) ×3 IMPLANT
PAD ALCOHOL SWAB (MISCELLANEOUS) IMPLANT
PAD FOAM SILICONE BACKED (GAUZE/BANDAGES/DRESSINGS) IMPLANT
PENCIL SMOKE EVACUATOR (MISCELLANEOUS) ×3 IMPLANT
PIN SAFETY STERILE (MISCELLANEOUS) IMPLANT
SIZER BREAST GEL HP 700CC (SIZER) ×3
SIZER BRST GEL HP 700CC (SIZER) IMPLANT
SLEEVE SCD COMPRESS KNEE MED (STOCKING) ×3 IMPLANT
SPIKE FLUID TRANSFER (MISCELLANEOUS) IMPLANT
SPONGE T-LAP 18X18 ~~LOC~~+RFID (SPONGE) ×6 IMPLANT
STRIP SUTURE WOUND CLOSURE 1/2 (MISCELLANEOUS) ×6 IMPLANT
SUT MNCRL AB 3-0 PS2 18 (SUTURE) IMPLANT
SUT MNCRL AB 4-0 PS2 18 (SUTURE) ×12 IMPLANT
SUT MON AB 3-0 SH 27 (SUTURE) ×12
SUT MON AB 3-0 SH27 (SUTURE) ×12 IMPLANT
SUT MON AB 5-0 PS2 18 (SUTURE) IMPLANT
SUT PDS 3-0 CT2 (SUTURE) ×18
SUT PDS AB 2-0 CT2 27 (SUTURE) IMPLANT
SUT PDS II 3-0 CT2 27 ABS (SUTURE) ×18 IMPLANT
SUT SILK 3 0 PS 1 (SUTURE) IMPLANT
SUT VIC AB 3-0 SH 27 (SUTURE)
SUT VIC AB 3-0 SH 27X BRD (SUTURE) IMPLANT
SUT VIC AB 4-0 PS2 18 (SUTURE) IMPLANT
SYR 3ML 23GX1 SAFETY (SYRINGE) IMPLANT
SYR 50ML LL SCALE MARK (SYRINGE) IMPLANT
SYR BULB IRRIG 60ML STRL (SYRINGE) ×3 IMPLANT
SYR CONTROL 10ML LL (SYRINGE) ×3 IMPLANT
TAPE MEASURE VINYL STERILE (MISCELLANEOUS) IMPLANT
TOWEL GREEN STERILE FF (TOWEL DISPOSABLE) ×6 IMPLANT
TRAY DSU PREP LF (CUSTOM PROCEDURE TRAY) ×3 IMPLANT
TUBE CONNECTING 20X1/4 (TUBING) ×3 IMPLANT
TUBING INFILTRATION IT-10001 (TUBING) IMPLANT
TUBING SET GRADUATE ASPIR 12FT (MISCELLANEOUS) IMPLANT
UNDERPAD 30X36 HEAVY ABSORB (UNDERPADS AND DIAPERS) ×6 IMPLANT
YANKAUER SUCT BULB TIP NO VENT (SUCTIONS) ×3 IMPLANT

## 2023-04-26 NOTE — Anesthesia Postprocedure Evaluation (Signed)
Anesthesia Post Note  Patient: Janet Fowler  Procedure(s) Performed: REMOVAL OF RIGHT BREAST TISSUE EXPANDER AND PLACEMENT OF IMPLANT (Right: Breast) MAMMARY REDUCTION LEFT BREAST (Left: Breast) LIPOSUCTION BILATERAL BREAST (Bilateral: Breast)     Patient location during evaluation: PACU Anesthesia Type: General Level of consciousness: awake and alert Pain management: pain level controlled Vital Signs Assessment: post-procedure vital signs reviewed and stable Respiratory status: spontaneous breathing, nonlabored ventilation, respiratory function stable and patient connected to nasal cannula oxygen Cardiovascular status: blood pressure returned to baseline and stable Postop Assessment: no apparent nausea or vomiting Anesthetic complications: no   No notable events documented.  Last Vitals:  Vitals:   04/26/23 1445 04/26/23 1502  BP: 131/67 104/79  Pulse: 71 74  Resp: 16 16  Temp: 36.6 C (!) 36.3 C  SpO2: 100% 98%    Last Pain:  Vitals:   04/26/23 1502  TempSrc:   PainSc: 2                  Trevor Iha

## 2023-04-26 NOTE — Discharge Instructions (Signed)

## 2023-04-26 NOTE — Op Note (Signed)
Op note:    DATE OF PROCEDURE: 04/26/2023  LOCATION: [facility]  SURGEON: Foster Simpson, DO  ASSISTANT: Evelena Leyden, PA  PREOPERATIVE DIAGNOSIS 1. Breast asymmetry after cancer treatment 2. Acquired absence of right breast 2. Macromastia, Back Pain  POSTOPERATIVE DIAGNOSIS Same as preoperative diagnosis  PROCEDURES 1. Left breast reduction for symmetry 500 g 2. Removal of right breast expander and placement of implant  IMPLANTS: Mentor Smooth Round Ultra High Profile Gel 700 cc.  COMPLICATIONS: None.  DRAINS: none  INDICATIONS FOR PROCEDURE Janet Fowler is a 72 y.o. year-old female born on [birth year],with a history of symptomatic macromastia with concominant back pain, neck pain, shoulder grooving from her bra.   MRN: [id number]  CONSENT Informed consent was obtained directly from the patient. The risks, benefits and alternatives were fully discussed. Specific risks including but not limited to bleeding, infection, hematoma, seroma, scarring, pain, nipple necrosis, asymmetry, poor cosmetic results, and need for further surgery were discussed. The patient's questions were answered.  DESCRIPTION OF PROCEDURE  Patient was brought into the operating room and rested on the operating room table in the supine position.  SCDs were placed and appropriate padding was performed.  Antibiotics were given. The patient underwent general anesthesia and the chest was prepped and draped in a sterile fashion.  A timeout was performed and all information was confirmed to be correct by those in the room.  Left side: Preoperative markings were confirmed.  Incision lines were injected with local containing epinephrine.  After waiting for vasoconstriction, the marked lines were incised with a #15 blade.  A Wise-pattern superomedial breast reduction was performed by de-epithelializing the pedicle, using bovie to create the superomedial pedicle, and removing breast  tissue from the superior, lateral, and inferior portions of the breast.  Care was taken to not undermine the breast pedicle. Hemostasis was achieved.  The nipple was gently rotated into position and the soft tissue was closed with 4-0 Monocryl.  The patient was sat upright and size and shape symmetry was confirmed.  The pocket was irrigated and hemostasis confirmed.   Tumescent was placed in the lateral breast.  Liposuction was performed for improved symmetry. The deep tissues were approximated with 3-0 PDS sutures. The skin was closed with deep dermal 3-0 Monocryl and subcuticular 4-0 Monocryl sutures.  Dermabond was applied.  A breast binder and ABDs were placed.  The nipple and skin flaps had good capillary refill at the end of the procedure.  The patient tolerated the procedure well. The patient was allowed to wake from anesthesia and taken to the recovery room in satisfactory condition.  Right:  The old mastectomy scar was excised and superior mastectomy and inferior mastectomy flaps were re-raised over the pectoralis major muscle and ADM. The ADM was split to expose the tissue expander which was removed. Inspection of the pocket showed a normal healthy capsule and good integration of the biologic matrix.   Circumferential capsulotomies were performed to allow for breast pocket expansion.  Measurements were made to confirm adequate pocket size for the implant dimensions.  Hemostasis was ensured with electrocautery.  The pocket was irrigated with Vashe.  Tumescent was placed in the lateral breast.  Liposuction was performed for improved symmetry.  New gloves were placed.  The implant was placed in the pocket and oriented appropriately. The pectoralis major muscle and ADM were closed with a 3-0 running PDS suture. The remaining skin was closed with 3-0 PDS deep dermal and 4-0 Monocryl subcuticular stitches.  Dermabond was applied.  A breast binder and ABD was applied.  The patient was allowed to wake from  anesthesia and taken to the recovery room in satisfactory condition.   The advanced practice practitioner (APP) assisted throughout the case.  The APP was essential in retraction and counter traction when needed to make the case progress smoothly.  This retraction and assistance made it possible to see the tissue plans for the procedure.  The assistance was needed for blood control, tissue re-approximation and assisted with closure of the incision site.

## 2023-04-26 NOTE — Interval H&P Note (Signed)
History and Physical Interval Note:  04/26/2023 11:09 AM  Janet Fowler  has presented today for surgery, with the diagnosis of DCIS R breast.  The various methods of treatment have been discussed with the patient and family. After consideration of risks, benefits and other options for treatment, the patient has consented to  Procedure(s): REMOVAL OF TISSUE EXPANDER AND PLACEMENT OF IMPLANT (Right) MAMMARY REDUCTION  (BREAST) (Left) as a surgical intervention.  The patient's history has been reviewed, patient examined, no change in status, stable for surgery.  I have reviewed the patient's chart and labs.  Questions were answered to the patient's satisfaction.     Alena Bills Sherwin Hollingshed

## 2023-04-26 NOTE — Anesthesia Procedure Notes (Signed)
Procedure Name: LMA Insertion Date/Time: 04/26/2023 11:58 AM  Performed by: Ronnette Hila, CRNAPre-anesthesia Checklist: Patient identified, Emergency Drugs available, Suction available and Patient being monitored Patient Re-evaluated:Patient Re-evaluated prior to induction Oxygen Delivery Method: Circle System Utilized Preoxygenation: Pre-oxygenation with 100% oxygen Induction Type: IV induction Ventilation: Mask ventilation without difficulty LMA: LMA inserted LMA Size: 4.0 Number of attempts: 1 Airway Equipment and Method: bite block Placement Confirmation: positive ETCO2 Tube secured with: Tape Dental Injury: Teeth and Oropharynx as per pre-operative assessment

## 2023-04-26 NOTE — Transfer of Care (Signed)
Immediate Anesthesia Transfer of Care Note  Patient: Janet Fowler  Procedure(s) Performed: REMOVAL OF RIGHT BREAST TISSUE EXPANDER AND PLACEMENT OF IMPLANT (Right: Breast) MAMMARY REDUCTION LEFT BREAST (Left: Breast) LIPOSUCTION BILATERAL BREAST (Bilateral: Breast)  Patient Location: PACU  Anesthesia Type:General  Level of Consciousness: awake, drowsy, and patient cooperative  Airway & Oxygen Therapy: Patient Spontanous Breathing and Patient connected to face mask oxygen  Post-op Assessment: Report given to RN and Post -op Vital signs reviewed and stable  Post vital signs: Reviewed and stable  Last Vitals:  Vitals Value Taken Time  BP 132/73 04/26/23 1339  Temp    Pulse 86 04/26/23 1340  Resp    SpO2 98 % 04/26/23 1340  Vitals shown include unfiled device data.  Last Pain:  Vitals:   04/26/23 1109  TempSrc: Temporal  PainSc: 0-No pain      Patients Stated Pain Goal: 2 (04/26/23 1109)  Complications: No notable events documented.

## 2023-04-27 ENCOUNTER — Encounter (HOSPITAL_BASED_OUTPATIENT_CLINIC_OR_DEPARTMENT_OTHER): Payer: Self-pay | Admitting: Plastic Surgery

## 2023-05-04 ENCOUNTER — Encounter: Payer: Self-pay | Admitting: Plastic Surgery

## 2023-05-04 ENCOUNTER — Ambulatory Visit: Payer: Medicare Other | Admitting: Plastic Surgery

## 2023-05-04 VITALS — BP 142/82 | HR 71 | Ht 62.0 in | Wt 210.6 lb

## 2023-05-04 DIAGNOSIS — D0511 Intraductal carcinoma in situ of right breast: Secondary | ICD-10-CM

## 2023-05-04 DIAGNOSIS — Z9889 Other specified postprocedural states: Secondary | ICD-10-CM

## 2023-05-04 NOTE — Progress Notes (Signed)
   Subjective:    Patient ID: Janet Fowler, female    DOB: [birth year], 72 y.o.   MRN: [id number]  The patient is a 72 year old female here for follow-up after undergoing removal of right breast expander with placement of an implant.  She also had a left breast reduction for symmetry with approximately 500 g removed.  This was done on July 25.  Skin looks good no sign of breakdown.  No sign of a hematoma or seroma.  She still has some swelling.      Review of Systems  Constitutional: Negative.   Eyes: Negative.   Respiratory: Negative.    Cardiovascular: Negative.   Genitourinary: Negative.        Objective:   Physical Exam Constitutional:      Appearance: Normal appearance.  Cardiovascular:     Rate and Rhythm: Normal rate.     Pulses: Normal pulses.  Pulmonary:     Effort: Pulmonary effort is normal.  Skin:    Capillary Refill: Capillary refill takes less than 2 seconds.  Neurological:     Mental Status: She is alert and oriented to person, place, and time.  Psychiatric:        Mood and Affect: Mood normal.        Behavior: Behavior normal.        Thought Content: Thought content normal.        Judgment: Judgment normal.           Assessment & Plan:     ICD-10-CM   1. 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  Continue with sports bra.  Plan for right sided revision in the next several months.

## 2023-05-16 ENCOUNTER — Encounter: Payer: Self-pay | Admitting: Surgical

## 2023-05-16 ENCOUNTER — Ambulatory Visit: Payer: Medicare Other | Admitting: Surgical

## 2023-05-16 VITALS — BP 142/82 | HR 85 | Ht 62.0 in | Wt 210.0 lb

## 2023-05-16 DIAGNOSIS — Z9889 Other specified postprocedural states: Secondary | ICD-10-CM

## 2023-05-16 DIAGNOSIS — D0511 Intraductal carcinoma in situ of right breast: Secondary | ICD-10-CM

## 2023-05-16 NOTE — Progress Notes (Signed)
Patient is a 72 year old female here for follow-up after removal of right breast tissue expander and placement of implant, left breast reduction for symmetry with Dr. Ulice Bold on 04/26/2023.  She is 3 weeks postop.  She had a smooth round ultrahigh profile gel 700 cc Mentor implant placed on the right She had 500 g of breast tissue removed from the left.  Patient reports she is doing really well today, she does not have any specific concerns.  She reports she feels much more symmetric after the reduction on the left side.  She reports that she does have some areas of irritation on the left breast where she she has a "poking" sensation.  She also reports some tingling and sensory changes of the superior right reconstructed breast pole  Chaperone present on exam On exam right breast mastectomy incision is intact and healing well.  Dermabond is noted.  There is no subcutaneous fluid collection noted.  She does have a right breast implant in place, implant is soft, mild tenderness noted with palpation.  No overlying skin changes noted.  There is no erythema or cellulitic changes noted.  She does have significant superior pole fullness at this time  Left breast incisions are intact and healing well, she does have some suture knots along the inframammary fold that are irritating her.  NAC is viable, no subcutaneous fluid collection noted palpation.  No erythema or cellulitic changes noted.   A/P:  Continue with compressive garments and avoid strenuous activities for 3 more weeks. Recommend following up in approximately 2 weeks for reevaluation.  There is no signs of infection or concern on exam.  All the patient's questions were answered to her content.  Monocryl suture knots were removed from the inframammary fold.  Recommend scheduling appointment in 3 months with Dr. Ulice Bold to discuss excision of excess skin of the right medial breast for improved shape.

## 2023-05-30 ENCOUNTER — Encounter: Payer: Self-pay | Admitting: Surgical

## 2023-05-30 ENCOUNTER — Ambulatory Visit (INDEPENDENT_AMBULATORY_CARE_PROVIDER_SITE_OTHER): Payer: Medicare Other | Admitting: Surgical

## 2023-05-30 VITALS — BP 127/72 | HR 75

## 2023-05-30 DIAGNOSIS — Z9889 Other specified postprocedural states: Secondary | ICD-10-CM

## 2023-05-30 DIAGNOSIS — D0511 Intraductal carcinoma in situ of right breast: Secondary | ICD-10-CM

## 2023-05-30 NOTE — Progress Notes (Signed)
Patient is a 72 year old female here for follow-up of removal of right breast tissue expander and placement of right breast implant, left breast reduction for symmetry with Dr. Ulice Bold on 04/26/2023.  She is 5 weeks postop.  She reports she is overall doing well in regards to recovery after surgery.  She does report that she has had a cold, but reports she is starting to feel better.  She reports that she is scheduled for an appointment with Dr. Ulice Bold in November to discuss further surgery for excess skin of the right breast medially and laterally.  Chaperone present on exam On exam right breast with surgically absent NAC, mastectomy incision is intact and healing well.  There is no erythema or cellulitic changes.  There is no subcutaneous fluid collection noted.  The implant is sitting slightly high on her chest wall, however it is soft and nontender.  Left breast with viable NAC, left breast reduction incisions intact and healing well.  No subcutaneous fluid collections noted.  No erythema or cellulitic changes noted to the left breast.  A/P:  Recommend continue with compressive garments and avoiding strenuous activities or heavy lifting for 1 more week.  After 1 week she can increase activity as tolerated.  Recommend reaching out to [employer] for post reconstructive bras, these may be more comfortable for her to begin wearing.  She will only need to wear compression during the day or when active.  She is scheduled to follow-up in a few months for reevaluation.  She is aware to call with questions or concerns.  Pictures were obtained of the patient and placed in the chart with the patient's or guardian's permission.

## 2023-06-19 ENCOUNTER — Other Ambulatory Visit: Payer: Self-pay

## 2023-06-19 DIAGNOSIS — D0511 Intraductal carcinoma in situ of right breast: Secondary | ICD-10-CM

## 2023-06-20 ENCOUNTER — Encounter: Payer: Self-pay | Admitting: Hematology

## 2023-06-20 ENCOUNTER — Inpatient Hospital Stay: Payer: Medicare Other | Attending: Hematology

## 2023-06-20 ENCOUNTER — Inpatient Hospital Stay: Payer: Medicare Other | Admitting: Hematology

## 2023-06-20 VITALS — BP 148/75 | HR 70 | Temp 97.8°F | Resp 14 | Ht 62.0 in | Wt 206.1 lb

## 2023-06-20 DIAGNOSIS — I10 Essential (primary) hypertension: Secondary | ICD-10-CM | POA: Insufficient documentation

## 2023-06-20 DIAGNOSIS — D0511 Intraductal carcinoma in situ of right breast: Secondary | ICD-10-CM | POA: Diagnosis not present

## 2023-06-20 DIAGNOSIS — Z1231 Encounter for screening mammogram for malignant neoplasm of breast: Secondary | ICD-10-CM | POA: Diagnosis not present

## 2023-06-20 DIAGNOSIS — Z79899 Other long term (current) drug therapy: Secondary | ICD-10-CM | POA: Insufficient documentation

## 2023-06-20 DIAGNOSIS — Z170 Estrogen receptor positive status [ER+]: Secondary | ICD-10-CM | POA: Insufficient documentation

## 2023-06-20 DIAGNOSIS — Z9011 Acquired absence of right breast and nipple: Secondary | ICD-10-CM | POA: Insufficient documentation

## 2023-06-20 DIAGNOSIS — Z79810 Long term (current) use of selective estrogen receptor modulators (SERMs): Secondary | ICD-10-CM | POA: Diagnosis not present

## 2023-06-20 DIAGNOSIS — Z923 Personal history of irradiation: Secondary | ICD-10-CM | POA: Insufficient documentation

## 2023-06-20 LAB — CBC WITH DIFFERENTIAL (CANCER CENTER ONLY)
Abs Immature Granulocytes: 0.01 10*3/uL (ref 0.00–0.07)
Basophils Absolute: 0 10*3/uL (ref 0.0–0.1)
Basophils Relative: 0 %
Eosinophils Absolute: 0.2 10*3/uL (ref 0.0–0.5)
Eosinophils Relative: 4 %
HCT: 41.6 % (ref 36.0–46.0)
Hemoglobin: 13.9 g/dL (ref 12.0–15.0)
Immature Granulocytes: 0 %
Lymphocytes Relative: 38 %
Lymphs Abs: 1.8 10*3/uL (ref 0.7–4.0)
MCH: 28.9 pg (ref 26.0–34.0)
MCHC: 33.4 g/dL (ref 30.0–36.0)
MCV: 86.5 fL (ref 80.0–100.0)
Monocytes Absolute: 0.4 10*3/uL (ref 0.1–1.0)
Monocytes Relative: 8 %
Neutro Abs: 2.3 10*3/uL (ref 1.7–7.7)
Neutrophils Relative %: 50 %
Platelet Count: 258 10*3/uL (ref 150–400)
RBC: 4.81 MIL/uL (ref 3.87–5.11)
RDW: 13 % (ref 11.5–15.5)
WBC Count: 4.8 10*3/uL (ref 4.0–10.5)
nRBC: 0 % (ref 0.0–0.2)

## 2023-06-20 LAB — CMP (CANCER CENTER ONLY)
ALT: 8 U/L (ref 0–44)
AST: 16 U/L (ref 15–41)
Albumin: 4.5 g/dL (ref 3.5–5.0)
Alkaline Phosphatase: 69 U/L (ref 38–126)
Anion gap: 7 (ref 5–15)
BUN: 12 mg/dL (ref 8–23)
CO2: 24 mmol/L (ref 22–32)
Calcium: 9.9 mg/dL (ref 8.9–10.3)
Chloride: 106 mmol/L (ref 98–111)
Creatinine: 0.9 mg/dL (ref 0.44–1.00)
GFR, Estimated: >60 mL/min (ref >60)
Glucose, Bld: 94 mg/dL (ref 70–99)
Potassium: 4 mmol/L (ref 3.5–5.1)
Sodium: 137 mmol/L (ref 135–145)
Total Bilirubin: 0.5 mg/dL (ref 0.3–1.2)
Total Protein: 8.6 g/dL — ABNORMAL HIGH (ref 6.5–8.1)

## 2023-06-20 MED ORDER — TAMOXIFEN CITRATE 10 MG PO TABS: 10.0000 mg | ORAL_TABLET | Freq: Every day | ORAL | 1 refills | Status: DC

## 2023-06-20 NOTE — Progress Notes (Signed)
[facility]   [phone] [phone]   Clinic Follow up Note   Patient Care Team: Marcine Matar, MD as PCP - General (Internal Medicine) Griselda Miner, MD as Consulting Physician (General Surgery) Malachy Mood, MD as Consulting Physician (Hematology) Dorothy Puffer, MD as Consulting Physician (Radiation Oncology) Pershing Proud, RN as Oncology Nurse Navigator Donnelly Angelica, RN as Oncology Nurse Navigator  Date of Service:  06/20/2023  CHIEF COMPLAINT: f/u of right breast DCIS   CURRENT THERAPY:  Tamoxifen 5 mg daily starting 06/2023 ASSESSMENT:  Janet Fowler is a 72 y.o. 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 -she underwent a right breast mastectomy on first 2024, which showed high-grade DCIS, solid type with necrosis and calcifications.  Negative for invasive carcinoma.  Margins were negative. -We discussed moderate risk of future breast cancer in left breast, I recommended low-dose tamoxifen 5 mg daily for 3 years, she is interested.  I called in today. -Continue cancer surveillance, she will do annual left breast mammogram -She may need additional corrective surgery in right breast, I told her to hold tamoxifen 2 weeks before and after surgery.    PLAN: -lab reviewed -CMP-reviewed - If having Surgery I recommend holding Tamoxifen two weeks before surgery and two weeks after surgery. -I will Prescribe Tamoxifen 10 mg, but pt will take only 5 mg daily for 3 years. - I order Mammogram Unilat L in 10/2023 -Survivorship with NP Lacie in 3 months  SUMMARY OF ONCOLOGIC HISTORY: Oncology History Overview Note   Cancer Staging  Ductal carcinoma in situ (DCIS) of right breast Staging form: Breast, AJCC 8th Edition - Clinical stage from 10/30/2022: Stage 0 (cTis (DCIS), cN0, cM0, G3, ER+, PR+, HER2: Not Assessed) - Signed by Malachy Mood,  MD on 11/07/2022 Stage prefix: Initial diagnosis Histologic grading system: 3 grade system     Ductal carcinoma in situ (DCIS) of right breast  10/30/2022 Cancer Staging   Staging form: Breast, AJCC 8th Edition - Clinical stage from 10/30/2022: Stage 0 (cTis (DCIS), cN0, cM0, G3, ER+, PR+, HER2: Not Assessed) - Signed by Malachy Mood, MD on 11/07/2022 Stage prefix: Initial diagnosis Histologic grading system: 3 grade system   11/06/2022 Initial Diagnosis   Ductal carcinoma in situ (DCIS) of right breast   12/21/2022 Pathology Results    FINAL MICROSCOPIC DIAGNOSIS:  A. RIGHT BREAST, MASTECTOMY: High-grade ductal carcinoma in situ, solid type with necrosis and calcifications, two foci Negative for invasive carcinoma DCIS involves 5 of 14 submitted blocks (excluding the benign nipple) Margins free (DCIS 0.75 mm from the inferior margin) Changes consistent with prior biopsies (coil clip and X clip) Prognostic markers (from report [id number]): Estrogen receptor positive, progesterone receptor positive  B. RIGHT AXILLARY SENTINEL LYMPH NODE #1, EXCISION: One benign lymph node, negative for carcinoma (0/1)  C. RIGHT AXILLARY SENTINEL LYMPH NODE, EXCISION: One benign lymph node with lipogranulomata, negative for carcinoma (0/1)  D. RIGHT AXILLARY SENTINEL LYMPH NODE, EXCISION: One benign lymph node, negative for carcinoma (0/1)  E. RIGHT AXILLARY SENTINEL LYMPH NODE, EXCISION: One benign lymph node, negative for carcinoma (0/1)  F. RIGHT AXILLARY SENTINEL LYMPH NODE #2, EXCISION: One benign lymph node with lipogranulomata, negative for carcinoma (0/1)  G. RIGHT AXILLARY SENTINEL LYMPH NODE, EXCISION: One benign lymph node with lipogranulomata, negative for carcinoma (0/1)  H. RIGHT AXILLARY SENTINEL LYMPH NODE, EXCISION: One benign lymph  node, negative for carcinoma (0/1)  ONCOLOGY TABLE:  DCIS OF THE BREAST:  Resection Procedure: Lumpectomy Specimen Laterality: Right Histologic  Type: Ductal carcinoma in situ Size of DCIS: DCIS involves 5 of 14 submitted blocks (excluding the nipple) Nuclear Grade: High-grade Necrosis: Present Margins: All margins negative for DCIS      Specify Closest Margin (required only if <64mm): DCIS 0.75 mm from the inferior margin Regional Lymph Nodes:      Number of Lymph Nodes Examined: 7      Number of Sentinel Nodes Examined (if applicable): 7      Number of Lymph Nodes with Macrometastases: 0      Number of Lymph Nodes with Micrometastases): 0      Number of Lymph Nodes with Isolated Tumor Cells (=0.2 mm or =200 cells): 0 Breast Biomarker Testing Performed on Previous Biopsy:      Testing performed on Case Number: [id number]      Estrogen Receptor: Positive (95%, strong staining)      Progesterone Receptor: Positive (30%, strong staining) Pathologic Stage Classification (pTNM, AJCC 8th Edition): pTis, pN0 Representative Tumor Block: A2, A7 Comment(s): (v4.4.0.0)    12/21/2022 Cancer Staging   Staging form: Breast, AJCC 8th Edition - Pathologic stage from 12/21/2022: Stage 0 (pTis (DCIS), pN0, cM0, G3, ER+, PR+, HER2: Not Assessed) - Signed by Malachy Mood, MD on 01/17/2023 Histologic grading system: 3 grade system Residual tumor (R): R0 - None      INTERVAL HISTORY:  SUSANNE Fowler is here for a follow up of right breast DCIS . She was last seen by me on 01/19/2023. She presents to the clinic alone. Pt state that she has implant in the rt breast and reduction done on the left breast.     All other systems were reviewed with the patient and are negative.  MEDICAL HISTORY:  Past Medical History:  Diagnosis Date   Breast cancer (HCC)    Hypertension     SURGICAL HISTORY: Past Surgical History:  Procedure Laterality Date   BIOPSY THYROID Bilateral 2024   BREAST BIOPSY Right 10/30/2022   MM RT BREAST BX W LOC DEV 1ST LESION IMAGE BX SPEC STEREO GUIDE 10/30/2022 [facility]   BREAST BIOPSY Right 10/30/2022   MM RT  BREAST BX W LOC DEV EA AD LESION IMG BX SPEC STEREO GUIDE 10/30/2022 [facility]   BREAST RECONSTRUCTION WITH PLACEMENT OF TISSUE EXPANDER AND FLEX HD (ACELLULAR HYDRATED DERMIS) Right 12/21/2022   Procedure: IMMEDIATE RIGHT BREAST RECONSTRUCTION WITH PLACEMENT OF TISSUE EXPANDER AND FLEX HD (ACELLULAR HYDRATED DERMIS);  Surgeon: Peggye Form, DO;  Location: [facility] OR;  Service: Plastics;  Laterality: Right;   BREAST REDUCTION SURGERY Left 04/26/2023   Procedure: MAMMARY REDUCTION LEFT BREAST;  Surgeon: Peggye Form, DO;  Location: [facility];  Service: Plastics;  Laterality: Left;   COLONOSCOPY  2022   LIPOSUCTION Bilateral 04/26/2023   Procedure: LIPOSUCTION BILATERAL BREAST;  Surgeon: Peggye Form, DO;  Location: [facility];  Service: Plastics;  Laterality: Bilateral;   REMOVAL OF TISSUE EXPANDER AND PLACEMENT OF IMPLANT Right 04/26/2023   Procedure: REMOVAL OF RIGHT BREAST TISSUE EXPANDER AND PLACEMENT OF IMPLANT;  Surgeon: Peggye Form, DO;  Location: [facility];  Service: Plastics;  Laterality: Right;   SENTINEL NODE BIOPSY Right 12/21/2022   Procedure: SENTINEL NODE BIOPSY;  Surgeon: Griselda Miner, MD;  Location: [facility] OR;  Service: General;  Laterality: Right;   TONSILLECTOMY  TOTAL MASTECTOMY Right 12/21/2022   Procedure: RIGHT MASTECTOMY;  Surgeon: Griselda Miner, MD;  Location: [facility] OR;  Service: General;  Laterality: Right;  GEN & PEC BLOCK    I have reviewed the social history and family history with the patient and they are unchanged from previous note.  ALLERGIES:  is allergic to steri-strip compound benzoin [benzoin].  MEDICATIONS:  Current Outpatient Medications  Medication Sig Dispense Refill   tamoxifen (NOLVADEX) 10 MG tablet Take 1 tablet (10 mg total) by mouth daily. 30 tablet 1   acetaminophen (TYLENOL) 325 MG tablet Take 325 mg by mouth every 6 (six) hours as needed (pain.).     amLODipine  (NORVASC) 10 MG tablet Take 1 tablet (10 mg total) by mouth daily. 90 tablet 1   diazepam (VALIUM) 2 MG tablet Take 1 tablet (2 mg total) by mouth every 12 (twelve) hours as needed for up to 20 doses for muscle spasms. 20 tablet 0   diazepam (VALIUM) 2 MG tablet Take 1 tablet (2 mg total) by mouth every 12 (twelve) hours as needed for muscle spasms. 10 tablet 0   ondansetron (ZOFRAN) 4 MG tablet Take 1 tablet (4 mg total) by mouth every 8 (eight) hours as needed for up to 20 doses for nausea or vomiting. 20 tablet 0   traMADol (ULTRAM) 50 MG tablet Take 1 tablet (50 mg total) by mouth every 8 (eight) hours as needed for up to 20 doses for moderate pain or severe pain. 20 tablet 0   No current facility-administered medications for this visit.    PHYSICAL EXAMINATION: ECOG PERFORMANCE STATUS: 0 - Asymptomatic  Vitals:   06/20/23 1423  BP: (!) 148/75  Pulse: 70  Resp: 14  Temp: 97.8 F (36.6 C)  SpO2: 99%   Wt Readings from Last 3 Encounters:  06/20/23 206 lb 1.6 oz (93.5 kg)  05/16/23 210 lb (95.3 kg)  05/04/23 210 lb 9.6 oz (95.5 kg)     GENERAL:alert, no distress and comfortable SKIN: skin color normal, no rashes or significant lesions EYES: normal, Conjunctiva are pink and non-injected, sclera clear  NEURO: alert & oriented x 3 with fluent speech LABORATORY DATA:  I have reviewed the data as listed    Latest Ref Rng & Units 06/20/2023    1:44 PM 12/18/2022    1:20 PM 11/08/2022   11:59 AM  CBC  WBC 4.0 - 10.5 K/uL 4.8  4.4  4.1   Hemoglobin 12.0 - 15.0 g/dL 32.4  40.1  02.7   Hematocrit 36.0 - 46.0 % 41.6  43.6  40.6   Platelets 150 - 400 K/uL 258  306  274         Latest Ref Rng & Units 06/20/2023    1:44 PM 12/18/2022    1:20 PM 11/08/2022   11:59 AM  CMP  Glucose 70 - 99 mg/dL 94  97  84   BUN 8 - 23 mg/dL 12  9  9    Creatinine 0.44 - 1.00 mg/dL 2.53  6.64  4.03   Sodium 135 - 145 mmol/L 137  135  139   Potassium 3.5 - 5.1 mmol/L 4.0  4.0  3.9   Chloride 98 - 111  mmol/L 106  104  106   CO2 22 - 32 mmol/L 24  24  27    Calcium 8.9 - 10.3 mg/dL 9.9  9.3  9.7   Total Protein 6.5 - 8.1 g/dL 8.6   7.6   Total Bilirubin  0.3 - 1.2 mg/dL 0.5   0.4   Alkaline Phos 38 - 126 U/L 69   70   AST 15 - 41 U/L 16   13   ALT 0 - 44 U/L 8   6       RADIOGRAPHIC STUDIES: I have personally reviewed the radiological images as listed and agreed with the findings in the report. No results found.    Orders Placed This Encounter  Procedures   MM Digital Screening Unilat L    Standing Status:   Future    Standing Expiration Date:   06/19/2024    Order Specific Question:   Reason for Exam (SYMPTOM  OR DIAGNOSIS REQUIRED)    Answer:   SCREENING    Order Specific Question:   Preferred imaging location?    Answer:   [facility]   All questions were answered. The patient knows to call the clinic with any problems, questions or concerns. No barriers to learning was detected. The total time spent in the appointment was 25 minutes.     Malachy Mood, MD 06/20/2023   Carolin Coy, CMA, am acting as scribe for Malachy Mood, MD.   I have reviewed the above documentation for accuracy and completeness, and I agree with the above.

## 2023-06-20 NOTE — Assessment & Plan Note (Addendum)
-  High grade, ER+/PR+ --Diagnosed in January 2024, discovered on screening mammogram. -she underwent a right breast mastectomy on first 2024, which showed high-grade DCIS, solid type with necrosis and calcifications.  Negative for invasive carcinoma.  Margins were negative. -We discussed moderate risk of future breast cancer in left breast, I recommended low-dose tamoxifen 5 mg daily for 3 years, she is interested.

## 2023-06-25 ENCOUNTER — Other Ambulatory Visit: Payer: Self-pay | Admitting: Hematology

## 2023-06-25 ENCOUNTER — Other Ambulatory Visit: Payer: Self-pay

## 2023-06-25 ENCOUNTER — Telehealth: Payer: Self-pay

## 2023-06-25 MED ORDER — ANASTROZOLE 1 MG PO TABS: 1.0000 mg | ORAL_TABLET | Freq: Every day | ORAL | 1 refills | Status: DC

## 2023-06-25 NOTE — Telephone Encounter (Signed)
Called the pt to inform her that Dr. Mosetta Putt called her in a different antiestrogen pill, call Anastrozole 1 mg daily. Pt verbalized understanding.   Rondel Jumbo, CMA

## 2023-06-25 NOTE — Telephone Encounter (Signed)
Patient called in this morning stating she started taking Tamoxifen after her last visit with Dr. Mosetta Putt. She took two doses cutting the pills in half for 5mg  and she started having leg cramps that caused her to have to walk with a cane. Patient stated she is not taking the medication any more and wanted to know what to do. Made Dr. Mosetta Putt, Santiago Glad and Vincent Gros aware.

## 2023-07-17 ENCOUNTER — Ambulatory Visit: Payer: Medicare Other | Admitting: Internal Medicine

## 2023-08-14 ENCOUNTER — Encounter: Payer: Self-pay | Admitting: Plastic Surgery

## 2023-08-14 ENCOUNTER — Ambulatory Visit: Payer: Medicare Other | Admitting: Plastic Surgery

## 2023-08-14 VITALS — BP 151/82 | HR 74

## 2023-08-14 DIAGNOSIS — Z9011 Acquired absence of right breast and nipple: Secondary | ICD-10-CM | POA: Diagnosis not present

## 2023-08-14 DIAGNOSIS — D0511 Intraductal carcinoma in situ of right breast: Secondary | ICD-10-CM

## 2023-08-14 DIAGNOSIS — N651 Disproportion of reconstructed breast: Secondary | ICD-10-CM | POA: Diagnosis not present

## 2023-08-14 NOTE — Progress Notes (Signed)
   Subjective:    Patient ID: Janet Fowler, female    DOB: 10/21/1950, 72 y.o.   MRN: [id number]  The patient is a 72 year old female here for follow-up after undergoing breast reconstruction.  She was diagnosed with right breast ductal carcinoma in situ that was grade 2-3.  It was estrogen and progesterone positive.  She is 5 feet 2 inches tall and weighs 204 pounds.  She underwent a right mastectomy in March 2024 and had an expander placed.  In July 2024 the expander was removed and she had a Mentor smooth round ultrahigh profile gel 700 cc implant placed and a left breast reduction with 500 g removed.  This did improve her symmetry but she had some very thick tissue on the medial aspect of the right breast.  Today it is much softer.  The patient is wondering if she can get a rounder look and I think that this is certainly possible.     Review of Systems  Constitutional: Negative.   HENT: Negative.    Eyes: Negative.   Respiratory: Negative.    Cardiovascular: Negative.   Gastrointestinal: Negative.   Endocrine: Negative.   Genitourinary: Negative.   Musculoskeletal: Negative.        Objective:   Physical Exam Vitals reviewed.  Constitutional:      Appearance: Normal appearance.  Cardiovascular:     Rate and Rhythm: Normal rate.     Pulses: Normal pulses.  Pulmonary:     Effort: Pulmonary effort is normal.  Abdominal:     Palpations: Abdomen is soft.  Musculoskeletal:        General: Deformity present. No swelling or tenderness.  Skin:    General: Skin is warm.     Capillary Refill: Capillary refill takes less than 2 seconds.     Coloration: Skin is not jaundiced.     Findings: No bruising or lesion.  Neurological:     Mental Status: She is alert and oriented to person, place, and time.  Psychiatric:        Mood and Affect: Mood normal.        Behavior: Behavior normal.        Assessment & Plan:     ICD-10-CM   1. Ductal carcinoma in situ (DCIS) of right  breast  D05.11     2. Acquired absence of right breast  Z90.11        Plan for excision of right breast excess breast tissue with fat grafting for improved symmetry.  Pictures were obtained of the patient and placed in the chart with the patient's or guardian's permission.

## 2023-08-21 ENCOUNTER — Other Ambulatory Visit: Payer: Self-pay | Admitting: Hematology

## 2023-08-23 ENCOUNTER — Ambulatory Visit: Payer: Medicare Other | Admitting: Internal Medicine

## 2023-09-12 ENCOUNTER — Inpatient Hospital Stay: Payer: Medicare HMO | Admitting: Nurse Practitioner

## 2023-09-20 ENCOUNTER — Ambulatory Visit: Payer: Medicare Other | Attending: Family Medicine | Admitting: Internal Medicine

## 2023-09-20 ENCOUNTER — Encounter: Payer: Self-pay | Admitting: Internal Medicine

## 2023-09-20 VITALS — BP 135/81 | HR 79 | Temp 97.7°F | Ht 62.0 in | Wt 212.0 lb

## 2023-09-20 DIAGNOSIS — E66812 Obesity, class 2: Secondary | ICD-10-CM | POA: Diagnosis not present

## 2023-09-20 DIAGNOSIS — H6123 Impacted cerumen, bilateral: Secondary | ICD-10-CM

## 2023-09-20 DIAGNOSIS — D0511 Intraductal carcinoma in situ of right breast: Secondary | ICD-10-CM | POA: Diagnosis not present

## 2023-09-20 DIAGNOSIS — Z23 Encounter for immunization: Secondary | ICD-10-CM

## 2023-09-20 DIAGNOSIS — I10 Essential (primary) hypertension: Secondary | ICD-10-CM | POA: Diagnosis not present

## 2023-09-20 DIAGNOSIS — E042 Nontoxic multinodular goiter: Secondary | ICD-10-CM

## 2023-09-20 DIAGNOSIS — M899 Disorder of bone, unspecified: Secondary | ICD-10-CM

## 2023-09-20 DIAGNOSIS — Z6838 Body mass index (BMI) 38.0-38.9, adult: Secondary | ICD-10-CM

## 2023-09-20 MED ORDER — AMLODIPINE BESYLATE 10 MG PO TABS
10.0000 mg | ORAL_TABLET | Freq: Every day | ORAL | 1 refills | Status: DC
Start: 2023-09-20 — End: 2024-04-21

## 2023-09-20 MED ORDER — ZOSTER VAC RECOMB ADJUVANTED 50 MCG/0.5ML IM SUSR
0.5000 mL | Freq: Once | INTRAMUSCULAR | 0 refills | Status: AC
Start: 2023-09-20 — End: 2023-09-20

## 2023-09-20 NOTE — Progress Notes (Signed)
Patient ID: Janet Fowler, female    DOB: 1951/07/31  MRN: [id number]  CC: Hypertension (HTN f/u. /No questions / concerns/Flu & tdap vax administered on 09/20/2023  C.A.  Yes to shingles vax)   Subjective: Janet Fowler is a 72 y.o. female who presents for chronic ds management. 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  On Arimidex)  Discussed the use of AI scribe software for clinical note transcription with the patient, who gave verbal consent to proceed.  History of Present Illness    RT Breast CA:  She underwent a breast reconstruction on RT and left breast reduction 04/2023. She reports a 'flab of skin' on the right side that is not only aesthetically displeasing but also occasionally painful.  Plastic surgeon plans to do another procedure to correct this.  Patient states that he will be liposuction. -On Arimidex.  HTN: Taking amlodipine 10mg  daily. She reports compliance with the medication and checks her blood pressure at home daily or every other day. Recent readings have been elevated, with a reading of 157 over something two days prior. She is attempting to limit salt intake and denies chest pain or shortness of breath.  Thyroid nodules:  LT thyroid nodule has been biopsied twice without obtaining a good sample. She has not yet followed up with a recommended thyroid surgeon Janet Fowler and expresses a lack of concern about the thyroid nodules at this time. Wants to hold on on further pursuing this. She also has a lesion on the skull/calvarium identified on CTA scan, but has not followed up with a recommended CT scan of the head due to claustrophobia and a desire to 'leave it alone.'  The patient has gained six pounds since the last visit and acknowledges the  need for dietary improvements. She reports avoiding fast food and cooking for herself, but admits to inconsistent eating habits and inadequate vegetable intake.     She would like for me to take a look in her ears.  She has a cracking sound at times in the right ear.  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    Allergies  Allergen Reactions   Steri-Strip Compound Benzoin [Benzoin] Other (See Comments)    Steri-Strips cause blisters on the skin    Social History   Socioeconomic History   Marital status: Single    Spouse name: Not on file   Number of children: 0   Years of education: Not on file   Highest education level: Bachelor's degree (e.g., BA, AB, BS)  Occupational History   Occupation: Retired Diplomatic Services operational officer  Tobacco Use   Smoking status: Never   Smokeless tobacco: Never  Vaping Use   Vaping status: Never Used  Substance and  Sexual Activity   Alcohol use: Never   Drug use: Never   Sexual activity: Not Currently    Birth control/protection: None  Other Topics Concern   Not on file  Social History Narrative   Not on file   Social Drivers of Health   Financial Resource Strain: Low Risk  (09/17/2023)   Overall Financial Resource Strain (CARDIA)    Difficulty of Paying Living Expenses: Not very hard  Recent Concern: Financial Resource Strain - Medium Risk (08/21/2023)   Overall Financial Resource Strain (CARDIA)    Difficulty  of Paying Living Expenses: Somewhat hard  Food Insecurity: No Food Insecurity (09/17/2023)   Hunger Vital Sign    Worried About Running Out of Food in the Last Year: Never true    Ran Out of Food in the Last Year: Never true  Transportation Needs: No Transportation Needs (09/17/2023)   PRAPARE - Administrator, Civil Service (Medical): No    Lack of Transportation (Non-Medical): No  Physical Activity: Inactive (09/17/2023)   Exercise Vital Sign    Days of Exercise per Week: 0 days    Minutes of Exercise per Session: 10 min  Stress: No Stress Concern Present (09/17/2023)   [employer] of Occupational Health - Occupational Stress Questionnaire    Feeling of Stress : Not at all  Social Connections: Moderately Integrated (09/17/2023)   Social Connection and Isolation Panel [NHANES]    Frequency of Communication with Fowler and Family: More than three times a week    Frequency of Social Gatherings with Fowler and Family: Twice a week    Attends Religious Services: More than 4 times per year    Active Member of Clubs or Organizations: Yes    Attends Banker Meetings: More than 4 times per year    Marital Status: Never married  Intimate Partner Violence: Not At Risk (12/21/2022)   Humiliation, Afraid, Rape, and Kick questionnaire    Fear of Current or Ex-Partner: No    Emotionally Abused: No    Physically Abused: No    Sexually Abused: No    Family History  Problem Relation Age of Onset   Hypertension Mother    Arthritis Mother    Stroke Father    Hypertension Father    Colon cancer Brother 55 - 15   Prostate cancer Brother 56 - 25   Prostate cancer Brother 42 - 77    Past Surgical History:  Procedure Laterality Date   BIOPSY THYROID Bilateral 2024   BREAST BIOPSY Right 10/30/2022   MM RT BREAST BX W LOC DEV 1ST LESION IMAGE BX SPEC STEREO GUIDE 10/30/2022 [facility]   BREAST BIOPSY Right 10/30/2022   MM RT BREAST BX W LOC DEV EA AD  LESION IMG BX SPEC STEREO GUIDE 10/30/2022 [facility]   BREAST RECONSTRUCTION WITH PLACEMENT OF TISSUE EXPANDER AND FLEX HD (ACELLULAR HYDRATED DERMIS) Right 12/21/2022   Procedure: IMMEDIATE RIGHT BREAST RECONSTRUCTION WITH PLACEMENT OF TISSUE EXPANDER AND FLEX HD (ACELLULAR HYDRATED DERMIS);  Surgeon: Peggye Form, DO;  Location: [facility];  Service: Plastics;  Laterality: Right;   BREAST REDUCTION SURGERY Left 04/26/2023   Procedure: MAMMARY REDUCTION LEFT BREAST;  Surgeon: Peggye Form, DO;  Location: [facility];  Service: Plastics;  Laterality: Left;   COLONOSCOPY  2022   LIPOSUCTION Bilateral 04/26/2023   Procedure: LIPOSUCTION BILATERAL BREAST;  Surgeon: Peggye Form, DO;  Location: [facility];  Service: Plastics;  Laterality: Bilateral;  REMOVAL OF TISSUE EXPANDER AND PLACEMENT OF IMPLANT Right 04/26/2023   Procedure: REMOVAL OF RIGHT BREAST TISSUE EXPANDER AND PLACEMENT OF IMPLANT;  Surgeon: Peggye Form, DO;  Location: [facility];  Service: Plastics;  Laterality: Right;   SENTINEL NODE BIOPSY Right 12/21/2022   Procedure: SENTINEL NODE BIOPSY;  Surgeon: Griselda Miner, MD;  Location: [facility];  Service: General;  Laterality: Right;   TONSILLECTOMY     TOTAL MASTECTOMY Right 12/21/2022   Procedure: RIGHT MASTECTOMY;  Surgeon: Griselda Miner, MD;  Location: [facility];  Service: General;  Laterality: Right;  GEN & PEC BLOCK    ROS: Review of Systems Negative except as stated above  PHYSICAL EXAM: BP 135/81   Pulse 79   Temp 97.7 F (36.5 C) (Oral)   Ht 5\' 2"  (1.575 m)   Wt 212 lb (96.2 kg)   SpO2 100%   BMI 38.78 kg/m   Wt Readings from Last 3 Encounters:  09/20/23 212 lb (96.2 kg)  06/20/23 206 lb 1.6 oz (93.5 kg)  05/16/23 210 lb (95.3 kg)    Physical Exam  General appearance - alert, well appearing, and in no distress Mental status - normal mood, behavior, speech, dress, motor activity, and  thought processes Neck - supple, no significant adenopathy.  No thyroid nodules appreciated on exam Ears: She has hard impacted wax in both ears obscuring view of the tympanic on both side Chest - clear to auscultation, no wheezes, rales or rhonchi, symmetric air entry Heart - normal rate, regular rhythm, normal S1, S2, no murmurs, rubs, clicks or gallops Extremities - peripheral pulses normal, no pedal edema, no clubbing or cyanosis      Latest Ref Rng & Units 06/20/2023    1:44 PM 12/18/2022    1:20 PM 11/08/2022   11:59 AM  CMP  Glucose 70 - 99 mg/dL 94  97  84   BUN 8 - 23 mg/dL 12  9  9    Creatinine 0.44 - 1.00 mg/dL 1.61  0.96  0.45   Sodium 135 - 145 mmol/L 137  135  139   Potassium 3.5 - 5.1 mmol/L 4.0  4.0  3.9   Chloride 98 - 111 mmol/L 106  104  106   CO2 22 - 32 mmol/L 24  24  27    Calcium 8.9 - 10.3 mg/dL 9.9  9.3  9.7   Total Protein 6.5 - 8.1 g/dL 8.6   7.6   Total Bilirubin 0.3 - 1.2 mg/dL 0.5   0.4   Alkaline Phos 38 - 126 U/L 69   70   AST 15 - 41 U/L 16   13   ALT 0 - 44 U/L 8   6    Lipid Panel     Component Value Date/Time   CHOL 203 (H) 10/18/2022 1616   TRIG 84 10/18/2022 1616   HDL 84 10/18/2022 1616   CHOLHDL 2.4 10/18/2022 1616   LDLCALC 104 (H) 10/18/2022 1616    CBC    Component Value Date/Time   WBC 4.8 06/20/2023 1344   WBC 4.4 12/18/2022 1320   RBC 4.81 06/20/2023 1344   HGB 13.9 06/20/2023 1344   HGB 14.3 05/23/2022 1121   HCT 41.6 06/20/2023 1344   HCT 42.7 05/23/2022 1121   PLT 258 06/20/2023 1344   PLT 260 05/23/2022 1121   MCV 86.5 06/20/2023 1344   MCV 88 05/23/2022 1121   MCH 28.9 06/20/2023 1344   MCHC 33.4 06/20/2023  1344   RDW 13.0 06/20/2023 1344   RDW 12.6 05/23/2022 1121   LYMPHSABS 1.8 06/20/2023 1344   MONOABS 0.4 06/20/2023 1344   EOSABS 0.2 06/20/2023 1344   BASOSABS 0.0 06/20/2023 1344    ASSESSMENT AND PLAN: 1. Essential hypertension (Primary) Not at goal but improved on recheck.  Patient not interested in  adding another medication at this time. Encourage adherence to low-salt diet. -Check blood pressure at home weekly and bring readings to next visit. -Consider adding another antihypertensive medication if blood pressure remains above goal despite lifestyle modifications. - amLODipine (NORVASC) 10 MG tablet; Take 1 tablet (10 mg total) by mouth daily.  Dispense: 90 tablet; Refill: 1  2. Multiple thyroid nodules Since patient to follow through on seeing Janet Fowler for an opinion on the left thyroid nodule, I recommend repeat thyroid ultrasound since it has been over a year from the last 1.  She is agreeable to this. - US THYROID; Future  3. Class 2 severe obesity due to excess calories with serious comorbidity and body mass index (BMI) of 38.0 to 38.9 in adult [facility]) Discussed and encouraged healthy eating habits.  Encouraged her to incorporate fresh vegetables into her diet daily with at least one of her meals.  Encouraged to incorporate fresh fruits with goal of 3 servings daily.  4. Ductal carcinoma in situ (DCIS) of right breast She is status postmastectomy and right breast reconstructive surgery.  Concerns about appearance and discomfort due to excess skin on the right side. Plan for further corrective surgery by the plastic surgeon. On Arimidex  5. Bilateral impacted cerumen Bilateral ear wax impaction causing ear popping. -Purchase over-the-counter wax softener (Debrox or Cerumenex) and use for 3-4 days. -Schedule appointment with registered nurse for ear flushing after using wax softener.  6. Encounter for immunization - Tdap vaccine greater than or equal to 7yo IM - Flu Vaccine Trivalent High Dose (Fluad)  7. Need for shingles vaccine Rxn given for her to get shingrix at any outside pharmacy - Zoster Vaccine Adjuvanted [facility]) injection; Inject 0.5 mLs into the muscle once for 1 dose.  Dispense: 0.5 mL; Refill: 0  8. Skull Lesion -pt declined rescheduling CT.  Does not wish to  pursue any further.  Patient was given the opportunity to ask questions.  Patient verbalized understanding of the plan and was able to repeat key elements of the plan.   This documentation was completed using Paediatric nurse.  Any transcriptional errors are unintentional.  Orders Placed This Encounter  Procedures   US THYROID   Tdap vaccine greater than or equal to 7yo IM   Flu Vaccine Trivalent High Dose (Fluad)     Requested Prescriptions   Signed Prescriptions Disp Refills   amLODipine (NORVASC) 10 MG tablet 90 tablet 1    Sig: Take 1 tablet (10 mg total) by mouth daily.   Zoster Vaccine Adjuvanted [facility]) injection 0.5 mL 0    Sig: Inject 0.5 mLs into the muscle once for 1 dose.    Return in about 4 months (around 01/19/2024) for Give appt with RN early next week to have ears flushed.  Jonah Blue, MD, FACP

## 2023-09-20 NOTE — Patient Instructions (Signed)
Purchase wax softener over-the-counter and use in both ears daily for the next 3 to 4 days to soften up the wax.  The wax softener is called Debrox or Cerumenex.  Healthy Eating, Adult Healthy eating may help you get and keep a healthy body weight, reduce the risk of chronic disease, and live a long and productive life. It is important to follow a healthy eating pattern. Your nutritional and calorie needs should be met mainly by different nutrient-rich foods. What are tips for following this plan? Reading food labels Read labels and choose the following: Reduced or low sodium products. Juices with 100% fruit juice. Foods with low saturated fats (<3 g per serving) and high polyunsaturated and monounsaturated fats. Foods with whole grains, such as whole wheat, cracked wheat, brown rice, and wild rice. Whole grains that are fortified with folic acid. This is recommended for females who are pregnant or who want to become pregnant. Read labels and do not eat or drink the following: Foods or drinks with added sugars. These include foods that contain brown sugar, corn sweetener, corn syrup, dextrose, fructose, glucose, high-fructose corn syrup, honey, invert sugar, lactose, malt syrup, maltose, molasses, raw sugar, sucrose, trehalose, or turbinado sugar. Limit your intake of added sugars to less than 10% of your total daily calories. Do not eat more than the following amounts of added sugar per day: 6 teaspoons (25 g) for females. 9 teaspoons (38 g) for males. Foods that contain processed or refined starches and grains. Refined grain products, such as white flour, degermed cornmeal, white bread, and white rice. Shopping Choose nutrient-rich snacks, such as vegetables, whole fruits, and nuts. Avoid high-calorie and high-sugar snacks, such as potato chips, fruit snacks, and candy. Use oil-based dressings and spreads on foods instead of solid fats such as butter, margarine, sour cream, or cream  cheese. Limit pre-made sauces, mixes, and "instant" products such as flavored rice, instant noodles, and ready-made pasta. Try more plant-protein sources, such as tofu, tempeh, black beans, edamame, lentils, nuts, and seeds. Explore eating plans such as the Mediterranean diet or vegetarian diet. Try heart-healthy dips made with beans and healthy fats like hummus and guacamole. Vegetables go great with these. Cooking Use oil to saut or stir-fry foods instead of solid fats such as butter, margarine, or lard. Try baking, boiling, grilling, or broiling instead of frying. Remove the fatty part of meats before cooking. Steam vegetables in water or broth. Meal planning  At meals, imagine dividing your plate into fourths: One-half of your plate is fruits and vegetables. One-fourth of your plate is whole grains. One-fourth of your plate is protein, especially lean meats, poultry, eggs, tofu, beans, or nuts. Include low-fat dairy as part of your daily diet. Lifestyle Choose healthy options in all settings, including home, work, school, restaurants, or stores. Prepare your food safely: Wash your hands after handling raw meats. Where you prepare food, keep surfaces clean by regularly washing with hot, soapy water. Keep raw meats separate from ready-to-eat foods, such as fruits and vegetables. Cook seafood, meat, poultry, and eggs to the recommended temperature. Get a food thermometer. Store foods at safe temperatures. In general: Keep cold foods at 93F (4.4C) or below. Keep hot foods at 193F (60C) or above. Keep your freezer at [facility] (-17.8C) or below. Foods are not safe to eat if they have been between the temperatures of 40-193F (4.4-60C) for more than 2 hours. What foods should I eat? Fruits Aim to eat 1-2 cups of fresh, canned (in natural  juice), or frozen fruits each day. One cup of fruit equals 1 small apple, 1 large banana, 8 large strawberries, 1 cup (237 g) canned fruit,  cup (82  g) dried fruit, or 1 cup (240 mL) 100% juice. Vegetables Aim to eat 2-4 cups of fresh and frozen vegetables each day, including different varieties and colors. One cup of vegetables equals 1 cup (91 g) broccoli or cauliflower florets, 2 medium carrots, 2 cups (150 g) raw, leafy greens, 1 large tomato, 1 large bell pepper, 1 large sweet potato, or 1 medium white potato. Grains Aim to eat 5-10 ounce-equivalents of whole grains each day. Examples of 1 ounce-equivalent of grains include 1 slice of bread, 1 cup (40 g) ready-to-eat cereal, 3 cups (24 g) popcorn, or  cup (93 g) cooked rice. Meats and other proteins Try to eat 5-7 ounce-equivalents of protein each day. Examples of 1 ounce-equivalent of protein include 1 egg,  oz nuts (12 almonds, 24 pistachios, or 7 walnut halves), 1/4 cup (90 g) cooked beans, 6 tablespoons (90 g) hummus or 1 tablespoon (16 g) peanut butter. A cut of meat or fish that is the size of a deck of cards is about 3-4 ounce-equivalents (85 g). Of the protein you eat each week, try to have at least 8 sounce (227 g) of seafood. This is about 2 servings per week. This includes salmon, trout, herring, sardines, and anchovies. Dairy Aim to eat 3 cup-equivalents of fat-free or low-fat dairy each day. Examples of 1 cup-equivalent of dairy include 1 cup (240 mL) milk, 8 ounces (250 g) yogurt, 1 ounces (44 g) natural cheese, or 1 cup (240 mL) fortified soy milk. Fats and oils Aim for about 5 teaspoons (21 g) of fats and oils per day. Choose monounsaturated fats, such as canola and olive oils, mayonnaise made with olive oil or avocado oil, avocados, peanut butter, and most nuts, or polyunsaturated fats, such as sunflower, corn, and soybean oils, walnuts, pine nuts, sesame seeds, sunflower seeds, and flaxseed. Beverages Aim for 6 eight-ounce glasses of water per day. Limit coffee to 3-5 eight-ounce cups per day. Limit caffeinated beverages that have added calories, such as soda and energy  drinks. If you drink alcohol: Limit how much you have to: 0-1 drink a day if you are female. 0-2 drinks a day if you are female. Know how much alcohol is in your drink. In the [place] one drink is one 12 oz bottle of beer (355 mL), one 5 oz glass of wine (148 mL), or one 1 oz glass of hard liquor (44 mL). Seasoning and other foods Try not to add too much salt to your food. Try using herbs and spices instead of salt. Try not to add sugar to food. This information is based on [place] nutrition guidelines. To learn more, visit [employer].be. Exact amounts may vary. You may need different amounts. This information is not intended to replace advice given to you by your health care provider. Make sure you discuss any questions you have with your health care provider. Document Revised: 06/19/2022 Document Reviewed: 06/19/2022 Elsevier Patient Education  2024 [employer].

## 2023-09-21 ENCOUNTER — Ambulatory Visit
Admission: RE | Admit: 2023-09-21 | Discharge: 2023-09-21 | Disposition: A | Discharge status: Home / Self Care | Payer: Medicare Other | Source: Ambulatory Visit | Attending: Internal Medicine | Admitting: Internal Medicine

## 2023-09-21 DIAGNOSIS — E042 Nontoxic multinodular goiter: Secondary | ICD-10-CM | POA: Diagnosis not present

## 2023-09-21 DIAGNOSIS — D0511 Intraductal carcinoma in situ of right breast: Secondary | ICD-10-CM | POA: Diagnosis not present

## 2023-09-25 ENCOUNTER — Ambulatory Visit: Payer: Medicare Other | Admitting: Internal Medicine

## 2023-09-27 ENCOUNTER — Ambulatory Visit: Payer: Medicare Other

## 2023-09-28 ENCOUNTER — Ambulatory Visit: Payer: Medicare Other

## 2023-10-01 ENCOUNTER — Ambulatory Visit: Payer: Medicare Other | Attending: Internal Medicine

## 2023-10-01 DIAGNOSIS — H6123 Impacted cerumen, bilateral: Secondary | ICD-10-CM

## 2023-10-01 NOTE — Progress Notes (Signed)
Patient  came for ear flush, unable to completely flush ear due to pain in right ear. I stopped and informed the patient about ENT referral and patient agreed. Referral has been placed

## 2023-10-08 ENCOUNTER — Telehealth: Payer: Self-pay | Admitting: Nurse Practitioner

## 2023-10-08 ENCOUNTER — Inpatient Hospital Stay: Payer: Self-pay | Attending: Hematology | Admitting: Nurse Practitioner

## 2023-10-08 NOTE — Telephone Encounter (Signed)
 Rescheduled appointment per patients request via incoming call. Patient is aware of the changes made to her upcoming appointment.

## 2023-10-08 NOTE — Progress Notes (Deleted)
 CLINIC:  Survivorship   REASON FOR VISIT:  Routine follow-up post-treatment for a recent history of breast cancer.  BRIEF ONCOLOGIC HISTORY:  Oncology History Overview Note   Cancer Staging  Ductal carcinoma in situ (DCIS) of right breast Staging form: Breast, AJCC 8th Edition - Clinical stage from 10/30/2022: Stage 0 (cTis (DCIS), cN0, cM0, G3, ER+, PR+, HER2: Not Assessed) - Signed by Lanny Callander, MD on 11/07/2022 Stage prefix: Initial diagnosis Histologic grading system: 3 grade system     Ductal carcinoma in situ (DCIS) of right breast  10/30/2022 Cancer Staging   Staging form: Breast, AJCC 8th Edition - Clinical stage from 10/30/2022: Stage 0 (cTis (DCIS), cN0, cM0, G3, ER+, PR+, HER2: Not Assessed) - Signed by Lanny Callander, MD on 11/07/2022 Stage prefix: Initial diagnosis Histologic grading system: 3 grade system   11/06/2022 Initial Diagnosis   Ductal carcinoma in situ (DCIS) of right breast   12/21/2022 Pathology Results    FINAL MICROSCOPIC DIAGNOSIS:  A. RIGHT BREAST, MASTECTOMY: High-grade ductal carcinoma in situ, solid type with necrosis and calcifications, two foci Negative for invasive carcinoma DCIS involves 5 of 14 submitted blocks (excluding the benign nipple) Margins free (DCIS 0.75 mm from the inferior margin) Changes consistent with prior biopsies (coil clip and X clip) Prognostic markers (from report [id number]): Estrogen receptor positive, progesterone receptor positive  B. RIGHT AXILLARY SENTINEL LYMPH NODE #1, EXCISION: One benign lymph node, negative for carcinoma (0/1)  C. RIGHT AXILLARY SENTINEL LYMPH NODE, EXCISION: One benign lymph node with lipogranulomata, negative for carcinoma (0/1)  D. RIGHT AXILLARY SENTINEL LYMPH NODE, EXCISION: One benign lymph node, negative for carcinoma (0/1)  E. RIGHT AXILLARY SENTINEL LYMPH NODE, EXCISION: One benign lymph node, negative for carcinoma (0/1)  F. RIGHT AXILLARY SENTINEL LYMPH NODE #2, EXCISION: One  benign lymph node with lipogranulomata, negative for carcinoma (0/1)  G. RIGHT AXILLARY SENTINEL LYMPH NODE, EXCISION: One benign lymph node with lipogranulomata, negative for carcinoma (0/1)  H. RIGHT AXILLARY SENTINEL LYMPH NODE, EXCISION: One benign lymph node, negative for carcinoma (0/1)  ONCOLOGY TABLE:  DCIS OF THE BREAST:  Resection Procedure: Lumpectomy Specimen Laterality: Right Histologic Type: Ductal carcinoma in situ Size of DCIS: DCIS involves 5 of 14 submitted blocks (excluding the nipple) Nuclear Grade: High-grade Necrosis: Present Margins: All margins negative for DCIS      Specify Closest Margin (required only if <12mm): DCIS 0.75 mm from the inferior margin Regional Lymph Nodes:      Number of Lymph Nodes Examined: 7      Number of Sentinel Nodes Examined (if applicable): 7      Number of Lymph Nodes with Macrometastases: 0      Number of Lymph Nodes with Micrometastases): 0      Number of Lymph Nodes with Isolated Tumor Cells (=0.2 mm or =200 cells): 0 Breast Biomarker Testing Performed on Previous Biopsy:      Testing performed on Case Number: [id number]      Estrogen Receptor: Positive (95%, strong staining)      Progesterone Receptor: Positive (30%, strong staining) Pathologic Stage Classification (pTNM, AJCC 8th Edition): pTis, pN0 Representative Tumor Block: A2, A7 Comment(s): (v4.4.0.0)    12/21/2022 Cancer Staging   Staging form: Breast, AJCC 8th Edition - Pathologic stage from 12/21/2022: Stage 0 (pTis (DCIS), pN0, cM0, G3, ER+, PR+, HER2: Not Assessed) - Signed by Lanny Callander, MD on 01/17/2023 Histologic grading system: 3 grade system Residual tumor (R): R0 - None     INTERVAL  HISTORY:  Janet Fowler presents to the Survivorship Clinic today for our initial meeting to review her survivorship care plan detailing her treatment course for breast cancer, as well as monitoring long-term side effects of that treatment, education regarding health  maintenance, screening, and overall wellness and health promotion.     Overall, Janet Fowler reports feeling quite well since completing her radiation therapy approximately 3 months ago.  She ***    REVIEW OF SYSTEMS:  Review of Systems - Oncology Breast: Denies any new nodularity, masses, tenderness, nipple changes, or nipple discharge.      ONCOLOGY TREATMENT TEAM:  1. Surgeon:  Dr. PIERRETTE at [facility] 2. Medical Oncologist: Dr. PIERRETTE  3. Radiation Oncologist: Dr. PIERRETTE    PAST MEDICAL/SURGICAL HISTORY:  Past Medical History:  Diagnosis Date  . Breast cancer (HCC)   . Hypertension    Past Surgical History:  Procedure Laterality Date  . BIOPSY THYROID  Bilateral 2024  . BREAST BIOPSY Right 10/30/2022   MM RT BREAST BX W LOC DEV 1ST LESION IMAGE BX SPEC STEREO GUIDE 10/30/2022 [facility]  . BREAST BIOPSY Right 10/30/2022   MM RT BREAST BX W LOC DEV EA AD LESION IMG BX SPEC STEREO GUIDE 10/30/2022 [facility]  . BREAST RECONSTRUCTION WITH PLACEMENT OF TISSUE EXPANDER AND FLEX HD (ACELLULAR HYDRATED DERMIS) Right 12/21/2022   Procedure: IMMEDIATE RIGHT BREAST RECONSTRUCTION WITH PLACEMENT OF TISSUE EXPANDER AND FLEX HD (ACELLULAR HYDRATED DERMIS);  Surgeon: Lowery Estefana RAMAN, DO;  Location: [facility] OR;  Service: Plastics;  Laterality: Right;  . BREAST REDUCTION SURGERY Left 04/26/2023   Procedure: MAMMARY REDUCTION LEFT BREAST;  Surgeon: Lowery Estefana RAMAN, DO;  Location: [facility];  Service: Plastics;  Laterality: Left;  . COLONOSCOPY  2022  . LIPOSUCTION Bilateral 04/26/2023   Procedure: LIPOSUCTION BILATERAL BREAST;  Surgeon: Lowery Estefana RAMAN, DO;  Location: [facility];  Service: Plastics;  Laterality: Bilateral;  . REMOVAL OF TISSUE EXPANDER AND PLACEMENT OF IMPLANT Right 04/26/2023   Procedure: REMOVAL OF RIGHT BREAST TISSUE EXPANDER AND PLACEMENT OF IMPLANT;  Surgeon: Lowery Estefana RAMAN, DO;  Location: [facility];  Service: Plastics;  Laterality: Right;  . SENTINEL NODE BIOPSY Right 12/21/2022   Procedure: SENTINEL NODE BIOPSY;  Surgeon: Curvin Deward MOULD, MD;  Location: [facility] OR;  Service: General;  Laterality: Right;  . TONSILLECTOMY    . TOTAL MASTECTOMY Right 12/21/2022   Procedure: RIGHT MASTECTOMY;  Surgeon: Curvin Deward MOULD, MD;  Location[facility] OR;  Service: General;  Laterality: Right;  GEN & PEC BLOCK     ALLERGIES:  Allergies  Allergen Reactions  . Steri-Strip Compound Benzoin [Benzoin] Other (See Comments)    Steri-Strips cause blisters on the skin     CURRENT MEDICATIONS:  Outpatient Encounter Medications as of 10/08/2023  Medication Sig  . amLODipine  (NORVASC ) 10 MG tablet Take 1 tablet (10 mg total) by mouth daily.  . anastrozole  (ARIMIDEX ) 1 MG tablet TAKE 1 TABLET(1 MG) BY MOUTH DAILY   No facility-administered encounter medications on file as of 10/08/2023.     ONCOLOGIC FAMILY HISTORY:  Family History  Problem Relation Age of Onset  . Hypertension Mother   . Arthritis Mother   . Stroke Father   . Hypertension Father   . Colon cancer Brother 66 - 79  . Prostate cancer Brother 49 - 79  . Prostate cancer Brother 72 - 16     GENETIC COUNSELING/TESTING: ***  SOCIAL HISTORY:  ALLYSE FREGEAU  is /single/married/divorced/widowed/separated and lives alone/with her spouse/family/friend in (city), [place] .  She has (#) children and they live in (city).  Janet Fowler is currently retired/disabled/working part-time/full-time as ***.  She denies any current or history of tobacco, alcohol, or illicit drug use.     PHYSICAL EXAMINATION:  Vital Signs:  There were no vitals filed for this visit. There were no vitals filed for this visit. General: Well-nourished, well-appearing female in no acute distress.  She is unaccompanied/accompanied in clinic by her ***** today.   HEENT: Head is normocephalic.  Pupils equal and reactive to light. Conjunctivae clear without exudate.   Sclerae anicteric. Oral mucosa is pink, moist.  Oropharynx is pink without lesions or erythema.  Lymph: No cervical, supraclavicular, or infraclavicular lymphadenopathy noted on palpation.  Cardiovascular: Regular rate and rhythm.SABRA Respiratory: Clear to auscultation bilaterally. Chest expansion symmetric; breathing non-labored.  GI: Abdomen soft and round; non-tender, non-distended. Bowel sounds normoactive.  GU: Deferred.  Neuro: No focal deficits. Steady gait.  Psych: Mood and affect normal and appropriate for situation.  Extremities: No edema. MSK: No focal spinal tenderness to palpation.  Full range of motion in bilateral upper extremities Skin: Warm and dry.  LABORATORY DATA:  None for this visit.  DIAGNOSTIC IMAGING:  None for this visit.      ASSESSMENT AND PLAN:  Ms.. Fowler is a pleasant 73 y.o. female with Stage *** right/left breast invasive ductal carcinoma, ER+/PR+/HER2-, diagnosed in (date), treated with lumpectomy, adjuvant radiation therapy, and anti-estrogen therapy with *** beginning in (date).  She presents to the Survivorship Clinic for our initial meeting and routine follow-up post-completion of treatment for breast cancer.    1. Stage *** right/left breast cancer:  Janet Fowler is continuing to recover from definitive treatment for breast cancer. She will follow-up with her medical oncologist, Dr. Reine in (month) /2017 with history and physical exam per surveillance protocol.  She will continue her anti-estrogen therapy with (drug). Thus far, she is tolerating the *** well, with minimal side effects. She was instructed to make Dr. Gudena or myself aware if she begins to experience any worsening side effects of the medication and I could see her back in clinic to help manage those side effects, as needed. Though the incidence is low, there is an associated risk of endometrial cancer with anti-estrogen therapies like Tamoxifen .  Janet Fowler was encouraged to  contact Dr. Dorethia or myself with any vaginal bleeding while taking Tamoxifen . Other side effects of Tamoxifen  were again reviewed with her as well. Today, a comprehensive survivorship care plan and treatment summary was reviewed with the patient today detailing her breast cancer diagnosis, treatment course, potential late/long-term effects of treatment, appropriate follow-up care with recommendations for the future, and patient education resources.  A copy of this summary, along with a letter will be sent to the patient's primary care provider via mail/fax/In Basket message after today's visit.    #. Problem(s) at Visit______________  #. Bone health:  Given Janet Fowler's age/history of breast cancer and her current treatment regimen including anti-estrogen therapy with _______, she is at risk for bone demineralization.  Her last DEXA scan was **/**/20**, which showed (results).***  In the meantime, she was encouraged to increase her consumption of foods rich in calcium, as well as increase her weight-bearing activities.  She was given education on specific activities to promote bone health.  #. Cancer screening:  Due to Janet Fowler's history and her age, she should receive screening for skin cancers, colon  cancer, and gynecologic cancers.  The information and recommendations are listed on the patient's comprehensive care plan/treatment summary and were reviewed in detail with the patient.    #. Health maintenance and wellness promotion: Janet Fowler was encouraged to consume 5-7 servings of fruits and vegetables per day. We reviewed the Nutrition Rainbow handout, as well as the handout Take Control of Your Health and Reduce Your Cancer Risk from the [employer].  She was also encouraged to engage in moderate to vigorous exercise for 30 minutes per day most days of the week. We discussed the [employer] fitness program, which is designed for cancer survivors to help them become more  physically fit after cancer treatments.  She was instructed to limit her alcohol consumption and continue to abstain from tobacco use/***was encouraged stop smoking.     #. Support services/counseling: It is not uncommon for this period of the patient's cancer care trajectory to be one of many emotions and stressors.  We discussed an opportunity for her to participate in the next session of FYNN (Finding Your New Normal) support group series designed for patients after they have completed treatment.   Janet Fowler was encouraged to take advantage of our many other support services programs, support groups, and/or counseling in coping with her new life as a cancer survivor after completing anti-cancer treatment.  She was offered support today through active listening and expressive supportive counseling.  She was given information regarding our available services and encouraged to contact me with any questions or for help enrolling in any of our support group/programs.    Dispo:   -Return to cancer center ***  -Mammogram due in *** -Follow up with surgery *** -She is welcome to return back to the Survivorship Clinic at any time; no additional follow-up needed at this time.  -Consider referral back to survivorship as a long-term survivor for continued surveillance  A total of (30) minutes of face-to-face time was spent with this patient with greater than 50% of that time in counseling and care-coordination.   Morna Dalton Kendall, NP Survivorship Program [facility] [phone]   Note: PRIMARY CARE PROVIDER Vicci Barnie NOVAK, MD [phone]

## 2023-10-17 DIAGNOSIS — H6123 Impacted cerumen, bilateral: Secondary | ICD-10-CM | POA: Diagnosis not present

## 2023-10-17 DIAGNOSIS — H7291 Unspecified perforation of tympanic membrane, right ear: Secondary | ICD-10-CM | POA: Diagnosis not present

## 2023-10-18 ENCOUNTER — Other Ambulatory Visit: Payer: Self-pay | Admitting: Hematology

## 2023-10-22 ENCOUNTER — Inpatient Hospital Stay: Payer: Medicare HMO | Attending: Hematology | Admitting: Nurse Practitioner

## 2023-10-22 ENCOUNTER — Encounter: Payer: Self-pay | Admitting: Nurse Practitioner

## 2023-10-22 VITALS — BP 154/82 | HR 71 | Temp 98.3°F | Resp 20 | Ht 62.0 in | Wt 209.8 lb

## 2023-10-22 DIAGNOSIS — Z9011 Acquired absence of right breast and nipple: Secondary | ICD-10-CM | POA: Insufficient documentation

## 2023-10-22 DIAGNOSIS — Z170 Estrogen receptor positive status [ER+]: Secondary | ICD-10-CM | POA: Insufficient documentation

## 2023-10-22 DIAGNOSIS — Z8042 Family history of malignant neoplasm of prostate: Secondary | ICD-10-CM | POA: Insufficient documentation

## 2023-10-22 DIAGNOSIS — D0511 Intraductal carcinoma in situ of right breast: Secondary | ICD-10-CM | POA: Insufficient documentation

## 2023-10-22 DIAGNOSIS — Z800 Family history of malignant neoplasm of digestive organs: Secondary | ICD-10-CM | POA: Insufficient documentation

## 2023-10-22 DIAGNOSIS — Z1721 Progesterone receptor positive status: Secondary | ICD-10-CM | POA: Diagnosis not present

## 2023-10-22 DIAGNOSIS — Z79811 Long term (current) use of aromatase inhibitors: Secondary | ICD-10-CM | POA: Insufficient documentation

## 2023-10-22 NOTE — Progress Notes (Signed)
CLINIC:  Survivorship   Patient Care Team: Marcine Matar, MD as PCP - General (Internal Medicine) Griselda Miner, MD as Consulting Physician (General Surgery) Malachy Mood, MD as Consulting Physician (Hematology) Dorothy Puffer, MD as Consulting Physician (Radiation Oncology) Pershing Proud, RN as Oncology Nurse Navigator Donnelly Angelica, RN as Oncology Nurse Navigator Pollyann Samples, NP as Nurse Practitioner (Nurse Practitioner)   REASON FOR VISIT:  Routine follow-up post-treatment for a recent history of breast cancer.  BRIEF ONCOLOGIC HISTORY:  Oncology History Overview Note   Cancer Staging  Ductal carcinoma in situ (DCIS) of right breast Staging form: Breast, AJCC 8th Edition - Clinical stage from 10/30/2022: Stage 0 (cTis (DCIS), cN0, cM0, G3, ER+, PR+, HER2: Not Assessed) - Signed by Malachy Mood, MD on 11/07/2022 Stage prefix: Initial diagnosis Histologic grading system: 3 grade system     Ductal carcinoma in situ (DCIS) of right breast  10/30/2022 Cancer Staging   Staging form: Breast, AJCC 8th Edition - Clinical stage from 10/30/2022: Stage 0 (cTis (DCIS), cN0, cM0, G3, ER+, PR+, HER2: Not Assessed) - Signed by Malachy Mood, MD on 11/07/2022 Stage prefix: Initial diagnosis Histologic grading system: 3 grade system   11/06/2022 Initial Diagnosis   Ductal carcinoma in situ (DCIS) of right breast   12/21/2022 Pathology Results    FINAL MICROSCOPIC DIAGNOSIS:  A. RIGHT BREAST, MASTECTOMY: High-grade ductal carcinoma in situ, solid type with necrosis and calcifications, two foci Negative for invasive carcinoma DCIS involves 5 of 14 submitted blocks (excluding the benign nipple) Margins free (DCIS 0.75 mm from the inferior margin) Changes consistent with prior biopsies (coil clip and X clip) Prognostic markers (from report [id number]): Estrogen receptor positive, progesterone receptor positive  B. RIGHT AXILLARY SENTINEL LYMPH NODE #1, EXCISION: One benign lymph node,  negative for carcinoma (0/1)  C. RIGHT AXILLARY SENTINEL LYMPH NODE, EXCISION: One benign lymph node with lipogranulomata, negative for carcinoma (0/1)  D. RIGHT AXILLARY SENTINEL LYMPH NODE, EXCISION: One benign lymph node, negative for carcinoma (0/1)  E. RIGHT AXILLARY SENTINEL LYMPH NODE, EXCISION: One benign lymph node, negative for carcinoma (0/1)  F. RIGHT AXILLARY SENTINEL LYMPH NODE #2, EXCISION: One benign lymph node with lipogranulomata, negative for carcinoma (0/1)  G. RIGHT AXILLARY SENTINEL LYMPH NODE, EXCISION: One benign lymph node with lipogranulomata, negative for carcinoma (0/1)  H. RIGHT AXILLARY SENTINEL LYMPH NODE, EXCISION: One benign lymph node, negative for carcinoma (0/1)  ONCOLOGY TABLE:  DCIS OF THE BREAST:  Resection Procedure: Lumpectomy Specimen Laterality: Right Histologic Type: Ductal carcinoma in situ Size of DCIS: DCIS involves 5 of 14 submitted blocks (excluding the nipple) Nuclear Grade: High-grade Necrosis: Present Margins: All margins negative for DCIS      Specify Closest Margin (required only if <18mm): DCIS 0.75 mm from the inferior margin Regional Lymph Nodes:      Number of Lymph Nodes Examined: 7      Number of Sentinel Nodes Examined (if applicable): 7      Number of Lymph Nodes with Macrometastases: 0      Number of Lymph Nodes with Micrometastases): 0      Number of Lymph Nodes with Isolated Tumor Cells (=0.2 mm or =200 cells): 0 Breast Biomarker Testing Performed on Previous Biopsy:      Testing performed on Case Number: [id number]      Estrogen Receptor: Positive (95%, strong staining)      Progesterone Receptor: Positive (30%, strong staining) Pathologic Stage Classification (pTNM, AJCC 8th Edition): pTis,  pN0 Representative Tumor Block: A2, A7 Comment(s): (v4.4.0.0)    12/21/2022 Cancer Staging   Staging form: Breast, AJCC 8th Edition - Pathologic stage from 12/21/2022: Stage 0 (pTis (DCIS), pN0, cM0, G3, ER+, PR+,  HER2: Not Assessed) - Signed by Malachy Mood, MD on 01/17/2023 Histologic grading system: 3 grade system Residual tumor (R): R0 - None     INTERVAL HISTORY:  Janet Fowler presents to the Survivorship Clinic today for our initial meeting to review her survivorship care plan detailing her treatment course for breast cancer, as well as monitoring long-term side effects of that treatment, education regarding health maintenance, screening, and overall wellness and health promotion.     Overall, Janet Fowler is doing well.  She developed severe leg muscle cramps on tamoxifen, required a cane, and stopped.  She is now tolerating anastrozole much better with no side effects. She wanted to have right breast revision but insurance denied it. Denies concerns in her breasts.     REVIEW OF SYSTEMS:  Review of Systems - Oncology Breast: Denies any new nodularity, masses, tenderness, nipple changes, or nipple discharge.      ONCOLOGY TREATMENT TEAM:  1. Surgeon:  Dr. Carolynne Edouard at [facility] 2. Medical Oncologist: Dr. Mosetta Putt 3. Radiation Oncologist: Dr. Mitzi Hansen (did not receive radiation)    PAST MEDICAL/SURGICAL HISTORY:  Past Medical History:  Diagnosis Date   Breast cancer (HCC)    Hypertension    Past Surgical History:  Procedure Laterality Date   BIOPSY THYROID Bilateral 2024   BREAST BIOPSY Right 10/30/2022   MM RT BREAST BX W LOC DEV 1ST LESION IMAGE BX SPEC STEREO GUIDE 10/30/2022 [facility]   BREAST BIOPSY Right 10/30/2022   MM RT BREAST BX W LOC DEV EA AD LESION IMG BX SPEC STEREO GUIDE 10/30/2022 [facility]   BREAST RECONSTRUCTION WITH PLACEMENT OF TISSUE EXPANDER AND FLEX HD (ACELLULAR HYDRATED DERMIS) Right 12/21/2022   Procedure: IMMEDIATE RIGHT BREAST RECONSTRUCTION WITH PLACEMENT OF TISSUE EXPANDER AND FLEX HD (ACELLULAR HYDRATED DERMIS);  Surgeon: Peggye Form, DO;  Location: [facility];  Service: Plastics;  Laterality: Right;   BREAST REDUCTION SURGERY Left  04/26/2023   Procedure: MAMMARY REDUCTION LEFT BREAST;  Surgeon: Peggye Form, DO;  Location: [facility];  Service: Plastics;  Laterality: Left;   COLONOSCOPY  2022   LIPOSUCTION Bilateral 04/26/2023   Procedure: LIPOSUCTION BILATERAL BREAST;  Surgeon: Peggye Form, DO;  Location: [facility];  Service: Plastics;  Laterality: Bilateral;   REMOVAL OF TISSUE EXPANDER AND PLACEMENT OF IMPLANT Right 04/26/2023   Procedure: REMOVAL OF RIGHT BREAST TISSUE EXPANDER AND PLACEMENT OF IMPLANT;  Surgeon: Peggye Form, DO;  Location: [facility];  Service: Plastics;  Laterality: Right;   SENTINEL NODE BIOPSY Right 12/21/2022   Procedure: SENTINEL NODE BIOPSY;  Surgeon: Griselda Miner, MD;  Location: [facility] OR;  Service: General;  Laterality: Right;   TONSILLECTOMY     TOTAL MASTECTOMY Right 12/21/2022   Procedure: RIGHT MASTECTOMY;  Surgeon: Griselda Miner, MD;  Location: [facility];  Service: General;  Laterality: Right;  GEN & PEC BLOCK     ALLERGIES:  Allergies  Allergen Reactions   Steri-Strip Compound Benzoin [Benzoin] Other (See Comments)    Steri-Strips cause blisters on the skin     CURRENT MEDICATIONS:  Outpatient Encounter Medications as of 10/22/2023  Medication Sig   amLODipine (NORVASC) 10 MG tablet Take 1 tablet (10 mg total) by mouth daily.  anastrozole (ARIMIDEX) 1 MG tablet TAKE 1 TABLET(1 MG) BY MOUTH DAILY   No facility-administered encounter medications on file as of 10/22/2023.     ONCOLOGIC FAMILY HISTORY:  Family History  Problem Relation Age of Onset   Hypertension Mother    Arthritis Mother    Stroke Father    Hypertension Father    Colon cancer Brother 67 - 32   Prostate cancer Brother 19 - 18   Prostate cancer Brother 1 - 34     GENETIC COUNSELING/TESTING: N/A  SOCIAL HISTORY:  MAKENLY MALLEK is currently retired from working as a Diplomatic Services operational officer at [employer]. She denies any current or history of  tobacco, alcohol, or illicit drug use.     PHYSICAL EXAMINATION:  Vital Signs:   Vitals:   10/22/23 1201  BP: (!) 154/82  Pulse: 71  Resp: 20  Temp: 98.3 F (36.8 C)  SpO2: 98%   Filed Weights   10/22/23 1201  Weight: 209 lb 12.8 oz (95.2 kg)   General: Well-nourished, well-appearing female in no acute distress.  HEENT: Sclerae anicteric.  Lymph: No cervical, supraclavicular, or infraclavicular lymphadenopathy noted on palpation.  Respiratory:  breathing non-labored.  GI: Abdomen soft and round; non-tender, non-distended. Bowel sounds normoactive.  Neuro: No focal deficits. Steady gait.  Psych: Mood and affect normal and appropriate for situation.  Extremities: No edema. MSK: No focal spinal tenderness to palpation.  Full range of motion in bilateral upper extremities Skin: Warm and dry. Breast exam: s/p R mastectomy and bilateral reconstruction. Incisions completely healed with mild scar tissue. No palpable mass in either breast or axilla that I could appreciate  LABORATORY DATA:  None for this visit.  DIAGNOSTIC IMAGING:  None for this visit.      ASSESSMENT AND PLAN:  Janet Fowler is a pleasant 73 y.o. 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  She presents to the Survivorship Clinic for our initial meeting and routine follow-up post-completion of treatment for breast cancer.    #. Stage 0 right breast cancer:  Janet Fowler has recovered well from definitive treatment for breast cancer. She will follow-up with her medical oncologist, Dr. Mosetta Putt in 01/2024 with history and physical exam per surveillance protocol.  She will continue her anti-estrogen therapy with Anastrozole. Thus far, she is tolerating well, with minimal side effects. She was instructed to make Dr. Mosetta Putt or myself aware if she begins to experience any worsening side effects of the medication and I could see her  back in clinic to help manage those side effects, as needed.Today, a comprehensive survivorship care plan and treatment summary was reviewed with the patient today detailing her breast cancer diagnosis, treatment course, potential late/long-term effects of treatment, appropriate follow-up care with recommendations for the future, and patient education resources.  A copy of this summary, along with a letter will be sent to the patient's primary care provider via mail/fax/In Basket message after today's visit.     #. Bone health:  Given Janet Fowler's age/history of breast cancer and her current treatment regimen including anti-estrogen therapy with Anastrozole, she is at risk for bone demineralization.  Will update her DEXA.  In the meantime, she was encouraged to take daily calcium and vit D and increase her weight-bearing activities.  She was given education on specific activities to promote bone health.  #. Cancer screening:  Due to Janet Fowler's history and her age, she should  receive screening for skin cancers and colon cancers.  The information and recommendations are listed on the patient's comprehensive care plan/treatment summary and were reviewed in detail with the patient.    #. Health maintenance and wellness promotion: Janet Fowler was encouraged to consume 5-7 servings of fruits and vegetables per day. She was also encouraged to engage in moderate to vigorous exercise for 30 minutes per day most days of the week.  She was instructed to limit her alcohol consumption and continue to abstain from tobacco use.   #. Support services/counseling: It is not uncommon for this period of the patient's cancer care trajectory to be one of many emotions and stressors.  We discussed an opportunity for her to participate in the next session of [facility] ("Finding Your New Normal") support group series designed for patients after they have completed treatment.   Janet Fowler was encouraged to take advantage of our many  other support services programs, support groups, and/or counseling in coping with her new life as a cancer survivor after completing anti-cancer treatment.  She was offered support today through active listening and expressive supportive counseling.  She was given information regarding our available services and encouraged to contact me with any questions or for help enrolling in any of our support group/programs.    Dispo:   -Return to cancer center 01/2024  -Mammogram scheduled tomorrow 10/23/23 -DEXA in the next few weeks -Continue Anastrozole -Follow up with surgery as indicated -She is welcome to return back to the Survivorship Clinic at any time; no additional follow-up needed at this time.  -Consider referral back to survivorship as a long-term survivor for continued surveillance  Orders Placed This Encounter  Procedures   DG Bone Density    Standing Status:   Future    Expected Date:   11/22/2023    Expiration Date:   10/21/2024    Reason for Exam (SYMPTOM  OR DIAGNOSIS REQUIRED):   baseline for anti-estrogen therapy    Preferred imaging location?:   [facility]     A total of (30) minutes of face-to-face time was spent with this patient with greater than 50% of that time in counseling and care-coordination.  Santiago Glad, NP [facility]orship [facility] [phone]   Note: PRIMARY CARE PROVIDER Marcine Matar, MD [phone]

## 2023-10-23 ENCOUNTER — Ambulatory Visit
Admission: RE | Admit: 2023-10-23 | Discharge: 2023-10-23 | Disposition: A | Discharge status: Home / Self Care | Payer: Medicare HMO | Source: Ambulatory Visit | Attending: Hematology | Admitting: Hematology

## 2023-10-23 DIAGNOSIS — Z1231 Encounter for screening mammogram for malignant neoplasm of breast: Secondary | ICD-10-CM

## 2023-10-24 NOTE — Progress Notes (Signed)
Negative appearing mammogram. Result letter to be mailed. Recall in 1 year.

## 2023-10-25 ENCOUNTER — Encounter: Payer: Self-pay | Admitting: Surgical

## 2023-10-25 ENCOUNTER — Ambulatory Visit: Payer: Medicare HMO | Admitting: Surgical

## 2023-10-25 VITALS — BP 142/83 | HR 74 | Ht 62.0 in | Wt 208.6 lb

## 2023-10-25 DIAGNOSIS — Z9011 Acquired absence of right breast and nipple: Secondary | ICD-10-CM

## 2023-10-25 DIAGNOSIS — D0511 Intraductal carcinoma in situ of right breast: Secondary | ICD-10-CM

## 2023-10-25 DIAGNOSIS — Z9889 Other specified postprocedural states: Secondary | ICD-10-CM

## 2023-10-25 MED ORDER — CEPHALEXIN 500 MG PO CAPS: 500.0000 mg | ORAL_CAPSULE | Freq: Four times a day (QID) | ORAL | 0 refills | Status: AC

## 2023-10-25 MED ORDER — TRAMADOL HCL 50 MG PO TABS: 50.0000 mg | ORAL_TABLET | Freq: Three times a day (TID) | ORAL | 0 refills | Status: DC | PRN

## 2023-10-25 MED ORDER — ONDANSETRON HCL 4 MG PO TABS: 4.0000 mg | ORAL_TABLET | Freq: Three times a day (TID) | ORAL | 0 refills | Status: DC | PRN

## 2023-10-25 NOTE — Progress Notes (Signed)
Patient ID: Janet Fowler, female    DOB: 11-12-1950, 73 y.o.   MRN: [id number]  Chief Complaint  Patient presents with   Pre-op Exam      ICD-10-CM   1. Ductal carcinoma in situ (DCIS) of right breast  D05.11     2. Acquired absence of right breast  Z90.11     3. S/P breast reconstruction  Z98.890       History of Present Illness: Janet Fowler is a 73 y.o.  female  with a history of right breast reconstruction.  She presents for preoperative evaluation for upcoming procedure, excision of right medial breast tissue with fat grafting for improved symmetry, liposuction of abdomen, scheduled for 11/14/2023 with Dr. Ulice Bold.  The patient has not had problems with anesthesia. No history of DVT/PE.  She does report her mother had a history of DVT at an elderly age, unknown cause.  No other family history of DVT. No family or personal history of bleeding or clotting disorders.  Patient is not currently taking any blood thinners.  No history of CVA/MI.   Summary of Previous Visit: Patient had right breast ductal carcinoma in situ, ER/PR positive.  She underwent right mastectomy March 2024, immediate reconstruction.  July 2024 she had an implant placed, 700 cc round ultrahigh profile.  She had a left breast reduction for symmetry.  Job: Retired.  PMH Significant for: History of right breast cancer, mastectomy with subsequent reconstruction and left breast reduction for symmetry.  History of hypertension.  She reports she has been feeling well lately with no recent changes to her health.  She reports has tolerated surgery in the past without any complications.  She does not have any cardiac or pulmonary disease.  She is allergic to Steri-Strips.   Past Medical History: Allergies: Allergies  Allergen Reactions   Steri-Strip Compound Benzoin [Benzoin] Other (See Comments)    Steri-Strips cause blisters on the skin    Current Medications:  Current Outpatient Medications:     amLODipine (NORVASC) 10 MG tablet, Take 1 tablet (10 mg total) by mouth daily., Disp: 90 tablet, Rfl: 1   anastrozole (ARIMIDEX) 1 MG tablet, TAKE 1 TABLET(1 MG) BY MOUTH DAILY, Disp: 30 tablet, Rfl: 1  Past Medical Problems: Past Medical History:  Diagnosis Date   Breast cancer (HCC)    Hypertension     Past Surgical History: Past Surgical History:  Procedure Laterality Date   BIOPSY THYROID Bilateral 2024   BREAST BIOPSY Right 10/30/2022   MM RT BREAST BX W LOC DEV 1ST LESION IMAGE BX SPEC STEREO GUIDE 10/30/2022 [facility]   BREAST BIOPSY Right 10/30/2022   MM RT BREAST BX W LOC DEV EA AD LESION IMG BX SPEC STEREO GUIDE 10/30/2022 [facility]   BREAST RECONSTRUCTION WITH PLACEMENT OF TISSUE EXPANDER AND FLEX HD (ACELLULAR HYDRATED DERMIS) Right 12/21/2022   Procedure: IMMEDIATE RIGHT BREAST RECONSTRUCTION WITH PLACEMENT OF TISSUE EXPANDER AND FLEX HD (ACELLULAR HYDRATED DERMIS);  Surgeon: Peggye Form, DO;  Location: [facility];  Service: Plastics;  Laterality: Right;   BREAST REDUCTION SURGERY Left 04/26/2023   Procedure: MAMMARY REDUCTION LEFT BREAST;  Surgeon: Peggye Form, DO;  Location: [facility];  Service: Plastics;  Laterality: Left;   COLONOSCOPY  2022   LIPOSUCTION Bilateral 04/26/2023   Procedure: LIPOSUCTION BILATERAL BREAST;  Surgeon: Peggye Form, DO;  Location: [facility];  Service: Plastics;  Laterality: Bilateral;   REMOVAL OF TISSUE EXPANDER AND  PLACEMENT OF IMPLANT Right 04/26/2023   Procedure: REMOVAL OF RIGHT BREAST TISSUE EXPANDER AND PLACEMENT OF IMPLANT;  Surgeon: Peggye Form, DO;  Location: [facility];  Service: Plastics;  Laterality: Right;   SENTINEL NODE BIOPSY Right 12/21/2022   Procedure: SENTINEL NODE BIOPSY;  Surgeon: Griselda Miner, MD;  Location: [facility];  Service: General;  Laterality: Right;   TONSILLECTOMY     TOTAL MASTECTOMY Right 12/21/2022   Procedure: RIGHT  MASTECTOMY;  Surgeon: Griselda Miner, MD;  Location: [facility];  Service: General;  Laterality: Right;  GEN & PEC BLOCK    Social History: Social History   Socioeconomic History   Marital status: Single    Spouse name: Not on file   Number of children: 0   Years of education: Not on file   Highest education level: Bachelor's degree (e.g., BA, AB, BS)  Occupational History   Occupation: Retired Diplomatic Services operational officer  Tobacco Use   Smoking status: Never   Smokeless tobacco: Never  Vaping Use   Vaping status: Never Used  Substance and Sexual Activity   Alcohol use: Never   Drug use: Never   Sexual activity: Not Currently    Birth control/protection: None  Other Topics Concern   Not on file  Social History Narrative   Not on file   Social Drivers of Health   Financial Resource Strain: Low Risk  (09/17/2023)   Overall Financial Resource Strain (CARDIA)    Difficulty of Paying Living Expenses: Not very hard  Recent Concern: Financial Resource Strain - Medium Risk (08/21/2023)   Overall Financial Resource Strain (CARDIA)    Difficulty of Paying Living Expenses: Somewhat hard  Food Insecurity: No Food Insecurity (09/17/2023)   Hunger Vital Sign    Worried About Running Out of Food in the Last Year: Never true    Ran Out of Food in the Last Year: Never true  Transportation Needs: No Transportation Needs (09/17/2023)   PRAPARE - Administrator, Civil Service (Medical): No    Lack of Transportation (Non-Medical): No  Physical Activity: Inactive (09/17/2023)   Exercise Vital Sign    Days of Exercise per Week: 0 days    Minutes of Exercise per Session: 10 min  Stress: No Stress Concern Present (09/17/2023)   [employer] of Occupational Health - Occupational Stress Questionnaire    Feeling of Stress : Not at all  Social Connections: Moderately Integrated (09/17/2023)   Social Connection and Isolation Panel [NHANES]    Frequency of Communication with Friends and Family: More  than three times a week    Frequency of Social Gatherings with Friends and Family: Twice a week    Attends Religious Services: More than 4 times per year    Active Member of [employer] or Organizations: Yes    Attends Banker Meetings: More than 4 times per year    Marital Status: Never married  Intimate Partner Violence: Not At Risk (12/21/2022)   Humiliation, Afraid, Rape, and Kick questionnaire    Fear of Current or Ex-Partner: No    Emotionally Abused: No    Physically Abused: No    Sexually Abused: No    Family History: Family History  Problem Relation Age of Onset   Hypertension Mother    Arthritis Mother    Stroke Father    Hypertension Father    Colon cancer Brother 41 - 49   Prostate cancer Brother 58 - 79   Prostate cancer Brother 53 -  79    Review of Systems: ROS  Physical Exam: Vital Signs BP (!) 142/83 (BP Location: Left Arm, Patient Position: Sitting, Cuff Size: Large)   Pulse 74   Ht 5\' 2"  (1.575 m)   Wt 208 lb 9.6 oz (94.6 kg)   SpO2 99%   BMI 38.15 kg/m   Physical Exam Constitutional:      General: Not in acute distress.    Appearance: Normal appearance. Not ill-appearing.  HENT:     Head: Normocephalic and atraumatic.  Eyes:     Pupils: Pupils are equal, round Neck:     Musculoskeletal: Normal range of motion.  Cardiovascular:     Rate and Rhythm: Normal rate    Pulses: Normal pulses.  Pulmonary:     Effort: Pulmonary effort is normal. No respiratory distress.  Abdominal:     General: Abdomen is flat. There is no distension.  Musculoskeletal: Normal range of motion.  Skin:    General: Skin is warm and dry.     Findings: No erythema or rash.  Neurological:     General: No focal deficit present.     Mental Status: Alert and oriented to person, place, and time. Mental status is at baseline.     Motor: No weakness.  Psychiatric:        Mood and Affect: Mood normal.        Behavior: Behavior normal.    Assessment/Plan: The  patient is scheduled for excision of right medial breast tissue and fat with fat grafting for improved symmetry, liposuction of abdomen with Dr. Ulice Bold.  Risks, benefits, and alternatives of procedure discussed, questions answered and consent obtained.    Smoking Status: Non-smoker; Counseling Given?  N/A  Caprini Score: 10; Risk Factors include: Age, history of breast cancer, mother with history of DVT, BMI > 25, and length of planned surgery. Encourage early ambulation.  Patient previously not anticoagulated with expander to implant exchange and left breast reduction for symmetry.  She tolerated this procedure well, she is encouraged to ambulate frequently after surgery, once per hour at a minute throughout the day.  Recommend mechanical prophylaxis.  Pictures obtained: @consult   Post-op Rx sent to pharmacy:  Tramadol, Zofran, Keflex  Patient was provided with the General Surgical Risk consent document and Pain Medication Agreement prior to their appointment.  They had adequate time to read through the risk consent documents and Pain Medication Agreement. We also discussed them in person together during this preop appointment. All of their questions were answered to their satisfaction.  Recommended calling if they have any further questions.  Risk consent form and Pain Medication Agreement to be scanned into patient's chart.  The risks that can be encountered with and after liposuction were discussed and include the following but no limited to these:  Asymmetry, fluid accumulation, firmness of the area, fat necrosis with death of fat tissue, bleeding, infection, delayed healing, anesthesia risks, skin sensation changes, injury to structures including nerves, blood vessels, and muscles which may be temporary or permanent, allergies to tape, suture materials and glues, blood products, topical preparations or injected agents, skin and contour irregularities, skin discoloration and swelling, deep vein  thrombosis, cardiac and pulmonary complications, pain, which may persist, persistent pain, recurrence of the lesion, poor healing of the incision, possible need for revisional surgery or staged procedures. Thiere can also be persistent swelling, poor wound healing, rippling or loose skin, worsening of cellulite, swelling, and thermal burn or heat injury from ultrasound with the ultrasound-assisted lipoplasty  technique. Any change in weight fluctuations can alter the outcome.   Electronically signed by: Kermit Balo Tyquavious Gamel, PA-C 10/25/2023 2:22 PM

## 2023-10-25 NOTE — H&P (View-Only) (Signed)
Patient ID: Janet Fowler, female    DOB: 09/19/1951, 73 y.o.   MRN: [id number]  Chief Complaint  Patient presents with   Pre-op Exam      ICD-10-CM   1. Ductal carcinoma in situ (DCIS) of right breast  D05.11     2. Acquired absence of right breast  Z90.11     3. S/P breast reconstruction  Z98.890       History of Present Illness: Janet Fowler is a 73 y.o.  female  with a history of right breast reconstruction.  She presents for preoperative evaluation for upcoming procedure, excision of right medial breast tissue with fat grafting for improved symmetry, liposuction of abdomen, scheduled for 11/14/2023 with Dr. Ulice Bold.  The patient has not had problems with anesthesia. No history of DVT/PE.  She does report her mother had a history of DVT at an elderly age, unknown cause.  No other family history of DVT. No family or personal history of bleeding or clotting disorders.  Patient is not currently taking any blood thinners.  No history of CVA/MI.   Summary of Previous Visit: Patient had right breast ductal carcinoma in situ, ER/PR positive.  She underwent right mastectomy March 2024, immediate reconstruction.  July 2024 she had an implant placed, 700 cc round ultrahigh profile.  She had a left breast reduction for symmetry.  Job: Retired.  PMH Significant for: History of right breast cancer, mastectomy with subsequent reconstruction and left breast reduction for symmetry.  History of hypertension.  She reports she has been feeling well lately with no recent changes to her health.  She reports has tolerated surgery in the past without any complications.  She does not have any cardiac or pulmonary disease.  She is allergic to Steri-Strips.   Past Medical History: Allergies: Allergies  Allergen Reactions   Steri-Strip Compound Benzoin [Benzoin] Other (See Comments)    Steri-Strips cause blisters on the skin    Current Medications:  Current Outpatient Medications:     amLODipine (NORVASC) 10 MG tablet, Take 1 tablet (10 mg total) by mouth daily., Disp: 90 tablet, Rfl: 1   anastrozole (ARIMIDEX) 1 MG tablet, TAKE 1 TABLET(1 MG) BY MOUTH DAILY, Disp: 30 tablet, Rfl: 1  Past Medical Problems: Past Medical History:  Diagnosis Date   Breast cancer (HCC)    Hypertension     Past Surgical History: Past Surgical History:  Procedure Laterality Date   BIOPSY THYROID Bilateral 2024   BREAST BIOPSY Right 10/30/2022   MM RT BREAST BX W LOC DEV 1ST LESION IMAGE BX SPEC STEREO GUIDE 10/30/2022 [facility]   BREAST BIOPSY Right 10/30/2022   MM RT BREAST BX W LOC DEV EA AD LESION IMG BX SPEC STEREO GUIDE 10/30/2022 [facility]   BREAST RECONSTRUCTION WITH PLACEMENT OF TISSUE EXPANDER AND FLEX HD (ACELLULAR HYDRATED DERMIS) Right 12/21/2022   Procedure: IMMEDIATE RIGHT BREAST RECONSTRUCTION WITH PLACEMENT OF TISSUE EXPANDER AND FLEX HD (ACELLULAR HYDRATED DERMIS);  Surgeon: Peggye Form, DO;  Location: [facility] OR;  Service: Plastics;  Laterality: Right;   BREAST REDUCTION SURGERY Left 04/26/2023   Procedure: MAMMARY REDUCTION LEFT BREAST;  Surgeon: Peggye Form, DO;  Location: [facility];  Service: Plastics;  Laterality: Left;   COLONOSCOPY  2022   LIPOSUCTION Bilateral 04/26/2023   Procedure: LIPOSUCTION BILATERAL BREAST;  Surgeon: Peggye Form, DO;  Location: [facility];  Service: Plastics;  Laterality: Bilateral;   REMOVAL OF TISSUE EXPANDER AND  PLACEMENT OF IMPLANT Right 04/26/2023   Procedure: REMOVAL OF RIGHT BREAST TISSUE EXPANDER AND PLACEMENT OF IMPLANT;  Surgeon: Peggye Form, DO;  Location: [facility];  Service: Plastics;  Laterality: Right;   SENTINEL NODE BIOPSY Right 12/21/2022   Procedure: SENTINEL NODE BIOPSY;  Surgeon: Griselda Miner, MD;  Location: [facility] OR;  Service: General;  Laterality: Right;   TONSILLECTOMY     TOTAL MASTECTOMY Right 12/21/2022   Procedure: RIGHT  MASTECTOMY;  Surgeon: Griselda Miner, MD;  Location: [facility] OR;  Service: General;  Laterality: Right;  GEN & PEC BLOCK    Social History: Social History   Socioeconomic History   Marital status: Single    Spouse name: Not on file   Number of children: 0   Years of education: Not on file   Highest education level: Bachelor's degree (e.g., BA, AB, BS)  Occupational History   Occupation: Retired Diplomatic Services operational officer  Tobacco Use   Smoking status: Never   Smokeless tobacco: Never  Vaping Use   Vaping status: Never Used  Substance and Sexual Activity   Alcohol use: Never   Drug use: Never   Sexual activity: Not Currently    Birth control/protection: None  Other Topics Concern   Not on file  Social History Narrative   Not on file   Social Drivers of Health   Financial Resource Strain: Low Risk  (09/17/2023)   Overall Financial Resource Strain (CARDIA)    Difficulty of Paying Living Expenses: Not very hard  Recent Concern: Financial Resource Strain - Medium Risk (08/21/2023)   Overall Financial Resource Strain (CARDIA)    Difficulty of Paying Living Expenses: Somewhat hard  Food Insecurity: No Food Insecurity (09/17/2023)   Hunger Vital Sign    Worried About Running Out of Food in the Last Year: Never true    Ran Out of Food in the Last Year: Never true  Transportation Needs: No Transportation Needs (09/17/2023)   PRAPARE - Administrator, Civil Service (Medical): No    Lack of Transportation (Non-Medical): No  Physical Activity: Inactive (09/17/2023)   Exercise Vital Sign    Days of Exercise per Week: 0 days    Minutes of Exercise per Session: 10 min  Stress: No Stress Concern Present (09/17/2023)   [employer] of Occupational Health - Occupational Stress Questionnaire    Feeling of Stress : Not at all  Social Connections: Moderately Integrated (09/17/2023)   Social Connection and Isolation Panel [NHANES]    Frequency of Communication with Friends and Family: More  than three times a week    Frequency of Social Gatherings with Friends and Family: Twice a week    Attends Religious Services: More than 4 times per year    Active Member of [employer] or Organizations: Yes    Attends Banker Meetings: More than 4 times per year    Marital Status: Never married  Intimate Partner Violence: Not At Risk (12/21/2022)   Humiliation, Afraid, Rape, and Kick questionnaire    Fear of Current or Ex-Partner: No    Emotionally Abused: No    Physically Abused: No    Sexually Abused: No    Family History: Family History  Problem Relation Age of Onset   Hypertension Mother    Arthritis Mother    Stroke Father    Hypertension Father    Colon cancer Brother 15 - 2   Prostate cancer Brother 46 - 79   Prostate cancer Brother 106 -  79    Review of Systems: ROS  Physical Exam: Vital Signs BP (!) 142/83 (BP Location: Left Arm, Patient Position: Sitting, Cuff Size: Large)   Pulse 74   Ht 5\' 2"  (1.575 m)   Wt 208 lb 9.6 oz (94.6 kg)   SpO2 99%   BMI 38.15 kg/m   Physical Exam Constitutional:      General: Not in acute distress.    Appearance: Normal appearance. Not ill-appearing.  HENT:     Head: Normocephalic and atraumatic.  Eyes:     Pupils: Pupils are equal, round Neck:     Musculoskeletal: Normal range of motion.  Cardiovascular:     Rate and Rhythm: Normal rate    Pulses: Normal pulses.  Pulmonary:     Effort: Pulmonary effort is normal. No respiratory distress.  Abdominal:     General: Abdomen is flat. There is no distension.  Musculoskeletal: Normal range of motion.  Skin:    General: Skin is warm and dry.     Findings: No erythema or rash.  Neurological:     General: No focal deficit present.     Mental Status: Alert and oriented to person, place, and time. Mental status is at baseline.     Motor: No weakness.  Psychiatric:        Mood and Affect: Mood normal.        Behavior: Behavior normal.    Assessment/Plan: The  patient is scheduled for excision of right medial breast tissue and fat with fat grafting for improved symmetry, liposuction of abdomen with Dr. Ulice Bold.  Risks, benefits, and alternatives of procedure discussed, questions answered and consent obtained.    Smoking Status: Non-smoker; Counseling Given?  N/A  Caprini Score: 10; Risk Factors include: Age, history of breast cancer, mother with history of DVT, BMI > 25, and length of planned surgery. Encourage early ambulation.  Patient previously not anticoagulated with expander to implant exchange and left breast reduction for symmetry.  She tolerated this procedure well, she is encouraged to ambulate frequently after surgery, once per hour at a minute throughout the day.  Recommend mechanical prophylaxis.  Pictures obtained: @consult   Post-op Rx sent to pharmacy:  Tramadol, Zofran, Keflex  Patient was provided with the General Surgical Risk consent document and Pain Medication Agreement prior to their appointment.  They had adequate time to read through the risk consent documents and Pain Medication Agreement. We also discussed them in person together during this preop appointment. All of their questions were answered to their satisfaction.  Recommended calling if they have any further questions.  Risk consent form and Pain Medication Agreement to be scanned into patient's chart.  The risks that can be encountered with and after liposuction were discussed and include the following but no limited to these:  Asymmetry, fluid accumulation, firmness of the area, fat necrosis with death of fat tissue, bleeding, infection, delayed healing, anesthesia risks, skin sensation changes, injury to structures including nerves, blood vessels, and muscles which may be temporary or permanent, allergies to tape, suture materials and glues, blood products, topical preparations or injected agents, skin and contour irregularities, skin discoloration and swelling, deep vein  thrombosis, cardiac and pulmonary complications, pain, which may persist, persistent pain, recurrence of the lesion, poor healing of the incision, possible need for revisional surgery or staged procedures. Thiere can also be persistent swelling, poor wound healing, rippling or loose skin, worsening of cellulite, swelling, and thermal burn or heat injury from ultrasound with the ultrasound-assisted lipoplasty  technique. Any change in weight fluctuations can alter the outcome.   Electronically signed by: Kermit Balo Mayanna Garlitz, PA-C 10/25/2023 2:22 PM

## 2023-10-30 ENCOUNTER — Ambulatory Visit: Payer: Medicare Other | Admitting: Internal Medicine

## 2023-11-07 ENCOUNTER — Other Ambulatory Visit: Payer: Self-pay

## 2023-11-07 ENCOUNTER — Encounter (HOSPITAL_BASED_OUTPATIENT_CLINIC_OR_DEPARTMENT_OTHER): Payer: Self-pay | Admitting: Plastic Surgery

## 2023-11-13 ENCOUNTER — Other Ambulatory Visit: Payer: Self-pay

## 2023-11-13 ENCOUNTER — Encounter (HOSPITAL_COMMUNITY): Payer: Self-pay | Admitting: Plastic Surgery

## 2023-11-13 NOTE — Pre-Procedure Instructions (Signed)
-------------    SDW INSTRUCTIONS given:  Your procedure is scheduled on 2/12.  Report to [facility] Main Entrance "A" at 08:00 A.M., and check in at the Admitting office.  Any questions or running late day of surgery: call [phone]    Remember:  Do not eat or drink after midnight the night before your surgery     Take these medicines the morning of surgery with A SIP OF WATER  Amlodipine Arimidex Zofran PRN Tramadol PRN    As of today, STOP taking any Aspirin (unless otherwise instructed by your surgeon) Aleve, Naproxen, Ibuprofen, Motrin, Advil, Goody's, BC's, all herbal medications, fish oil, and all vitamins.   Do NOT Smoke (Tobacco/Vaping) 24 hours prior to your procedure  If you use a CPAP at night, you may bring all equipment for your overnight stay.     You will be asked to remove any contacts, glasses, piercing's, hearing aid's, dentures/partials prior to surgery. Please bring cases for these items if needed.     Patients discharged the day of surgery will not be allowed to drive home, and someone needs to stay with them for 24 hours.  SURGICAL WAITING ROOM VISITATION Patients may have no more than 2 support people in the waiting area - these visitors may rotate.   Pre-op nurse will coordinate an appropriate time for 1 ADULT support person, who may not rotate, to accompany patient in pre-op.  Children under the age of 91 must have an adult with them who is not the patient and must remain in the main waiting area with an adult.  If the patient needs to stay at the hospital during part of their recovery, the visitor guidelines for inpatient rooms apply.  Please refer to the [facility] website for the visitor guidelines for any additional information.   Special instructions:   Janet Fowler- Preparing For Surgery   Please follow these instructions carefully.   Shower the NIGHT BEFORE SURGERY and the MORNING OF SURGERY with DIAL Soap.   Pat yourself dry with a  CLEAN TOWEL.  Wear CLEAN PAJAMAS to bed the night before surgery  Place CLEAN SHEETS on your bed the night of your first shower and DO NOT SLEEP WITH PETS.   Additional instructions for the day of surgery: DO NOT APPLY any lotions, deodorants, cologne, or perfumes.   Do not wear jewelry or makeup Do not wear nail polish, gel polish, artificial nails, or any other type of covering on natural nails (fingers and toes) Do not bring valuables to the hospital. [facility] is not responsible for valuables/personal belongings. Put on clean/comfortable clothes.  Please brush your teeth.  Ask your nurse before applying any prescription medications to the skin.

## 2023-11-13 NOTE — Progress Notes (Signed)
PCP - Dr. Jonah Blue Cardiologist - denies  PPM/ICD - denies   Chest x-ray - pt unsure, but doesn't think she has had one EKG - 04/19/23 Stress Test - denies ECHO - denies Cardiac Cath - denies  CPAP - denies  DM- denies  ASA/Blood Thinner Instructions: n/a   ERAS Protcol - no, NPO  COVID TEST- n/a  Anesthesia review: no  Patient verbally denies any shortness of breath, fever, cough and chest pain during phone call       Questions were answered. Patient verbalized understanding of instructions.

## 2023-11-14 ENCOUNTER — Ambulatory Visit (HOSPITAL_COMMUNITY): Payer: Self-pay | Admitting: Anesthesiology

## 2023-11-14 ENCOUNTER — Encounter (HOSPITAL_COMMUNITY): Payer: Self-pay | Admitting: Plastic Surgery

## 2023-11-14 ENCOUNTER — Encounter (HOSPITAL_COMMUNITY)
Admission: RE | Disposition: A | Discharge status: Home / Self Care | Payer: Self-pay | Source: Home / Self Care | Attending: Plastic Surgery

## 2023-11-14 ENCOUNTER — Ambulatory Visit (HOSPITAL_COMMUNITY)
Admission: RE | Admit: 2023-11-14 | Discharge: 2023-11-14 | Disposition: A | Discharge status: Home / Self Care | Payer: Medicare HMO | Attending: Plastic Surgery | Admitting: Plastic Surgery

## 2023-11-14 ENCOUNTER — Ambulatory Visit (HOSPITAL_BASED_OUTPATIENT_CLINIC_OR_DEPARTMENT_OTHER): Payer: Self-pay | Admitting: Anesthesiology

## 2023-11-14 ENCOUNTER — Other Ambulatory Visit: Payer: Self-pay

## 2023-11-14 DIAGNOSIS — Z853 Personal history of malignant neoplasm of breast: Secondary | ICD-10-CM

## 2023-11-14 DIAGNOSIS — Z79811 Long term (current) use of aromatase inhibitors: Secondary | ICD-10-CM | POA: Diagnosis not present

## 2023-11-14 DIAGNOSIS — N6489 Other specified disorders of breast: Secondary | ICD-10-CM | POA: Insufficient documentation

## 2023-11-14 DIAGNOSIS — Z5986 Financial insecurity: Secondary | ICD-10-CM | POA: Insufficient documentation

## 2023-11-14 DIAGNOSIS — I10 Essential (primary) hypertension: Secondary | ICD-10-CM | POA: Diagnosis not present

## 2023-11-14 DIAGNOSIS — N651 Disproportion of reconstructed breast: Secondary | ICD-10-CM

## 2023-11-14 DIAGNOSIS — Z9011 Acquired absence of right breast and nipple: Secondary | ICD-10-CM | POA: Insufficient documentation

## 2023-11-14 DIAGNOSIS — Z79899 Other long term (current) drug therapy: Secondary | ICD-10-CM | POA: Diagnosis not present

## 2023-11-14 DIAGNOSIS — Z01818 Encounter for other preprocedural examination: Secondary | ICD-10-CM

## 2023-11-14 HISTORY — PX: LIPOSUCTION WITH LIPOFILLING: SHX6436

## 2023-11-14 LAB — CBC
HCT: 43.6 % (ref 36.0–46.0)
Hemoglobin: 14.6 g/dL (ref 12.0–15.0)
MCH: 29.8 pg (ref 26.0–34.0)
MCHC: 33.5 g/dL (ref 30.0–36.0)
MCV: 89 fL (ref 80.0–100.0)
Platelets: 247 10*3/uL (ref 150–400)
RBC: 4.9 MIL/uL (ref 3.87–5.11)
RDW: 12.2 % (ref 11.5–15.5)
WBC: 4.3 10*3/uL (ref 4.0–10.5)
nRBC: 0 % (ref 0.0–0.2)

## 2023-11-14 LAB — BASIC METABOLIC PANEL
Anion gap: 15 (ref 5–15)
BUN: 9 mg/dL (ref 8–23)
CO2: 21 mmol/L — ABNORMAL LOW (ref 22–32)
Calcium: 10 mg/dL (ref 8.9–10.3)
Chloride: 104 mmol/L (ref 98–111)
Creatinine, Ser: 0.86 mg/dL (ref 0.44–1.00)
GFR, Estimated: >60 mL/min (ref >60)
Glucose, Bld: 91 mg/dL (ref 70–99)
Potassium: 4 mmol/L (ref 3.5–5.1)
Sodium: 140 mmol/L (ref 135–145)

## 2023-11-14 SURGERY — REVISION, RECONSTRUCTION, BREAST
Anesthesia: General | Site: Breast | Laterality: Right

## 2023-11-14 MED ORDER — ACETAMINOPHEN 500 MG PO TABS
ORAL_TABLET | ORAL | Status: DC
Start: 2023-11-14 — End: 2023-11-14
  Filled 2023-11-14: qty 2

## 2023-11-14 MED ORDER — LACTATED RINGERS IV SOLN
INTRAVENOUS | Status: DC
Start: 2023-11-14 — End: 2023-11-14

## 2023-11-14 MED ORDER — DEXAMETHASONE SODIUM PHOSPHATE 10 MG/ML IJ SOLN
INTRAMUSCULAR | Status: DC | PRN
  Administered 2023-11-14: 5 mg via INTRAVENOUS

## 2023-11-14 MED ORDER — ROCURONIUM BROMIDE 10 MG/ML (PF) SYRINGE
PREFILLED_SYRINGE | INTRAVENOUS | Status: AC
  Filled 2023-11-14: qty 10

## 2023-11-14 MED ORDER — ACETAMINOPHEN 650 MG RE SUPP: 650.0000 mg | RECTAL | Status: DC | PRN

## 2023-11-14 MED ORDER — ORAL CARE MOUTH RINSE: 15.0000 mL | Freq: Once | OROMUCOSAL | Status: AC

## 2023-11-14 MED ORDER — FENTANYL CITRATE (PF) 250 MCG/5ML IJ SOLN
INTRAMUSCULAR | Status: AC
  Filled 2023-11-14: qty 5

## 2023-11-14 MED ORDER — ACETAMINOPHEN 325 MG PO TABS: 650.0000 mg | ORAL_TABLET | ORAL | Status: DC | PRN

## 2023-11-14 MED ORDER — DEXAMETHASONE SODIUM PHOSPHATE 10 MG/ML IJ SOLN
INTRAMUSCULAR | Status: AC
  Filled 2023-11-14: qty 1

## 2023-11-14 MED ORDER — LIDOCAINE-EPINEPHRINE 1 %-1:100000 IJ SOLN
INTRAMUSCULAR | Status: DC | PRN
  Administered 2023-11-14: 10 mL

## 2023-11-14 MED ORDER — SODIUM CHLORIDE 0.9% FLUSH: 3.0000 mL | INTRAVENOUS | Status: DC | PRN

## 2023-11-14 MED ORDER — FENTANYL CITRATE (PF) 100 MCG/2ML IJ SOLN: 25.0000 ug | INTRAMUSCULAR | Status: DC | PRN

## 2023-11-14 MED ORDER — SODIUM CHLORIDE 0.9% FLUSH: 3.0000 mL | Freq: Two times a day (BID) | INTRAVENOUS | Status: DC

## 2023-11-14 MED ORDER — PHENYLEPHRINE 80 MCG/ML (10ML) SYRINGE FOR IV PUSH (FOR BLOOD PRESSURE SUPPORT)
PREFILLED_SYRINGE | INTRAVENOUS | Status: DC | PRN
  Administered 2023-11-14: 80 ug via INTRAVENOUS

## 2023-11-14 MED ORDER — ONDANSETRON HCL 4 MG/2ML IJ SOLN
INTRAMUSCULAR | Status: DC | PRN
  Administered 2023-11-14: 4 mg via INTRAVENOUS

## 2023-11-14 MED ORDER — ACETAMINOPHEN 500 MG PO TABS
1000.0000 mg | ORAL_TABLET | Freq: Once | ORAL | Status: AC
  Administered 2023-11-14: 1000 mg via ORAL

## 2023-11-14 MED ORDER — BUPIVACAINE-EPINEPHRINE (PF) 0.25% -1:200000 IJ SOLN
INTRAMUSCULAR | Status: AC
  Filled 2023-11-14: qty 30

## 2023-11-14 MED ORDER — LACTATED RINGERS IR SOLN
Status: DC | PRN
  Administered 2023-11-14: 2000 mL

## 2023-11-14 MED ORDER — MIDAZOLAM HCL 2 MG/2ML IJ SOLN
INTRAMUSCULAR | Status: AC
  Filled 2023-11-14: qty 2

## 2023-11-14 MED ORDER — CHLORHEXIDINE GLUCONATE CLOTH 2 % EX PADS
6.0000 | MEDICATED_PAD | Freq: Once | CUTANEOUS | Status: DC
Start: 2023-11-14 — End: 2023-11-14

## 2023-11-14 MED ORDER — BUPIVACAINE LIPOSOME 1.3 % IJ SUSP
INTRAMUSCULAR | Status: AC
  Filled 2023-11-14: qty 20

## 2023-11-14 MED ORDER — PROPOFOL 10 MG/ML IV BOLUS
INTRAVENOUS | Status: DC | PRN
  Administered 2023-11-14: 50 mg via INTRAVENOUS
  Administered 2023-11-14: 120 mg via INTRAVENOUS
  Administered 2023-11-14: 30 mg via INTRAVENOUS

## 2023-11-14 MED ORDER — PROPOFOL 10 MG/ML IV BOLUS
INTRAVENOUS | Status: AC
  Filled 2023-11-14: qty 20

## 2023-11-14 MED ORDER — LIDOCAINE 2% (20 MG/ML) 5 ML SYRINGE
INTRAMUSCULAR | Status: AC
  Filled 2023-11-14: qty 5

## 2023-11-14 MED ORDER — ONDANSETRON HCL 4 MG/2ML IJ SOLN
INTRAMUSCULAR | Status: AC
Start: 2023-11-14 — End: ?
  Filled 2023-11-14: qty 2

## 2023-11-14 MED ORDER — LACTATED RINGERS IV SOLN
Freq: Once | INTRAVENOUS | Status: AC
  Administered 2023-11-14: 500 mL
  Filled 2023-11-14: qty 30

## 2023-11-14 MED ORDER — CEFAZOLIN SODIUM-DEXTROSE 2-4 GM/100ML-% IV SOLN
2.0000 g | INTRAVENOUS | Status: AC
  Administered 2023-11-14: 2 g via INTRAVENOUS
  Filled 2023-11-14: qty 100

## 2023-11-14 MED ORDER — CHLORHEXIDINE GLUCONATE 0.12 % MT SOLN
15.0000 mL | Freq: Once | OROMUCOSAL | Status: AC
  Administered 2023-11-14: 15 mL via OROMUCOSAL
  Filled 2023-11-14: qty 15

## 2023-11-14 MED ORDER — OXYCODONE HCL 5 MG PO TABS: 5.0000 mg | ORAL_TABLET | ORAL | Status: DC | PRN

## 2023-11-14 MED ORDER — BUPIVACAINE HCL (PF) 0.25 % IJ SOLN
INTRAMUSCULAR | Status: AC
Start: 2023-11-14 — End: ?
  Filled 2023-11-14: qty 30

## 2023-11-14 MED ORDER — ROCURONIUM BROMIDE 10 MG/ML (PF) SYRINGE
PREFILLED_SYRINGE | INTRAVENOUS | Status: DC | PRN
  Administered 2023-11-14: 40 mg via INTRAVENOUS
  Administered 2023-11-14: 10 mg via INTRAVENOUS

## 2023-11-14 MED ORDER — LACTATED RINGERS IV SOLN: INTRAVENOUS | Status: DC

## 2023-11-14 MED ORDER — FENTANYL CITRATE (PF) 250 MCG/5ML IJ SOLN
INTRAMUSCULAR | Status: DC | PRN
Start: 2023-11-14 — End: 2023-11-14
  Administered 2023-11-14: 25 ug via INTRAVENOUS
  Administered 2023-11-14 (×3): 50 ug via INTRAVENOUS
  Administered 2023-11-14: 25 ug via INTRAVENOUS
  Administered 2023-11-14: 50 ug via INTRAVENOUS

## 2023-11-14 MED ORDER — SODIUM CHLORIDE (PF) 0.9 % IJ SOLN
INTRAMUSCULAR | Status: AC
  Filled 2023-11-14: qty 20

## 2023-11-14 MED ORDER — 0.9 % SODIUM CHLORIDE (POUR BTL) OPTIME
TOPICAL | Status: DC | PRN
  Administered 2023-11-14: 1000 mL

## 2023-11-14 MED ORDER — LIDOCAINE 2% (20 MG/ML) 5 ML SYRINGE
INTRAMUSCULAR | Status: DC | PRN
  Administered 2023-11-14: 80 mg via INTRAVENOUS

## 2023-11-14 MED ORDER — SODIUM CHLORIDE 0.9 % IV SOLN: 250.0000 mL | INTRAVENOUS | Status: DC | PRN

## 2023-11-14 MED ORDER — SUGAMMADEX SODIUM 200 MG/2ML IV SOLN
INTRAVENOUS | Status: DC | PRN
  Administered 2023-11-14: 200 mg via INTRAVENOUS

## 2023-11-14 MED ORDER — LIDOCAINE-EPINEPHRINE 1 %-1:100000 IJ SOLN
INTRAMUSCULAR | Status: AC
Start: 2023-11-14 — End: ?
  Filled 2023-11-14: qty 1

## 2023-11-14 SURGICAL SUPPLY — 77 items
BAG COUNTER SPONGE SURGICOUNT (BAG) ×1 IMPLANT
BAG DECANTER FOR FLEXI CONT (MISCELLANEOUS) ×1 IMPLANT
BINDER ABDOMINAL 12 ML 46-62 (SOFTGOODS) IMPLANT
BINDER BREAST LRG (GAUZE/BANDAGES/DRESSINGS) IMPLANT
BINDER BREAST MEDIUM (GAUZE/BANDAGES/DRESSINGS) IMPLANT
BINDER BREAST XLRG (GAUZE/BANDAGES/DRESSINGS) IMPLANT
BINDER BREAST XXLRG (GAUZE/BANDAGES/DRESSINGS) IMPLANT
BIOPATCH RED 1 DISK 7.0 (GAUZE/BANDAGES/DRESSINGS) IMPLANT
BLADE SURG 10 STRL SS (BLADE) ×1 IMPLANT
BNDG GAUZE DERMACEA FLUFF 4 (GAUZE/BANDAGES/DRESSINGS) IMPLANT
CANISTER SUCT 3000ML PPV (MISCELLANEOUS) ×1 IMPLANT
CHLORAPREP W/TINT 26 (MISCELLANEOUS) ×1 IMPLANT
CNTNR URN SCR LID CUP LEK RST (MISCELLANEOUS) IMPLANT
COVER SURGICAL LIGHT HANDLE (MISCELLANEOUS) ×1 IMPLANT
DERMABOND ADVANCED .7 DNX12 (GAUZE/BANDAGES/DRESSINGS) ×3 IMPLANT
DRAIN CHANNEL 19F RND (DRAIN) IMPLANT
DRAPE CHEST BREAST 15X10 FENES (DRAPES) ×1 IMPLANT
DRAPE SURG ORHT 6 SPLT 77X108 (DRAPES) ×2 IMPLANT
DRESSING MEPILEX FLEX 4X4 (GAUZE/BANDAGES/DRESSINGS) IMPLANT
DRSG EMULSION OIL 3X3 NADH (GAUZE/BANDAGES/DRESSINGS) ×1 IMPLANT
DRSG MEPILEX FLEX 4X4 (GAUZE/BANDAGES/DRESSINGS) ×1
DRSG MEPILEX POST OP 4X8 (GAUZE/BANDAGES/DRESSINGS) ×2 IMPLANT
DRSG WOUND ADHESIVE SYLKE 050 (GAUZE/BANDAGES/DRESSINGS) IMPLANT
ELECT BLADE 4.0 EZ CLEAN MEGAD (MISCELLANEOUS) ×2
ELECT CAUTERY BLADE 6.4 (BLADE) ×1 IMPLANT
ELECT NDL TIP 2.8 STRL (NEEDLE) IMPLANT
ELECT NEEDLE TIP 2.8 STRL (NEEDLE) IMPLANT
ELECT REM PT RETURN 9FT ADLT (ELECTROSURGICAL) ×1
ELECTRODE BLDE 4.0 EZ CLN MEGD (MISCELLANEOUS) ×2 IMPLANT
ELECTRODE REM PT RTRN 9FT ADLT (ELECTROSURGICAL) ×1 IMPLANT
EVACUATOR SILICONE 100CC (DRAIN) IMPLANT
EXTRACTOR CANIST REVOLVE STRL (CANNISTER) IMPLANT
GAUZE 4X4 16PLY ~~LOC~~+RFID DBL (SPONGE) ×1 IMPLANT
GAUZE PAD ABD 8X10 STRL (GAUZE/BANDAGES/DRESSINGS) ×2 IMPLANT
GAUZE SPONGE 4X4 12PLY STRL (GAUZE/BANDAGES/DRESSINGS) ×1 IMPLANT
GAUZE XEROFORM 5X9 LF (GAUZE/BANDAGES/DRESSINGS) IMPLANT
GLOVE BIO SURGEON STRL SZ 6.5 (GLOVE) ×2 IMPLANT
GLOVE BIOGEL M 6.5 STRL (GLOVE) ×4 IMPLANT
GLOVE SURG ENC TEXT LTX SZ7.5 (GLOVE) IMPLANT
GOWN STRL REUS W/ TWL LRG LVL3 (GOWN DISPOSABLE) ×3 IMPLANT
HEMOSTAT ARISTA ABSORB 3G PWDR (HEMOSTASIS) IMPLANT
KIT BASIN OR (CUSTOM PROCEDURE TRAY) ×1 IMPLANT
KIT TURNOVER KIT B (KITS) ×1 IMPLANT
MARKER SKIN DUAL TIP RULER LAB (MISCELLANEOUS) ×1 IMPLANT
NDL HYPO 25GX1X1/2 BEV (NEEDLE) ×1 IMPLANT
NDL SPNL 18GX3.5 QUINCKE PK (NEEDLE) IMPLANT
NEEDLE HYPO 25GX1X1/2 BEV (NEEDLE) ×1 IMPLANT
NEEDLE SPNL 18GX3.5 QUINCKE PK (NEEDLE) ×1 IMPLANT
NS IRRIG 1000ML POUR BTL (IV SOLUTION) ×1 IMPLANT
PACK GENERAL/GYN (CUSTOM PROCEDURE TRAY) ×1 IMPLANT
PAD ARMBOARD 7.5X6 YLW CONV (MISCELLANEOUS) ×1 IMPLANT
PAD FOAM SILICONE BACKED (GAUZE/BANDAGES/DRESSINGS) IMPLANT
PENCIL BUTTON HOLSTER BLD 10FT (ELECTRODE) ×1 IMPLANT
PENCIL SMOKE EVACUATOR (MISCELLANEOUS) ×1 IMPLANT
SPIKE FLUID TRANSFER (MISCELLANEOUS) IMPLANT
SPONGE T-LAP 18X18 ~~LOC~~+RFID (SPONGE) ×2 IMPLANT
STAPLER VISISTAT 35W (STAPLE) IMPLANT
STRIP CLOSURE SKIN 1/2X4 (GAUZE/BANDAGES/DRESSINGS) ×2 IMPLANT
SUT MNCRL AB 3-0 PS2 27 (SUTURE) IMPLANT
SUT MON AB 3-0 SH27 (SUTURE) IMPLANT
SUT MON AB 4-0 PS1 27 (SUTURE) IMPLANT
SUT MON AB 5-0 PS2 18 (SUTURE) IMPLANT
SUT PDS AB 2-0 CT2 27 (SUTURE) IMPLANT
SUT PDS AB 3-0 SH 27 (SUTURE) IMPLANT
SUT SILK 3 0 PS 1 (SUTURE) IMPLANT
SUT SILK 4 0 PS 2 (SUTURE) IMPLANT
SUT VIC AB 3-0 SH 27X BRD (SUTURE) IMPLANT
SUT VICRYL 4-0 PS2 18IN ABS (SUTURE) IMPLANT
SYR 10ML LL (SYRINGE) ×5 IMPLANT
SYR 50ML LL SCALE MARK (SYRINGE) IMPLANT
SYR BULB EAR ULCER 3OZ GRN STR (SYRINGE) IMPLANT
SYR CONTROL 10ML LL (SYRINGE) ×1 IMPLANT
TOWEL GREEN STERILE (TOWEL DISPOSABLE) ×1 IMPLANT
TOWEL GREEN STERILE FF (TOWEL DISPOSABLE) ×2 IMPLANT
TUBING INFILTRATION IT-10001 (TUBING) ×1 IMPLANT
TUBING SET GRADUATE ASPIR 12FT (MISCELLANEOUS) IMPLANT
YANKAUER SUCT BULB TIP NO VENT (SUCTIONS) IMPLANT

## 2023-11-14 NOTE — Anesthesia Procedure Notes (Addendum)
Procedure Name: Intubation Date/Time: 11/14/2023 10:38 AM  Performed by: Chari Manning, RNPre-anesthesia Checklist: Patient identified, Emergency Drugs available, Suction available and Patient being monitored Patient Re-evaluated:Patient Re-evaluated prior to induction Oxygen Delivery Method: Circle System Utilized Preoxygenation: Pre-oxygenation with 100% oxygen Induction Type: IV induction Ventilation: Mask ventilation without difficulty Laryngoscope Size: Mac and 3 Grade View: Grade II Tube type: Oral Tube size: 7.0 mm Number of attempts: 1 Airway Equipment and Method: Stylet and Oral airway Placement Confirmation: ETT inserted through vocal cords under direct vision, positive ETCO2 and breath sounds checked- equal and bilateral Secured at: 23 cm Tube secured with: Tape Dental Injury: Teeth and Oropharynx as per pre-operative assessment

## 2023-11-14 NOTE — Op Note (Signed)
DATE OF OPERATION: 11/14/2023  LOCATION: [facility] Main Operating Room Outpatient  PREOPERATIVE DIAGNOSIS: Right breast asymmetry after breast cancer treatment  POSTOPERATIVE DIAGNOSIS: Same  PROCEDURE:  Excision of medial right breast excess skin and soft tissue tissue 3 x 6 cm Lipofilling of right breast 150 cc   SURGEON: Foster Simpson, DO  ASSISTANT: Keenan Bachelor, PA  EBL: none  CONDITION: Stable  COMPLICATIONS: None  INDICATION: The patient, Janet Fowler, is a 73 y.o. female born on [birth year], is here for treatment of right breast asymmetry after treatment for breast cancer with right mastectomy.   PROCEDURE DETAILS:  The patient was seen prior to surgery and marked.  The IV antibiotics were given. The patient was taken to the operating room and given a general anesthetic. A standard time out was performed and all information was confirmed by those in the room. SCDs were placed.   The abdomen and chest were prepped and draped.  Local was injected at the lower abdominal area for a 1 cm area on each side and then in the medical right breast.  A #15 blade was used to make a small incision in the lower abdominal area.  Tumescent was infused into the abdominal adipose.  Then liposuction was performed and the adipose was collected in the Revolve.  The manufacture guidelines were used to collect the adipose 350 cc.  Using the guidelines the adipose was prepared.  The tissue was placed in 12 cc syringes.  The medial breast area was the excised of the excess skin and soft tissue which was 3 x 6 cm in size.  Hemostasis was achieved with electrocautery.  The deep layer was closed with the 3-0 PDS followed by the 3-0 and 4-0 Monocryl.  The harvested adipose was then injected into the medial right breast soft tissue area.  A total of 150 cc was injected of the collected adipose.  Sylk was used to cover all incisions after they were closed with the 4-0 Monocryl.  Sterile dressings were applied with  binders. The patient was allowed to wake up and taken to recovery room in stable condition at the end of the case. The family was notified at the end of the case.   The advanced practice practitioner (APP) assisted throughout the case.  The APP was essential in retraction and counter traction when needed to make the case progress smoothly.  This retraction and assistance made it possible to see the tissue plans for the procedure.  The assistance was needed for blood control, tissue re-approximation and assisted with closure of the incision site.

## 2023-11-14 NOTE — Discharge Instructions (Signed)

## 2023-11-14 NOTE — Transfer of Care (Signed)
Immediate Anesthesia Transfer of Care Note  Patient: Janet Fowler  Procedure(s) Performed: Excision of right medial breast tissue with fat grafting for improved symmetry (Right: Breast) LIPOSUCTION WITH LIPOFILLING (Right: Breast)  Patient Location: PACU  Anesthesia Type:General  Level of Consciousness: drowsy and patient cooperative  Airway & Oxygen Therapy: Patient Spontanous Breathing and Patient connected to face mask oxygen  Post-op Assessment: Report given to RN and Post -op Vital signs reviewed and stable  Post vital signs: Reviewed and stable  Last Vitals:  Vitals Value Taken Time  BP 158/71 11/14/23 1210  Temp 98.1   Pulse 83 11/14/23 1212  Resp 21 11/14/23 1212  SpO2 100 % 11/14/23 1212  Vitals shown include unfiled device data.  Last Pain:  Vitals:   11/14/23 0854  TempSrc:   PainSc: 0-No pain      Patients Stated Pain Goal: 0 (11/14/23 0854)  Complications: There were no known notable events for this encounter.

## 2023-11-14 NOTE — Interval H&P Note (Signed)
History and Physical Interval Note:  11/14/2023 10:01 AM  Janet Fowler  has presented today for surgery, with the diagnosis of history of breast cancer.  The various methods of treatment have been discussed with the patient and family. After consideration of risks, benefits and other options for treatment, the patient has consented to  Procedure(s): Excision of right medial breast tissue with fat grafting for improved symmetry (Right) LIPOSUCTION WITH LIPOFILLING (Right) as a surgical intervention.  The patient's history has been reviewed, patient examined, no change in status, stable for surgery.  I have reviewed the patient's chart and labs.  Questions were answered to the patient's satisfaction.     Alena Bills Aella Ronda

## 2023-11-14 NOTE — Anesthesia Preprocedure Evaluation (Addendum)
Anesthesia Evaluation  Patient identified by MRN, date of birth, ID band Patient awake    Reviewed: Allergy & Precautions, NPO status , Patient's Chart, lab work & pertinent test results  Airway Mallampati: II  TM Distance: >3 FB Neck ROM: Full    Dental  (+) Dental Advisory Given, Chipped,    Pulmonary neg pulmonary ROS   Pulmonary exam normal breath sounds clear to auscultation       Cardiovascular hypertension, Pt. on medications Normal cardiovascular exam Rhythm:Regular Rate:Normal     Neuro/Psych negative neurological ROS  negative psych ROS   GI/Hepatic negative GI ROS, Neg liver ROS,,,  Endo/Other  negative endocrine ROS    Renal/GU negative Renal ROS  negative genitourinary   Musculoskeletal negative musculoskeletal ROS (+)    Abdominal   Peds  Hematology negative hematology ROS (+)   Anesthesia Other Findings   Reproductive/Obstetrics                             Anesthesia Physical Anesthesia Plan  ASA: 2  Anesthesia Plan: General   Post-op Pain Management: Tylenol PO (pre-op)*   Induction: Intravenous  PONV Risk Score and Plan: 3 and Dexamethasone, Ondansetron and Treatment may vary due to age or medical condition  Airway Management Planned: Oral ETT  Additional Equipment:   Intra-op Plan:   Post-operative Plan: Extubation in OR  Informed Consent: I have reviewed the patients History and Physical, chart, labs and discussed the procedure including the risks, benefits and alternatives for the proposed anesthesia with the patient or authorized representative who has indicated his/her understanding and acceptance.     Dental advisory given  Plan Discussed with: CRNA  Anesthesia Plan Comments:        Anesthesia Quick Evaluation

## 2023-11-15 ENCOUNTER — Encounter: Payer: Self-pay | Admitting: Internal Medicine

## 2023-11-15 LAB — SURGICAL PATHOLOGY

## 2023-11-15 NOTE — Anesthesia Postprocedure Evaluation (Signed)
Anesthesia Post Note  Patient: STARNISHA BATREZ  Procedure(s) Performed: Excision of right medial breast tissue with fat grafting for improved symmetry (Right: Breast) LIPOSUCTION WITH LIPOFILLING (Right: Breast)     Patient location during evaluation: PACU Anesthesia Type: General Level of consciousness: awake and alert Pain management: pain level controlled Vital Signs Assessment: post-procedure vital signs reviewed and stable Respiratory status: spontaneous breathing, nonlabored ventilation, respiratory function stable and patient connected to nasal cannula oxygen Cardiovascular status: blood pressure returned to baseline and stable Postop Assessment: no apparent nausea or vomiting Anesthetic complications: no  There were no known notable events for this encounter.  Last Vitals:  Vitals:   11/14/23 1330 11/14/23 1345  BP: (!) 185/75 124/69  Pulse: 72 70  Resp: 20 20  Temp:  36.7 C  SpO2: 98% 96%    Last Pain:  Vitals:   11/14/23 1345  TempSrc:   PainSc: 0-No pain   Pain Goal: Patients Stated Pain Goal: 0 (11/14/23 0854)                 Tyreque Finken L Fawaz Borquez

## 2023-11-16 ENCOUNTER — Encounter (HOSPITAL_COMMUNITY): Payer: Self-pay | Admitting: Plastic Surgery

## 2023-11-16 ENCOUNTER — Ambulatory Visit: Payer: Self-pay | Admitting: Surgical

## 2023-11-16 DIAGNOSIS — Z9011 Acquired absence of right breast and nipple: Secondary | ICD-10-CM

## 2023-11-16 DIAGNOSIS — D0511 Intraductal carcinoma in situ of right breast: Secondary | ICD-10-CM

## 2023-11-16 DIAGNOSIS — Z9889 Other specified postprocedural states: Secondary | ICD-10-CM

## 2023-11-16 NOTE — Progress Notes (Signed)
     Patient ID: Janet Fowler, female    DOB: 18-Aug-1951, 73 y.o.   MRN: [id number]     ICD-10-CM   1. Ductal carcinoma in situ (DCIS) of right breast  D05.11     2. Acquired absence of right breast  Z90.11     3. S/P breast reconstruction  Z98.890       History of Present Illness: Janet Fowler is a 73 y.o.  female who presents for virtual post operative evaluation after excision of medial right breast excess skin and soft tissue and fat grafting to right breast with Dr.  Ulice Bold 2 days ago.  Patient reports she is doing really well, she reports pain is well-controlled.  She is not having any infectious symptoms.  She reports he is eating and drinking normally, slowly increasing diet.  She is urinating and having normal bowel movements without any issues.  She does have some questions about showering and continuing with compressive garments.  The patient gave consent to have this visit done by telemedicine / virtual visit, two identifiers were used to identify patient. This is also consent for access the chart and treat the patient via this visit. The patient is located in [place].  I, the provider, am at the office.  We spent 5 minutes together for the visit.  Joined by phone.  Assessment/Plan:  [name] is doing really well, she is not having any issues after surgery.  We discussed that she can shower, but should reapply compressive garments and dressings after showering.  Encouraged her to call with any further questions or concerns.  Recommend continuing to avoid strenuous activities/heavy lifting

## 2023-11-20 ENCOUNTER — Encounter: Payer: Self-pay | Admitting: Plastic Surgery

## 2023-11-20 ENCOUNTER — Ambulatory Visit (INDEPENDENT_AMBULATORY_CARE_PROVIDER_SITE_OTHER): Payer: Medicare HMO | Admitting: Plastic Surgery

## 2023-11-20 VITALS — BP 128/83 | HR 86

## 2023-11-20 DIAGNOSIS — Z9011 Acquired absence of right breast and nipple: Secondary | ICD-10-CM

## 2023-11-20 NOTE — Progress Notes (Signed)
The patient is a 73 year old female here for follow-up after undergoing surgery last week.  On February 12 she had excision of the right medial breast and fat grafting.  Overall she is doing really well.  There does not appear to be any seroma or hematoma.  She has bruising on the abdomen and the breast as expected.  The patient saw the before and after pictures and acknowledges of a nice difference.  She should continue with the Lipofoam on the abdomen and a sports bra.  Will see her back in 1 to 2 weeks.  Pictures were obtained of the patient and placed in the chart with the patient's or guardian's permission.

## 2023-12-04 ENCOUNTER — Ambulatory Visit: Payer: Medicare HMO | Admitting: Surgical

## 2023-12-04 VITALS — BP 131/85 | HR 83

## 2023-12-04 DIAGNOSIS — Z9011 Acquired absence of right breast and nipple: Secondary | ICD-10-CM

## 2023-12-04 DIAGNOSIS — Z9889 Other specified postprocedural states: Secondary | ICD-10-CM

## 2023-12-04 DIAGNOSIS — D0511 Intraductal carcinoma in situ of right breast: Secondary | ICD-10-CM

## 2023-12-04 NOTE — Progress Notes (Signed)
 73 year old female here for follow-up after excision of medial right breast excess skin and soft tissue and fat grafting to right breast on 11/14/2023.  She is 3 weeks postop.  She is doing really well without any issues.  She does report some ongoing tenderness to the left side of her abdomen but is otherwise doing well.  He is not having any infectious symptoms.  She has been continue to wear compression.  On exam bilateral breast incisions are intact, right NAC is surgically absent.  Left NAC is present, good color noted.  Bilateral breast incisions are intact and appear to be healing well.  Abdominal incisions are intact and healing well.  No swelling or ecchymosis is noted.  No subcutaneous fluid collection noted with palpation. No erythema or cellulitic changes noted of either breast or abdomen.   A/P:  Patient is doing well, recommend continue with compression garments to her breast and abdomen.  No longer necessary to wear TopiFoam, can switch from abdominal binder to compressive spanks/shorts or other abdominal garments.  Recommend calling with questions or concerns, follow-up in 2 to 3 weeks for reevaluation.  Patient reports she is not interested in any further procedures such as fat grafting in the future or nipple areola tattoo on the right.  We will plan to take picture at next appointment.

## 2023-12-18 ENCOUNTER — Ambulatory Visit: Payer: Medicare Other | Admitting: Hematology

## 2023-12-18 ENCOUNTER — Other Ambulatory Visit: Payer: Medicare Other

## 2023-12-18 ENCOUNTER — Ambulatory Visit (INDEPENDENT_AMBULATORY_CARE_PROVIDER_SITE_OTHER): Payer: Medicare HMO | Admitting: Surgical

## 2023-12-18 VITALS — BP 149/86 | HR 66

## 2023-12-18 DIAGNOSIS — D0511 Intraductal carcinoma in situ of right breast: Secondary | ICD-10-CM

## 2023-12-18 DIAGNOSIS — Z9889 Other specified postprocedural states: Secondary | ICD-10-CM

## 2023-12-18 DIAGNOSIS — Z9011 Acquired absence of right breast and nipple: Secondary | ICD-10-CM

## 2023-12-18 NOTE — Progress Notes (Signed)
 73 year old female here for follow-up after excision of medial right breast excess skin and soft tissue and fat grafting to right breast on 11/14/2023 with Dr. Ulice Bold.  She is 5 weeks postop.  She reports she is overall doing well.  Pain is well-controlled at this time without any issues.  She does report that she feels like she still has excess skin of the medial right breast and would like this to be improved if it does not improve over the next few months.  On exam right breast incision is intact, healing well.  She does have some excess skin of the medial right breast.  There is no ecchymosis noted.  No subcutaneous fluid collection noted.  There is no erythema or cellulitic changes noted.  A/P:  Discussed with patient that I do appreciate some excess skin present, discussed with patient that this may retract and tighten over the next few months, recommend closely monitoring it.  She was encouraged to follow-up in about 6 months for reevaluation to discuss additional excision if she does not notice significant improvement by then.  Patient was agreeable with this plan.  All of her questions were answered to her content.  She is aware to call with questions or concerns or if her symptoms change prior to follow-up in 6 months.

## 2023-12-20 ENCOUNTER — Other Ambulatory Visit: Payer: Self-pay | Admitting: Hematology

## 2024-01-21 ENCOUNTER — Ambulatory Visit: Payer: Self-pay | Admitting: Internal Medicine

## 2024-01-21 NOTE — Assessment & Plan Note (Signed)
-  High grade, ER+/PR+ --Diagnosed in January 2024, discovered on screening mammogram. -she underwent a right breast mastectomy on first 2024, which showed high-grade DCIS, solid type with necrosis and calcifications.  Negative for invasive carcinoma.  Margins were negative. -We discussed moderate risk of future breast cancer in left breast, I recommended low-dose tamoxifen  5 mg daily for 3 years, she started in 06/2023 but stopped due to leg cramps. She was switched to anastrozole

## 2024-01-22 ENCOUNTER — Inpatient Hospital Stay (HOSPITAL_BASED_OUTPATIENT_CLINIC_OR_DEPARTMENT_OTHER): Payer: Self-pay | Admitting: Hematology

## 2024-01-22 ENCOUNTER — Inpatient Hospital Stay: Payer: Self-pay | Attending: Hematology

## 2024-01-22 VITALS — BP 138/62 | HR 90 | Temp 98.3°F | Resp 22 | Ht 62.0 in | Wt 206.4 lb

## 2024-01-22 DIAGNOSIS — Z1721 Progesterone receptor positive status: Secondary | ICD-10-CM | POA: Diagnosis not present

## 2024-01-22 DIAGNOSIS — I10 Essential (primary) hypertension: Secondary | ICD-10-CM | POA: Insufficient documentation

## 2024-01-22 DIAGNOSIS — Z9011 Acquired absence of right breast and nipple: Secondary | ICD-10-CM | POA: Diagnosis not present

## 2024-01-22 DIAGNOSIS — Z79811 Long term (current) use of aromatase inhibitors: Secondary | ICD-10-CM | POA: Insufficient documentation

## 2024-01-22 DIAGNOSIS — D0511 Intraductal carcinoma in situ of right breast: Secondary | ICD-10-CM | POA: Insufficient documentation

## 2024-01-22 DIAGNOSIS — Z170 Estrogen receptor positive status [ER+]: Secondary | ICD-10-CM | POA: Diagnosis not present

## 2024-01-22 LAB — CMP (CANCER CENTER ONLY)
ALT: 6 U/L (ref 0–44)
AST: 14 U/L — ABNORMAL LOW (ref 15–41)
Albumin: 4.7 g/dL (ref 3.5–5.0)
Alkaline Phosphatase: 77 U/L (ref 38–126)
Anion gap: 6 (ref 5–15)
BUN: 12 mg/dL (ref 8–23)
CO2: 27 mmol/L (ref 22–32)
Calcium: 9.8 mg/dL (ref 8.9–10.3)
Chloride: 105 mmol/L (ref 98–111)
Creatinine: 1.02 mg/dL — ABNORMAL HIGH (ref 0.44–1.00)
GFR, Estimated: 58 mL/min — ABNORMAL LOW (ref >60)
Glucose, Bld: 103 mg/dL — ABNORMAL HIGH (ref 70–99)
Potassium: 4.3 mmol/L (ref 3.5–5.1)
Sodium: 138 mmol/L (ref 135–145)
Total Bilirubin: 0.4 mg/dL (ref 0.0–1.2)
Total Protein: 8.2 g/dL — ABNORMAL HIGH (ref 6.5–8.1)

## 2024-01-22 LAB — CBC WITH DIFFERENTIAL (CANCER CENTER ONLY)
Abs Immature Granulocytes: 0.01 10*3/uL (ref 0.00–0.07)
Basophils Absolute: 0 10*3/uL (ref 0.0–0.1)
Basophils Relative: 1 %
Eosinophils Absolute: 0.1 10*3/uL (ref 0.0–0.5)
Eosinophils Relative: 3 %
HCT: 40.4 % (ref 36.0–46.0)
Hemoglobin: 13.6 g/dL (ref 12.0–15.0)
Immature Granulocytes: 0 %
Lymphocytes Relative: 36 %
Lymphs Abs: 2 10*3/uL (ref 0.7–4.0)
MCH: 29.4 pg (ref 26.0–34.0)
MCHC: 33.7 g/dL (ref 30.0–36.0)
MCV: 87.4 fL (ref 80.0–100.0)
Monocytes Absolute: 0.4 10*3/uL (ref 0.1–1.0)
Monocytes Relative: 7 %
Neutro Abs: 3.1 10*3/uL (ref 1.7–7.7)
Neutrophils Relative %: 53 %
Platelet Count: 272 10*3/uL (ref 150–400)
RBC: 4.62 MIL/uL (ref 3.87–5.11)
RDW: 12.3 % (ref 11.5–15.5)
WBC Count: 5.7 10*3/uL (ref 4.0–10.5)
nRBC: 0 % (ref 0.0–0.2)

## 2024-01-22 NOTE — Progress Notes (Signed)
 [facility]   [phone] [phone]   Clinic Follow up Note   Patient Care Team: Lawrance Presume, MD as PCP - General (Internal Medicine) Caralyn Chandler, MD as Consulting Physician (General Surgery) Sonja [place], MD as Consulting Physician (Hematology) Johna Myers, MD as Consulting Physician (Radiation Oncology) Auther Bo, RN as Oncology Nurse Navigator Alane Hsu, RN as Oncology Nurse Navigator Burton, Lacie K, NP as Nurse Practitioner (Nurse Practitioner)  Date of Service:  01/22/2024  CHIEF COMPLAINT: f/u of DCIS  CURRENT THERAPY:  Anastrozole   Oncology History   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 -she underwent a right breast mastectomy on first 2024, which showed high-grade DCIS, solid type with necrosis and calcifications.  Negative for invasive carcinoma.  Margins were negative. -We discussed moderate risk of future breast cancer in left breast, I recommended low-dose tamoxifen  5 mg daily for 3 years, she started in 06/2023 but stopped due to leg cramps. 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 Currently on anastrozole  after switching from tamoxifen  due to cramps, with no significant side effects. Recent mammogram in January was negative, breast density category B. MRI deemed unnecessary due to right mastectomy. No pain or discomfort related to previous surgery. - Continue anastrozole  therapy. - No need for breast MRI due to previous mastectomy. - Follow up with bone density scan scheduled for September 11.  Plan - She is clinically doing well, tolerating anastrozole  very well.  Will continue - Lab and follow-up in 6 months   SUMMARY OF ONCOLOGIC HISTORY: Oncology History Overview Note   Cancer  Staging  Ductal carcinoma in situ (DCIS) of right breast Staging form: Breast, AJCC 8th Edition - Clinical stage from 10/30/2022: Stage 0 (cTis (DCIS), cN0, cM0, G3, ER+, PR+, HER2: Not Assessed) - Signed by Sonja [place], MD on 11/07/2022 Stage prefix: Initial diagnosis Histologic grading system: 3 grade system     Ductal carcinoma in situ (DCIS) of right breast  10/30/2022 Cancer Staging   Staging form: Breast, AJCC 8th Edition - Clinical stage from 10/30/2022: Stage 0 (cTis (DCIS), cN0, cM0, G3, ER+, PR+, HER2: Not Assessed) - Signed by Sonja [place], MD on 11/07/2022 Stage prefix: Initial diagnosis Histologic grading system: 3 grade system   11/06/2022 Initial Diagnosis   Ductal carcinoma in situ (DCIS) of right breast   12/21/2022 Pathology Results    FINAL MICROSCOPIC DIAGNOSIS:  A. RIGHT BREAST, MASTECTOMY: High-grade ductal carcinoma in situ, solid type with necrosis and calcifications, two foci Negative for invasive carcinoma DCIS involves 5 of 14 submitted blocks (excluding the benign nipple) Margins free (DCIS 0.75 mm from the inferior margin) Changes consistent with prior biopsies (coil clip and X clip) Prognostic markers (from report [id number]): Estrogen receptor positive, progesterone receptor positive  B. RIGHT AXILLARY SENTINEL LYMPH NODE #1, EXCISION: One benign lymph node, negative for carcinoma (0/1)  C. RIGHT AXILLARY SENTINEL LYMPH NODE, EXCISION: One benign lymph node with lipogranulomata, negative for carcinoma (0/1)  D. RIGHT AXILLARY SENTINEL LYMPH NODE, EXCISION: One benign lymph node, negative for carcinoma (0/1)  E. RIGHT AXILLARY SENTINEL LYMPH NODE, EXCISION: One benign lymph node, negative for carcinoma (0/1)  F. RIGHT AXILLARY SENTINEL LYMPH NODE #2, EXCISION: One benign lymph node with lipogranulomata, negative for carcinoma (0/1)  G. RIGHT AXILLARY  SENTINEL LYMPH NODE, EXCISION: One benign lymph node with lipogranulomata, negative for carcinoma  (0/1)  H. RIGHT AXILLARY SENTINEL LYMPH NODE, EXCISION: One benign lymph node, negative for carcinoma (0/1)  ONCOLOGY TABLE:  DCIS OF THE BREAST:  Resection Procedure: Lumpectomy Specimen Laterality: Right Histologic Type: Ductal carcinoma in situ Size of DCIS: DCIS involves 5 of 14 submitted blocks (excluding the nipple) Nuclear Grade: High-grade Necrosis: Present Margins: All margins negative for DCIS      Specify Closest Margin (required only if <70mm): DCIS 0.75 mm from the inferior margin Regional Lymph Nodes:      Number of Lymph Nodes Examined: 7      Number of Sentinel Nodes Examined (if applicable): 7      Number of Lymph Nodes with Macrometastases: 0      Number of Lymph Nodes with Micrometastases): 0      Number of Lymph Nodes with Isolated Tumor Cells (=0.2 mm or =200 cells): 0 Breast Biomarker Testing Performed on Previous Biopsy:      Testing performed on Case Number: [id number]      Estrogen Receptor: Positive (95%, strong staining)      Progesterone Receptor: Positive (30%, strong staining) Pathologic Stage Classification (pTNM, AJCC 8th Edition): pTis, pN0 Representative Tumor Block: A2, A7 Comment(s): (v4.4.0.0)    12/21/2022 Cancer Staging   Staging form: Breast, AJCC 8th Edition - Pathologic stage from 12/21/2022: Stage 0 (pTis (DCIS), pN0, cM0, G3, ER+, PR+, HER2: Not Assessed) - Signed by Sonja [place], MD on 01/17/2023 Histologic grading system: 3 grade system Residual tumor (R): R0 - None      Discussed the use of AI scribe software for clinical note transcription with the patient, who gave verbal consent to proceed.  History of Present Illness The patient, a 73 year old female with a history of breast cancer, presents for a routine follow-up. She reports no pain or stomach issues since her last visit. She is currently on anastrozole , with no reported side effects such as hot flashes or joint pain. She mentions a slight discomfort in her shoulder, which  she attributes to her sleeping position. She engages in regular exercise, specifically chair yoga.  Previously, she was on tamoxifen  but switched to anastrozole  due to cramping. She reports doing much better on anastrozole . Her most recent mammogram in January was negative, and her breast density is not high. She has a history of DCIS (stage zero or precancer lesion), which was removed and cured. However, she is at risk for future breast cancer, which is why she is on tamoxifen  and anastrozole .  She also mentions a skin biopsy on the right breast in February, which was benign. She reports some discomfort from previous surgery but no current pain. She has a breast implant following a mastectomy and has undergone a breast reduction. She also has a bone density test scheduled for September.     All other systems were reviewed with the patient and are negative.  MEDICAL HISTORY:  Past Medical History:  Diagnosis Date   Breast cancer (HCC) 12/2022   right breast DCIS   Hypertension     SURGICAL HISTORY: Past Surgical History:  Procedure Laterality Date   BIOPSY THYROID  Bilateral 2024   BREAST BIOPSY Right 10/30/2022   MM RT BREAST BX W LOC DEV 1ST LESION IMAGE BX SPEC STEREO GUIDE 10/30/2022 [facility]   BREAST BIOPSY Right 10/30/2022   MM RT BREAST BX W LOC DEV EA AD LESION IMG BX SPEC STEREO GUIDE 10/30/2022 [facility]  BREAST RECONSTRUCTION WITH PLACEMENT OF TISSUE EXPANDER AND FLEX HD (ACELLULAR HYDRATED DERMIS) Right 12/21/2022   Procedure: IMMEDIATE RIGHT BREAST RECONSTRUCTION WITH PLACEMENT OF TISSUE EXPANDER AND FLEX HD (ACELLULAR HYDRATED DERMIS);  Surgeon: Thornell Flirt, DO;  Location: [facility] OR;  Service: Plastics;  Laterality: Right;   BREAST REDUCTION SURGERY Left 04/26/2023   Procedure: MAMMARY REDUCTION LEFT BREAST;  Surgeon: Thornell Flirt, DO;  Location: [facility];  Service: Plastics;  Laterality: Left;   COLONOSCOPY  2022    LIPOSUCTION Bilateral 04/26/2023   Procedure: LIPOSUCTION BILATERAL BREAST;  Surgeon: Thornell Flirt, DO;  Location: [facility];  Service: Plastics;  Laterality: Bilateral;   LIPOSUCTION WITH LIPOFILLING Right 11/14/2023   Procedure: LIPOSUCTION WITH LIPOFILLING;  Surgeon: Thornell Flirt, DO;  Location: [facility] OR;  Service: Plastics;  Laterality: Right;   REMOVAL OF TISSUE EXPANDER AND PLACEMENT OF IMPLANT Right 04/26/2023   Procedure: REMOVAL OF RIGHT BREAST TISSUE EXPANDER AND PLACEMENT OF IMPLANT;  Surgeon: Thornell Flirt, DO;  Location: [facility];  Service: Plastics;  Laterality: Right;   SENTINEL NODE BIOPSY Right 12/21/2022   Procedure: SENTINEL NODE BIOPSY;  Surgeon: Caralyn Chandler, MD;  Location: [facility] OR;  Service: General;  Laterality: Right;   TONSILLECTOMY     TOTAL MASTECTOMY Right 12/21/2022   Procedure: RIGHT MASTECTOMY;  Surgeon: Caralyn Chandler, MD;  Location: [facility] OR;  Service: General;  Laterality: Right;  GEN & PEC BLOCK    I have reviewed the social history and family history with the patient and they are unchanged from previous note.  ALLERGIES:  is allergic to steri-strip compound benzoin [benzoin].  MEDICATIONS:  Current Outpatient Medications  Medication Sig Dispense Refill   amLODipine  (NORVASC ) 10 MG tablet Take 1 tablet (10 mg total) by mouth daily. 90 tablet 1   anastrozole  (ARIMIDEX ) 1 MG tablet TAKE 1 TABLET(1 MG) BY MOUTH DAILY 30 tablet 1   No current facility-administered medications for this visit.    PHYSICAL EXAMINATION: ECOG PERFORMANCE STATUS: 0 - Asymptomatic  Vitals:   01/22/24 1419  BP: 138/62  Pulse: 90  Resp: (!) 22  Temp: 98.3 F (36.8 C)  SpO2: 99%   Wt Readings from Last 3 Encounters:  01/22/24 206 lb 6.4 oz (93.6 kg)  11/14/23 208 lb (94.3 kg)  10/25/23 208 lb 9.6 oz (94.6 kg)     GENERAL:alert, no distress and comfortable SKIN: skin color, texture, turgor are normal, no rashes or  significant lesions EYES: normal, Conjunctiva are pink and non-injected, sclera clear NECK: supple, thyroid  normal size, non-tender, without nodularity LYMPH:  no palpable lymphadenopathy in the cervical, axillary  LUNGS: clear to auscultation and percussion with normal breathing effort HEART: regular rate & rhythm and no murmurs and no lower extremity edema ABDOMEN:abdomen soft, non-tender and normal bowel sounds Musculoskeletal:no cyanosis of digits and no clubbing  NEURO: alert & oriented x 3 with fluent speech, no focal motor/sensory deficits Breasts: Breast inspection showed right mastectomy with implant. Palpation of the left breast and axilla revealed no obvious mass that I could appreciate.  Physical Exam    LABORATORY DATA:  I have reviewed the data as listed    Latest Ref Rng & Units 01/22/2024    1:50 PM 11/14/2023    8:50 AM 06/20/2023    1:44 PM  CBC  WBC 4.0 - 10.5 K/uL 5.7  4.3  4.8   Hemoglobin 12.0 - 15.0 g/dL 95.1  88.4  13.9  Hematocrit 36.0 - 46.0 % 40.4  43.6  41.6   Platelets 150 - 400 K/uL 272  247  258         Latest Ref Rng & Units 01/22/2024    1:50 PM 11/14/2023    8:50 AM 06/20/2023    1:44 PM  CMP  Glucose 70 - 99 mg/dL 488  91  94   BUN 8 - 23 mg/dL 12  9  12    Creatinine 0.44 - 1.00 mg/dL 8.91  6.94  5.03   Sodium 135 - 145 mmol/L 138  140  137   Potassium 3.5 - 5.1 mmol/L 4.3  4.0  4.0   Chloride 98 - 111 mmol/L 105  104  106   CO2 22 - 32 mmol/L 27  21  24    Calcium 8.9 - 10.3 mg/dL 9.8  88.8  9.9   Total Protein 6.5 - 8.1 g/dL 8.2   8.6   Total Bilirubin 0.0 - 1.2 mg/dL 0.4   0.5   Alkaline Phos 38 - 126 U/L 77   69   AST 15 - 41 U/L 14   16   ALT 0 - 44 U/L 6   8       RADIOGRAPHIC STUDIES: I have personally reviewed the radiological images as listed and agreed with the findings in the report. No results found.    No orders of the defined types were placed in this encounter.  All questions were answered. The patient knows to  call the clinic with any problems, questions or concerns. No barriers to learning was detected. The total time spent in the appointment was 25 minutes.     Sonja [place], MD 01/22/2024

## 2024-01-29 ENCOUNTER — Ambulatory Visit: Payer: Medicare HMO | Admitting: Plastic Surgery

## 2024-02-21 ENCOUNTER — Ambulatory Visit: Admitting: Internal Medicine

## 2024-02-22 ENCOUNTER — Other Ambulatory Visit: Payer: Self-pay | Admitting: Hematology

## 2024-04-01 ENCOUNTER — Ambulatory Visit: Admitting: Internal Medicine

## 2024-04-17 ENCOUNTER — Ambulatory Visit: Admitting: Internal Medicine

## 2024-04-19 ENCOUNTER — Other Ambulatory Visit: Payer: Self-pay | Admitting: Internal Medicine

## 2024-04-19 ENCOUNTER — Other Ambulatory Visit: Payer: Self-pay | Admitting: Hematology

## 2024-04-19 DIAGNOSIS — I10 Essential (primary) hypertension: Secondary | ICD-10-CM

## 2024-05-13 ENCOUNTER — Ambulatory Visit: Attending: Internal Medicine | Admitting: Internal Medicine

## 2024-05-13 ENCOUNTER — Encounter: Payer: Self-pay | Admitting: Internal Medicine

## 2024-05-13 VITALS — BP 134/76 | HR 62 | Temp 98.0°F | Ht 62.0 in | Wt 199.0 lb

## 2024-05-13 DIAGNOSIS — E66812 Obesity, class 2: Secondary | ICD-10-CM | POA: Diagnosis not present

## 2024-05-13 DIAGNOSIS — Z6836 Body mass index (BMI) 36.0-36.9, adult: Secondary | ICD-10-CM | POA: Diagnosis not present

## 2024-05-13 DIAGNOSIS — Z853 Personal history of malignant neoplasm of breast: Secondary | ICD-10-CM

## 2024-05-13 DIAGNOSIS — I10 Essential (primary) hypertension: Secondary | ICD-10-CM | POA: Diagnosis not present

## 2024-05-13 DIAGNOSIS — E042 Nontoxic multinodular goiter: Secondary | ICD-10-CM

## 2024-05-13 DIAGNOSIS — Z23 Encounter for immunization: Secondary | ICD-10-CM

## 2024-05-13 MED ORDER — AMLODIPINE BESYLATE 10 MG PO TABS
10.0000 mg | ORAL_TABLET | Freq: Every day | ORAL | 1 refills | Status: AC
Start: 2024-05-13 — End: ?

## 2024-05-13 NOTE — Progress Notes (Signed)
 Patient ID: Janet Fowler, female    DOB: 07/06/1951  MRN: [id number]  CC: Hypertension (HTN f/u. Med refill. /No questions / concerns/Yes to pneumonia vax. No to shingles vax)   Subjective: Janet Fowler is a 73 y.o. female who presents for chronic ds management. 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  On Arimidex )   Discussed the use of AI scribe software for clinical note transcription with the patient, who gave verbal consent to proceed.  History of Present Illness Janet Fowler is a 73 year old female who presents for follow-up.  HTN: She has a history of hypertension and is currently on amlodipine  10 mg daily. Her blood pressure was slightly elevated last week, with readings around 157/unknown. She did not check her blood pressure yesterday or today. She is limiting her salt intake. No chest pain, shortness of breath, or leg swelling.  She has a history of breast cancer/DCIS and underwent a right mastectomy. She had a mammogram in January for the left breast which was okay. She had plastic surgery on the right breast to remove excess skin, but the procedure did not achieve the desired results. She is currently on anastrozole .  Obesity: Her weight has decreased from 212 pounds in December to 199 pounds currently. She has improved her eating habits, stopped eating fast food, and now cooks her own meals. She drinks water and avoids sugary drinks. She engages in chair yoga once a week and plans to start water aerobics at the [facility].  Thyroid  Nodules: She has thyroid  nodules and underwent a repeat thyroid  ultrasound in January 2025, which showed no significant changes except for one nodule located centrally. A nodule in the left lower lobe, previously biopsied twice  without adequate cells, has not changed in size. No problems with swallowing, neck lumps, or shortness of breath. Plan to repeat US  in Jan 2026    Patient Active Problem List   Diagnosis Date Noted   Acquired absence of right breast 08/14/2023   Breast cancer (HCC) 12/21/2022   Ductal carcinoma in situ (DCIS) of right breast 11/06/2022   Mixed hyperlipidemia 10/17/2022   Thyroid  nodule 10/17/2022   Other malformations of cerebral vessels 10/17/2022   Essential hypertension 05/23/2022     Current Outpatient Medications on File Prior to Visit  Medication Sig Dispense Refill   amLODipine  (NORVASC ) 10 MG tablet TAKE 1 TABLET(10 MG) BY MOUTH DAILY 30 tablet 0   anastrozole  (ARIMIDEX ) 1 MG tablet TAKE 1 TABLET(1 MG) BY MOUTH DAILY 30 tablet 1   No current facility-administered medications on file prior to visit.    Allergies  Allergen Reactions   Steri-Strip Compound Benzoin [Benzoin] Other (See Comments)    Steri-Strips cause blisters on the skin    Social History   Socioeconomic History   Marital status: Single    Spouse name: Not on file   Number of children: 0   Years of education: Not on file   Highest education level: Bachelor's degree (e.g., BA, AB, BS)  Occupational History   Occupation: Retired Dipl[employer] Services operational officer  Tobacco Use   Smoking status: Never   Smokeless tobacco: Never  Vaping Use   Vaping status: Never Used  Substance and Sexual Activity   Alcohol use: Never   Drug use: Never  Sexual activity: Not Currently    Birth control/protection: None, Post-menopausal  Other Topics Concern   Not on file  Social History Narrative   Not on file   Social Drivers of Health   Financial Resource Strain: Medium Risk (05/09/2024)   Overall Financial Resource Strain (CARDIA)    Difficulty of Paying Living Expenses: Somewhat hard  Food Insecurity: No Food Insecurity (05/09/2024)   Hunger Vital Sign    Worried About Running Out of Food in the Last Year: Never true    Ran Out of  Food in the Last Year: Never true  Transportation Needs: No Transportation Needs (05/09/2024)   PRAPARE - Administrator, Civil Service (Medical): No    Lack of Transportation (Non-Medical): No  Physical Activity: Insufficiently Active (05/09/2024)   Exercise Vital Sign    Days of Exercise per Week: 2 days    Minutes of Exercise per Session: 60 min  Stress: No Stress Concern Present (05/09/2024)   [employer] of Occupational Health - Occupational Stress Questionnaire    Feeling of Stress: Not at all  Social Connections: Moderately Integrated (05/09/2024)   Social Connection and Isolation Panel    Frequency of Communication with Friends and Family: More than three times a week    Frequency of Social Gatherings with Friends and Family: Once a week    Attends Religious Services: More than 4 times per year    Active Member of Clubs or Organizations: Yes    Attends Banker Meetings: More than 4 times per year    Marital Status: Never married  Intimate Partner Violence: Not At Risk (12/21/2022)   Humiliation, Afraid, Rape, and Kick questionnaire    Fear of Current or Ex-Partner: No    Emotionally Abused: No    Physically Abused: No    Sexually Abused: No    Family History  Problem Relation Age of Onset   Hypertension Mother    Arthritis Mother    Stroke Father    Hypertension Father    Colon cancer Brother 37 - 56   Prostate cancer Brother 79 - 44   Prostate cancer Brother 66 - 35    Past Surgical History:  Procedure Laterality Date   BIOPSY THYROID  Bilateral 2024   BREAST BIOPSY Right 10/30/2022   MM RT BREAST BX W LOC DEV 1ST LESION IMAGE BX SPEC STEREO GUIDE 10/30/2022 [facility]   BREAST BIOPSY Right 10/30/2022   MM RT BREAST BX W LOC DEV EA AD LESION IMG BX SPEC STEREO GUIDE 10/30/2022 [facility]   BREAST RECONSTRUCTION WITH PLACEMENT OF TISSUE EXPANDER AND FLEX HD (ACELLULAR HYDRATED DERMIS) Right 12/21/2022   Procedure: IMMEDIATE  RIGHT BREAST RECONSTRUCTION WITH PLACEMENT OF TISSUE EXPANDER AND FLEX HD (ACELLULAR HYDRATED DERMIS);  Surgeon: Lowery Estefana RAMAN, DO;  Location: [facility];  Service: Plastics;  Laterality: Right;   BREAST REDUCTION SURGERY Left 04/26/2023   Procedure: MAMMARY REDUCTION LEFT BREAST;  Surgeon: Lowery Estefana RAMAN, DO;  Location: [facility];  Service: Plastics;  Laterality: Left;   COLONOSCOPY  2022   LIPOSUCTION Bilateral 04/26/2023   Procedure: LIPOSUCTION BILATERAL BREAST;  Surgeon: Lowery Estefana RAMAN, DO;  Location: [facility];  Service: Plastics;  Laterality: Bilateral;   LIPOSUCTION WITH LIPOFILLING Right 11/14/2023   Procedure: LIPOSUCTION WITH LIPOFILLING;  Surgeon: Lowery Estefana RAMAN, DO;  Location: [facility];  Service: Plastics;  Laterality: Right;   REMOVAL OF TISSUE EXPANDER AND PLACEMENT OF IMPLANT Right 04/26/2023   Procedure:  REMOVAL OF RIGHT BREAST TISSUE EXPANDER AND PLACEMENT OF IMPLANT;  Surgeon: Lowery Estefana RAMAN, DO;  Location: [facility];  Service: Plastics;  Laterality: Right;   SENTINEL NODE BIOPSY Right 12/21/2022   Procedure: SENTINEL NODE BIOPSY;  Surgeon: Curvin Deward MOULD, MD;  Location: [facility] OR;  Service: General;  Laterality: Right;   TONSILLECTOMY     TOTAL MASTECTOMY Right 12/21/2022   Procedure: RIGHT MASTECTOMY;  Surgeon: Curvin Deward MOULD, MD;  Location: [facility];  Service: General;  Laterality: Right;  GEN & PEC BLOCK    ROS: Review of Systems Negative except as stated above  PHYSICAL EXAM: BP 134/76 (BP Location: Left Arm, Patient Position: Sitting, Cuff Size: Normal)   Pulse 62   Temp 98 F (36.7 C) (Oral)   Ht 5' 2 (1.575 m)   Wt 199 lb (90.3 kg)   SpO2 99%   BMI 36.40 kg/m   Wt Readings from Last 3 Encounters:  05/13/24 199 lb (90.3 kg)  01/22/24 206 lb 6.4 oz (93.6 kg)  11/14/23 208 lb (94.3 kg)    Physical Exam  General appearance - alert, well appearing, older AAF and in no distress Mental status -  normal mood, behavior, speech, dress, motor activity, and thought processes Neck - supple, no significant adenopathy. No thyroid  enlargement Chest - clear to auscultation, no wheezes, rales or rhonchi, symmetric air entry Heart - normal rate, regular rhythm, normal S1, S2, no murmurs, rubs, clicks or gallops Extremities - peripheral pulses normal, no pedal edema, no clubbing or cyanosis      Latest Ref Rng & Units 01/22/2024    1:50 PM 11/14/2023    8:50 AM 06/20/2023    1:44 PM  CMP  Glucose 70 - 99 mg/dL 896  91  94   BUN 8 - 23 mg/dL 12  9  12    Creatinine 0.44 - 1.00 mg/dL 8.97  9.13  9.09   Sodium 135 - 145 mmol/L 138  140  137   Potassium 3.5 - 5.1 mmol/L 4.3  4.0  4.0   Chloride 98 - 111 mmol/L 105  104  106   CO2 22 - 32 mmol/L 27  21  24    Calcium 8.9 - 10.3 mg/dL 9.8  89.9  9.9   Total Protein 6.5 - 8.1 g/dL 8.2   8.6   Total Bilirubin 0.0 - 1.2 mg/dL 0.4   0.5   Alkaline Phos 38 - 126 U/L 77   69   AST 15 - 41 U/L 14   16   ALT 0 - 44 U/L 6   8    Lipid Panel     Component Value Date/Time   CHOL 203 (H) 10/18/2022 1616   TRIG 84 10/18/2022 1616   HDL 84 10/18/2022 1616   CHOLHDL 2.4 10/18/2022 1616   LDLCALC 104 (H) 10/18/2022 1616    CBC    Component Value Date/Time   WBC 5.7 01/22/2024 1350   WBC 4.3 11/14/2023 0850   RBC 4.62 01/22/2024 1350   HGB 13.6 01/22/2024 1350   HGB 14.3 05/23/2022 1121   HCT 40.4 01/22/2024 1350   HCT 42.7 05/23/2022 1121   PLT 272 01/22/2024 1350   PLT 260 05/23/2022 1121   MCV 87.4 01/22/2024 1350   MCV 88 05/23/2022 1121   MCH 29.4 01/22/2024 1350   MCHC 33.7 01/22/2024 1350   RDW 12.3 01/22/2024 1350   RDW 12.6 05/23/2022 1121   LYMPHSABS 2.0 01/22/2024 1350  MONOABS 0.4 01/22/2024 1350   EOSABS 0.1 01/22/2024 1350   BASOSABS 0.0 01/22/2024 1350    ASSESSMENT AND PLAN:  Assessment and Plan Assessment & Plan Hypertension Blood pressure slightly elevated. Goal: maintain at 130/80 or lower. - Continue amlodipine   10 mg daily. - Check blood pressure at home once or twice a week. - Limit salt intake. - Report if readings consistently exceed 130/80.  History of right breast cancer, status post mastectomy, on anastrozole  Post-mastectomy with ongoing anastrozole  therapy. Recent left breast mammogram normal. - Continue anastrozole  therapy.  Thyroid  nodules under surveillance Thyroid  ultrasound showed stable nodules. Left lower lobe nodule unchanged in size. - Repeat thyroid  ultrasound in one year.  Obesity Weight decreased from 212 lbs to 199 lbs. Improved diet and exercise habits. - Encourage continuation of healthy eating and weight loss. - Encourage initiation of water aerobics at the [facility].  Need for Shingles Rxn given for her to get 1st vaccine at outside pharmacy  We had intended to give PCV 20 today but forgot.  I have sent her a MyChart message giving her the option of coming back to have it done by our nurse or she can get it at any outside pharmacy.    Patient was given the opportunity to ask questions.  Patient verbalized understanding of the plan and was able to repeat key elements of the plan.   This documentation was completed using Paediatric nurse.  Any transcriptional errors are unintentional.  No orders of the defined types were placed in this encounter.    Requested Prescriptions   Pending Prescriptions Disp Refills   amLODipine  (NORVASC ) 10 MG tablet 90 tablet 1    No follow-ups on file.  Barnie Louder, MD, FACP

## 2024-05-13 NOTE — Patient Instructions (Signed)
 VISIT SUMMARY:  Today, you came in for a follow-up appointment. We discussed your hypertension, history of breast cancer, thyroid  nodules, and weight management. You have made significant progress in your weight loss journey and have adopted healthier eating and exercise habits.  YOUR PLAN:  -HYPERTENSION: Hypertension means high blood pressure. Your blood pressure was slightly elevated last week. Continue taking amlodipine  10 mg daily, check your blood pressure at home once or twice a week, limit your salt intake, and report if your readings consistently exceed 130/80.  -HISTORY OF RIGHT BREAST CANCER, STATUS POST MASTECTOMY, ON ANASTROZOLE : You have a history of breast cancer and had a right mastectomy. You are currently on anastrozole  therapy, and your recent mammogram for the left breast was normal. Continue taking anastrozole  as prescribed.  -THYROID  NODULES UNDER SURVEILLANCE: Thyroid  nodules are lumps in the thyroid  gland. Your recent thyroid  ultrasound showed that the nodules are stable. We will repeat the thyroid  ultrasound in one year to monitor any changes.  -OBESITY: Obesity means having excess body fat. You have successfully lost weight, going from 212 lbs to 199 lbs, by improving your diet and exercise habits. Continue with your healthy eating, start water aerobics at the [facility], and try to incorporate more aerobic activities like walking.  INSTRUCTIONS:  Please follow up with us  if your blood pressure readings consistently exceed 130/80. We will repeat your thyroid  ultrasound in one year to monitor the nodules.

## 2024-05-22 ENCOUNTER — Other Ambulatory Visit: Payer: Self-pay | Admitting: Internal Medicine

## 2024-05-22 DIAGNOSIS — I10 Essential (primary) hypertension: Secondary | ICD-10-CM

## 2024-06-12 ENCOUNTER — Other Ambulatory Visit: Payer: Medicare HMO

## 2024-06-18 ENCOUNTER — Ambulatory Visit: Admitting: Plastic Surgery

## 2024-06-18 ENCOUNTER — Encounter: Payer: Self-pay | Admitting: Plastic Surgery

## 2024-06-18 VITALS — BP 127/76 | HR 71 | Ht 62.0 in | Wt 200.4 lb

## 2024-06-18 DIAGNOSIS — Z08 Encounter for follow-up examination after completed treatment for malignant neoplasm: Secondary | ICD-10-CM | POA: Diagnosis not present

## 2024-06-18 DIAGNOSIS — Z853 Personal history of malignant neoplasm of breast: Secondary | ICD-10-CM

## 2024-06-18 DIAGNOSIS — Z9011 Acquired absence of right breast and nipple: Secondary | ICD-10-CM | POA: Diagnosis not present

## 2024-06-18 DIAGNOSIS — D0511 Intraductal carcinoma in situ of right breast: Secondary | ICD-10-CM

## 2024-06-18 NOTE — Progress Notes (Signed)
 Patient ID: Janet Fowler, female    DOB: 1951-01-08, 73 y.o.   MRN: [id number]   Chief Complaint  Patient presents with   Follow-up    The patient is a 73 year old female here for follow-up on her breast reconstruction.  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  At the time she is 5 feet 2 inches tall and 200 pounds.  She was not a smoker and is not now.  She does not have diabetes.  She underwent a right mastectomy and a left reduction.  The first surgery was March 2024 mastopexy.  She then had the implant placed which is a 700 cc Mentor smooth round ultra high-profile gel implant on the right side and the left breast was reduced 500 g.  In February 2025 she had excision of some excess breast tissue with some lipo filling of the right breast.  Today she is doing really well.  There are no areas of concern on the left breast.  On the right breast she has a little fat necrosis on the medial aspect.  The patient says it does not hurt and it has not been bothering her at all.  She would like to just watch it for now.  She does have a mammogram scheduled for the left breast in January.    Review of Systems  Constitutional: Negative.   HENT: Negative.    Eyes: Negative.   Respiratory: Negative.    Cardiovascular: Negative.   Gastrointestinal: Negative.   Genitourinary: Negative.   Musculoskeletal: Negative.     Past Medical History:  Diagnosis Date   Breast cancer (HCC) 12/2022   right breast DCIS   Hypertension     Past Surgical History:  Procedure Laterality Date   BIOPSY THYROID  Bilateral 2024   BREAST BIOPSY Right 10/30/2022   MM RT BREAST BX W LOC DEV 1ST LESION IMAGE BX SPEC STEREO GUIDE 10/30/2022 [facility]   BREAST BIOPSY Right 10/30/2022   MM RT BREAST BX W LOC DEV EA AD LESION IMG BX SPEC STEREO GUIDE 10/30/2022 [facility]   BREAST RECONSTRUCTION WITH PLACEMENT OF TISSUE EXPANDER AND FLEX HD (ACELLULAR  HYDRATED DERMIS) Right 12/21/2022   Procedure: IMMEDIATE RIGHT BREAST RECONSTRUCTION WITH PLACEMENT OF TISSUE EXPANDER AND FLEX HD (ACELLULAR HYDRATED DERMIS);  Surgeon: Lowery Estefana RAMAN, DO;  Location: [facility];  Service: Plastics;  Laterality: Right;   BREAST REDUCTION SURGERY Left 04/26/2023   Procedure: MAMMARY REDUCTION LEFT BREAST;  Surgeon: Lowery Estefana RAMAN, DO;  Location: [facility];  Service: Plastics;  Laterality: Left;   COLONOSCOPY  2022   LIPOSUCTION Bilateral 04/26/2023   Procedure: LIPOSUCTION BILATERAL BREAST;  Surgeon: Lowery Estefana RAMAN, DO;  Location: [facility];  Service: Plastics;  Laterality: Bilateral;   LIPOSUCTION WITH LIPOFILLING Right 11/14/2023   Procedure: LIPOSUCTION WITH LIPOFILLING;  Surgeon: Lowery Estefana RAMAN, DO;  Location: [facility];  Service: Plastics;  Laterality: Right;   REMOVAL OF TISSUE EXPANDER AND PLACEMENT OF IMPLANT Right 04/26/2023   Procedure: REMOVAL OF RIGHT BREAST TISSUE EXPANDER AND PLACEMENT OF IMPLANT;  Surgeon: Lowery Estefana RAMAN, DO;  Location: [facility];  Service: Plastics;  Laterality: Right;   SENTINEL NODE BIOPSY Right 12/21/2022   Procedure: SENTINEL NODE BIOPSY;  Surgeon: Curvin Deward MOULD, MD;  Location: [facility];  Service: General;  Laterality: Right;   TONSILLECTOMY     TOTAL MASTECTOMY Right 12/21/2022   Procedure: RIGHT  MASTECTOMY;  Surgeon: Curvin Mt III, MD;  Location: [facility];  Service: General;  Laterality: Right;  GEN & PEC BLOCK      Current Outpatient Medications:    amLODipine  (NORVASC ) 10 MG tablet, TAKE 1 TABLET(10 MG) BY MOUTH DAILY, Disp: 90 tablet, Rfl: 1   anastrozole  (ARIMIDEX ) 1 MG tablet, TAKE 1 TABLET(1 MG) BY MOUTH DAILY, Disp: 30 tablet, Rfl: 1   Objective:   Vitals:   06/18/24 1300  BP: 127/76  Pulse: 71  SpO2: 98%    Physical Exam Vitals reviewed.  Constitutional:      Appearance: Normal appearance.  HENT:     Head: Atraumatic.  Cardiovascular:      Rate and Rhythm: Normal rate.     Pulses: Normal pulses.  Pulmonary:     Effort: Pulmonary effort is normal.  Abdominal:     Palpations: Abdomen is soft.  Neurological:     Mental Status: She is alert and oriented to person, place, and time.  Psychiatric:        Mood and Affect: Mood normal.        Behavior: Behavior normal.        Thought Content: Thought content normal.        Judgment: Judgment normal.     Assessment & Plan:  Acquired absence of right breast  Ductal carcinoma in situ (DCIS) of right breast  Plan to continue to monitor the right breast.  Encouraged the patient to go for her mammogram for the left breast.  Will see her back in 1 year.  If there is any questions or concerns we can certainly do an ultrasound of the right breast.  Estefana RAMAN Jaycion Treml, DO

## 2024-06-19 ENCOUNTER — Other Ambulatory Visit: Payer: Self-pay | Admitting: Hematology

## 2024-06-20 ENCOUNTER — Ambulatory Visit: Admitting: Plastic Surgery

## 2024-06-25 ENCOUNTER — Ambulatory Visit: Admitting: Surgical

## 2024-06-25 ENCOUNTER — Telehealth: Payer: Self-pay

## 2024-06-25 DIAGNOSIS — Z853 Personal history of malignant neoplasm of breast: Secondary | ICD-10-CM | POA: Diagnosis not present

## 2024-06-25 DIAGNOSIS — Z9011 Acquired absence of right breast and nipple: Secondary | ICD-10-CM

## 2024-06-25 DIAGNOSIS — Z9889 Other specified postprocedural states: Secondary | ICD-10-CM

## 2024-06-25 DIAGNOSIS — D0511 Intraductal carcinoma in situ of right breast: Secondary | ICD-10-CM

## 2024-06-25 DIAGNOSIS — N651 Disproportion of reconstructed breast: Secondary | ICD-10-CM | POA: Diagnosis not present

## 2024-06-25 NOTE — Progress Notes (Signed)
   Referring Provider Vicci Barnie NOVAK, MD [address] [place],  [place] [address]   CC: No chief complaint on file.     Janet Fowler is an 73 y.o. female.  HPI: Patient is a very pleasant 73 year old female here for consultation to discuss nipple areola tattoo.  She had a history of right breast cancer, underwent right mastectomy with implant-based reconstruction.  She has a 700 cc Mentor smooth round ultrahigh profile gel implant on the right side.  She also had a left breast reduction for improved symmetry.  She most recently underwent excision of excess breast skin and fat grafting to the right breast February 2025.  She is here to discuss nipple areolar tattoo on the right side.  She reports she is overall happy with her reconstruction, she does have some residual excess skin on the right side.  She is not having any infectious symptoms or concerns.  She does not have any history of tattoos.  Physical Exam    06/18/2024    1:00 PM 05/13/2024    2:44 PM 01/22/2024    2:19 PM  Vitals with BMI  Height 5' 2 5' 2 5' 2  Weight 200 lbs 6 oz 199 lbs 206 lbs 6 oz  BMI 36.64 36.39 37.74  Systolic 127 134 861  Diastolic 76 76 62  Pulse 71 62 90    General:  No acute distress,  Alert and oriented, Non-Toxic, Normal speech and affect Breast: Left breast reduction with some hypertrophic scarring noted along the left NAC.  No wounds noted. Right breast reconstruction with implant in place, implant is soft.  Some mild fat necrosis noted along the medial aspect.  No overlying skin changes.  No tenderness noted the right breast.  No capsular contracture noted.  She does have some excess skin noted.  Breasts are asymmetric  Assessment/Plan Patient is a very pleasant 73 year old female here for consultation to discuss tattooing to the right nipple areola after breast reconstruction.  Patient and I had a thorough conversation about nipple areola tattoo procedure.  We discussed  risks including risk of infection, risk of allergic reaction, need for additional procedures in the future, and fading of the tattoo.  We reviewed nipple areola tattoo colors today, we discussed that we would do this once again at the day of her procedure.   We did also discuss that due to her having some excess skin, if she were to decide that she would like the excess skin removed, this could alter the location, size of the tattoo.  I encouraged her to think about this more and if she did decide that that is something she would like to do prior to tattooing I think it would be reasonable to rediscuss with Dr. Lowery.    Discussed with patient we will submit to insurance for tattoo of the right nipple areola, once approved, we will schedule her for initial tattoo.  We discussed that it may take 2-3 tattoo sessions for completed tattoo.   Donnice PARAS Jmya Uliano 06/25/2024, 1:38 PM

## 2024-06-25 NOTE — Telephone Encounter (Signed)
 error

## 2024-07-01 ENCOUNTER — Ambulatory Visit: Admitting: Surgical

## 2024-07-02 ENCOUNTER — Ambulatory Visit (HOSPITAL_BASED_OUTPATIENT_CLINIC_OR_DEPARTMENT_OTHER)
Admission: RE | Admit: 2024-07-02 | Discharge: 2024-07-02 | Disposition: A | Discharge status: Home / Self Care | Source: Ambulatory Visit | Attending: Nurse Practitioner | Admitting: Nurse Practitioner

## 2024-07-02 DIAGNOSIS — Z780 Asymptomatic menopausal state: Secondary | ICD-10-CM | POA: Diagnosis not present

## 2024-07-02 DIAGNOSIS — Z13820 Encounter for screening for osteoporosis: Secondary | ICD-10-CM | POA: Insufficient documentation

## 2024-07-02 DIAGNOSIS — D0511 Intraductal carcinoma in situ of right breast: Secondary | ICD-10-CM | POA: Diagnosis not present

## 2024-07-02 DIAGNOSIS — M8589 Other specified disorders of bone density and structure, multiple sites: Secondary | ICD-10-CM | POA: Diagnosis not present

## 2024-07-02 DIAGNOSIS — M85852 Other specified disorders of bone density and structure, left thigh: Secondary | ICD-10-CM | POA: Diagnosis not present

## 2024-07-08 ENCOUNTER — Ambulatory Visit

## 2024-07-15 ENCOUNTER — Other Ambulatory Visit: Payer: Self-pay | Admitting: Nurse Practitioner

## 2024-07-15 DIAGNOSIS — D0511 Intraductal carcinoma in situ of right breast: Secondary | ICD-10-CM

## 2024-07-15 NOTE — Progress Notes (Signed)
 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 -she underwent a right breast mastectomy on first 2024, which showed high-grade DCIS, solid type with necrosis and calcifications.  Negative for invasive carcinoma.  Margins were negative. -We discussed moderate risk of future breast cancer in left breast, I recommended low-dose tamoxifen  5 mg daily for 3 years, she started in 06/2023 but stopped due to leg cramps. She was switched to anastrozole  and has tolerated well thus far.   History of DCIS of right breast Currently on anastrozole  daily.  She has noted some muscle cramps, hot flashes, and night sweats.  Symptoms are manageable.  Reviewed left screening mammogram done 10/24/2023 which was benign.  A new 3D screening mammogram for the left breast ordered as part of today's visit.  Continue anastrozole  daily and breast cancer surveillance.  Osteopenia DEXA scan done on 07/02/2024 does show evidence of osteopenia/low bone mass.  Recommended daily dose of calcium and vitamin D.  Encouraged her to stay active, especially low impact exercises.  Repeat study in 2 years.    Plan Labs reviewed. -CBC and CMP are unremarkable. 3D screening mammogram for left breast 2 on or after 10/23/2024.  This was ordered as part of today's visit. Continue anastrozole  daily. Plan for labs and follow-up in 6 months, sooner if needed. The patient understands  the plans discussed today and is in agreement with them.  She knows to contact our office if she develops concerns prior to her next appointment.  I provided 25 minutes of face-to-face time during this encounter and > 50% was spent counseling as documented under my assessment and plan.    Powell FORBES Lessen, NP  [facility] - A DEPT OF [facility] [address] [place] [address] Dept: [phone] Dept Fax: [phone]   Orders Placed This Encounter  Procedures   MM 3D SCREENING MAMMOGRAM UNILATERAL LEFT BREAST    Standing Status:   Future    Expected Date:   10/23/2024    Expiration Date:   07/31/2025    Reason for Exam (SYMPTOM  OR DIAGNOSIS REQUIRED):   history of right breast cancer with mastectomy    Preferred imaging location?:   [facility]      CHIEF COMPLAINT:  CC: DCIS right breast, ER +  Current Treatment: Anastrozole    INTERVAL HISTORY:  Titania is here today for repeat clinical assessment. She last saw Dr. Lanny 01/22/2024. She had BMD on 07/02/2024 which did show osteopenia. Left screening mammogram due after 10/23/2024.  She continues to tolerate anastrozole  relatively well.  She has had some muscle cramps, hot flashes, night sweats.  Symptoms are manageable.  She has noted no changes in the left breast.  She denies chest pain, chest pressure, or shortness of breath. She denies headaches or visual disturbances. She denies abdominal pain, nausea, vomiting, or changes in bowel or bladder habits.  She denies fevers or chills. She denies pain. Her appetite  is good. Her weight has been stable.  I have reviewed the past medical history, past surgical history, social history and family history with the patient and they are unchanged from previous note.  ALLERGIES:  is allergic to steri-strip compound benzoin [benzoin].  MEDICATIONS:  Current Outpatient Medications  Medication Sig Dispense Refill    amLODipine  (NORVASC ) 10 MG tablet TAKE 1 TABLET(10 MG) BY MOUTH DAILY 90 tablet 1   anastrozole  (ARIMIDEX ) 1 MG tablet TAKE 1 TABLET(1 MG) BY MOUTH DAILY 30 tablet 1   No current facility-administered medications for this visit.    HISTORY OF PRESENT ILLNESS:   Oncology History Overview Note   Cancer Staging  Ductal carcinoma in situ (DCIS) of right breast Staging form: Breast, AJCC 8th Edition - Clinical stage from 10/30/2022: Stage 0 (cTis (DCIS), cN0, cM0, G3, ER+, PR+, HER2: Not Assessed) - Signed by Lanny Callander, MD on 11/07/2022 Stage prefix: Initial diagnosis Histologic grading system: 3 grade system     Ductal carcinoma in situ (DCIS) of right breast  10/30/2022 Cancer Staging   Staging form: Breast, AJCC 8th Edition - Clinical stage from 10/30/2022: Stage 0 (cTis (DCIS), cN0, cM0, G3, ER+, PR+, HER2: Not Assessed) - Signed by Lanny Callander, MD on 11/07/2022 Stage prefix: Initial diagnosis Histologic grading system: 3 grade system   11/06/2022 Initial Diagnosis   Ductal carcinoma in situ (DCIS) of right breast   12/21/2022 Pathology Results    FINAL MICROSCOPIC DIAGNOSIS:  A. RIGHT BREAST, MASTECTOMY: High-grade ductal carcinoma in situ, solid type with necrosis and calcifications, two foci Negative for invasive carcinoma DCIS involves 5 of 14 submitted blocks (excluding the benign nipple) Margins free (DCIS 0.75 mm from the inferior margin) Changes consistent with prior biopsies (coil clip and X clip) Prognostic markers (from report [id number]): Estrogen receptor positive, progesterone receptor positive  B. RIGHT AXILLARY SENTINEL LYMPH NODE #1, EXCISION: One benign lymph node, negative for carcinoma (0/1)  C. RIGHT AXILLARY SENTINEL LYMPH NODE, EXCISION: One benign lymph node with lipogranulomata, negative for carcinoma (0/1)  D. RIGHT AXILLARY SENTINEL LYMPH NODE, EXCISION: One benign lymph node, negative for carcinoma (0/1)  E. RIGHT AXILLARY SENTINEL LYMPH NODE,  EXCISION: One benign lymph node, negative for carcinoma (0/1)  F. RIGHT AXILLARY SENTINEL LYMPH NODE #2, EXCISION: One benign lymph node with lipogranulomata, negative for carcinoma (0/1)  G. RIGHT AXILLARY SENTINEL LYMPH NODE, EXCISION: One benign lymph node with lipogranulomata, negative for carcinoma (0/1)  H. RIGHT AXILLARY SENTINEL LYMPH NODE, EXCISION: One benign lymph node, negative for carcinoma (0/1)  ONCOLOGY TABLE:  DCIS OF THE BREAST:  Resection Procedure: Lumpectomy Specimen Laterality: Right Histologic Type: Ductal carcinoma in situ Size of DCIS: DCIS involves 5 of 14 submitted blocks (excluding the nipple) Nuclear Grade: High-grade Necrosis: Present Margins: All margins negative for DCIS      Specify Closest Margin (required only if <43mm): DCIS 0.75 mm from the inferior margin Regional Lymph Nodes:      Number of Lymph Nodes Examined: 7      Number of Sentinel Nodes Examined (if applicable): 7      Number of Lymph Nodes with Macrometastases: 0      Number of Lymph Nodes with Micrometastases): 0      Number of Lymph Nodes with Isolated Tumor Cells (=0.2 mm or =200 cells): 0 Breast Biomarker Testing Performed on Previous Biopsy:      Testing performed on Case Number: [id number]      Estrogen Receptor: Positive (95%, strong staining)  Progesterone Receptor: Positive (30%, strong staining) Pathologic Stage Classification (pTNM, AJCC 8th Edition): pTis, pN0 Representative Tumor Block: A2, A7 Comment(s): (v4.4.0.0)    12/21/2022 Cancer Staging   Staging form: Breast, AJCC 8th Edition - Pathologic stage from 12/21/2022: Stage 0 (pTis (DCIS), pN0, cM0, G3, ER+, PR+, HER2: Not Assessed) - Signed by Lanny Callander, MD on 01/17/2023 Histologic grading system: 3 grade system Residual tumor (R): R0 - None       REVIEW OF SYSTEMS:   Constitutional: Denies fevers, chills or abnormal weight loss Eyes: Denies blurriness of vision Ears, nose, mouth, throat, and face:  Denies mucositis or sore throat Respiratory: Denies cough, dyspnea or wheezes Cardiovascular: Denies palpitation, chest discomfort or lower extremity swelling Gastrointestinal:  Denies nausea, heartburn or change in bowel habits Skin: Denies abnormal skin rashes Lymphatics: Denies new lymphadenopathy or easy bruising Neurological:Denies numbness, tingling or new weaknesses Behavioral/Psych: Mood is stable, no new changes  All other systems were reviewed with the patient and are negative.   VITALS:   Today's Vitals   07/16/24 1317 07/16/24 1331  BP: 138/70   Pulse: 63   Resp: 17   Temp: 97.8 F (36.6 C)   SpO2: 99%   Weight: 200 lb (90.7 kg)   PainSc:  0-No pain   Body mass index is 36.58 kg/m.   Wt Readings from Last 3 Encounters:  07/16/24 200 lb (90.7 kg)  06/18/24 200 lb 6.4 oz (90.9 kg)  05/13/24 199 lb (90.3 kg)    Body mass index is 36.58 kg/m.  Performance status (ECOG): 1 - Symptomatic but completely ambulatory  PHYSICAL EXAM:   GENERAL:alert, no distress and comfortable SKIN: skin color, texture, turgor are normal, no rashes or significant lesions EYES: normal, Conjunctiva are pink and non-injected, sclera clear OROPHARYNX:no exudate, no erythema and lips, buccal mucosa, and tongue normal  NECK: supple, thyroid  normal size, non-tender, without nodularity LYMPH:  no palpable lymphadenopathy in the cervical, axillary or inguinal LUNGS: clear to auscultation and percussion with normal breathing effort HEART: regular rate & rhythm and no murmurs and no lower extremity edema ABDOMEN:abdomen soft, non-tender and normal bowel sounds Musculoskeletal:no cyanosis of digits and no clubbing  NEURO: alert & oriented x 3 with fluent speech, no focal motor/sensory deficits BREAST: The right breast is surgically absent.  There are no palpable masses or lumps along the chest wall.  There is no axillary lymphadenopathy on the right side.  The left breast contains no palpable  masses or lumps today.  There is no nipple inversion or nipple discharge.  There is no axillary lymphadenopathy on the left.  LABORATORY DATA:  I have reviewed the data as listed    Component Value Date/Time   NA 138 07/16/2024 1257   NA 138 10/18/2022 1616   K 4.2 07/16/2024 1257   CL 105 07/16/2024 1257   CO2 25 07/16/2024 1257   GLUCOSE 83 07/16/2024 1257   BUN 12 07/16/2024 1257   BUN 12 10/18/2022 1616   CREATININE 0.94 07/16/2024 1257   CALCIUM 10.4 (H) 07/16/2024 1257   PROT 8.3 (H) 07/16/2024 1257   PROT 7.6 05/23/2022 1121   ALBUMIN 4.6 07/16/2024 1257   ALBUMIN 4.7 05/23/2022 1121   AST 15 07/16/2024 1257   ALT 6 07/16/2024 1257   ALKPHOS 83 07/16/2024 1257   BILITOT 0.5 07/16/2024 1257   GFRNONAA >60 07/16/2024 1257    Lab Results  Component Value Date   WBC 5.2 07/16/2024   NEUTROABS 2.6 07/16/2024  HGB 14.5 07/16/2024   HCT 41.6 07/16/2024   MCV 84.9 07/16/2024   PLT 267 07/16/2024     RADIOGRAPHIC STUDIES: DG Bone Density Result Date: 07/02/2024 EXAM: DUAL X-RAY ABSORPTIOMETRY (DXA) FOR BONE MINERAL DENSITY 07/02/2024 2:12 pm CLINICAL DATA:  73 year old Female Postmenopausal. Baseline for anti-estrogen therapy TECHNIQUE: An axial (e.g., hips, spine) and/or appendicular (e.g., radius) exam was performed, as appropriate, using GE Secretary/administrator at [employer]. Images are obtained for bone mineral density measurement and are not obtained for diagnostic purposes. [id number] Exclusions: None. COMPARISON:  None. FINDINGS: Scan quality: Good. LUMBAR SPINE (L1-L4): BMD (in g/cm2): 1.065 T-score: -1.0 Z-score: -0.9 LEFT FEMORAL NECK: BMD (in g/cm2): 0.888 T-score: -1.1 Z-score: -0.6 LEFT TOTAL HIP: BMD (in g/cm2): 0.948 T-score: -0.5 Z-score: -0.3 RIGHT FEMORAL NECK: BMD (in g/cm2): 0.936 T-score: -0.7 Z-score: -0.3 RIGHT TOTAL HIP: BMD (in g/cm2): 0.952 T-score: -0.4 Z-score: -0.3 FRAX 10-YEAR PROBABILITY OF FRACTURE: 10-year fracture  risk is performed using the [employer] FRAX calculator based on patient-reported risk factors. Major osteoporotic fracture: 4.0% Hip fracture: 0.6% Other situations known to alter the reliability of the FRAX score should be considered when making treatment decisions, including chronic glucocorticoid use and past treatments. Further guidance on treatment can be found at the [facility] Foundation's website https://www.patton.com/. IMPRESSION: Osteopenia based on BMD. Fracture risk is increased. Increased risk is based on low BMD. RECOMMENDATIONS: 1. All patients should optimize calcium and vitamin D intake. 2. Consider FDA-approved medical therapies in postmenopausal women and men aged 60 years and older, based on the following: - A hip or vertebral (clinical or morphometric) fracture - T-score less than or equal to -2.5 and secondary causes have been excluded. - Low bone mass (T-score between -1.0 and -2.5) and a 10-year probability of a hip fracture greater than or equal to 3% or a 10-year probability of a major osteoporosis-related fracture greater than or equal to 20% based on the [place] -adapted WHO algorithm. - Clinician judgment and/or patient preferences may indicate treatment for people with 10-year fracture probabilities above or below these levels 3. Patients with diagnosis of osteoporosis or at high risk for fracture should have regular bone mineral density tests. For patients eligible for Medicare, routine testing is allowed once every 2 years. The testing frequency can be increased to one year for patients who have rapidly progressing disease, those who are receiving or discontinuing medical therapy to restore bone mass, or have additional risk factors. Electronically Signed   By: Dina  Arceo M.D.   On: 07/02/2024 15:55

## 2024-07-15 NOTE — Assessment & Plan Note (Addendum)
-  High grade, ER+/PR+ --Diagnosed in January 2024, discovered on screening mammogram. -she underwent a right breast mastectomy on first 2024, which showed high-grade DCIS, solid type with necrosis and calcifications.  Negative for invasive carcinoma.  Margins were negative. -We discussed moderate risk of future breast cancer in left breast. Low-dose tamoxifen  5 mg daily for 3 years was recommended. she started in 06/2023 but stopped due to leg cramps. She was switched to anastrozole  and has tolerated well thus far. - 3D screening mammogram of left breast in January 2025 was benign.  Repeat study ordered for January 2026. -Continue anastrozole  daily. -Plan for labs and follow-up in 6 months, sooner.

## 2024-07-16 ENCOUNTER — Inpatient Hospital Stay (HOSPITAL_BASED_OUTPATIENT_CLINIC_OR_DEPARTMENT_OTHER): Admitting: Nurse Practitioner

## 2024-07-16 ENCOUNTER — Inpatient Hospital Stay: Attending: Nurse Practitioner

## 2024-07-16 VITALS — BP 138/70 | HR 63 | Temp 97.8°F | Resp 17 | Wt 200.0 lb

## 2024-07-16 DIAGNOSIS — M8580 Other specified disorders of bone density and structure, unspecified site: Secondary | ICD-10-CM | POA: Insufficient documentation

## 2024-07-16 DIAGNOSIS — R252 Cramp and spasm: Secondary | ICD-10-CM | POA: Insufficient documentation

## 2024-07-16 DIAGNOSIS — R232 Flushing: Secondary | ICD-10-CM | POA: Insufficient documentation

## 2024-07-16 DIAGNOSIS — Z79811 Long term (current) use of aromatase inhibitors: Secondary | ICD-10-CM | POA: Insufficient documentation

## 2024-07-16 DIAGNOSIS — Z1721 Progesterone receptor positive status: Secondary | ICD-10-CM | POA: Diagnosis not present

## 2024-07-16 DIAGNOSIS — D0511 Intraductal carcinoma in situ of right breast: Secondary | ICD-10-CM | POA: Diagnosis not present

## 2024-07-16 DIAGNOSIS — Z170 Estrogen receptor positive status [ER+]: Secondary | ICD-10-CM | POA: Insufficient documentation

## 2024-07-16 DIAGNOSIS — Z79899 Other long term (current) drug therapy: Secondary | ICD-10-CM | POA: Insufficient documentation

## 2024-07-16 DIAGNOSIS — R61 Generalized hyperhidrosis: Secondary | ICD-10-CM | POA: Diagnosis not present

## 2024-07-16 LAB — CBC WITH DIFFERENTIAL (CANCER CENTER ONLY)
Abs Immature Granulocytes: 0.01 K/uL (ref 0.00–0.07)
Basophils Absolute: 0 K/uL (ref 0.0–0.1)
Basophils Relative: 1 %
Eosinophils Absolute: 0.2 K/uL (ref 0.0–0.5)
Eosinophils Relative: 3 %
HCT: 41.6 % (ref 36.0–46.0)
Hemoglobin: 14.5 g/dL (ref 12.0–15.0)
Immature Granulocytes: 0 %
Lymphocytes Relative: 38 %
Lymphs Abs: 2 K/uL (ref 0.7–4.0)
MCH: 29.6 pg (ref 26.0–34.0)
MCHC: 34.9 g/dL (ref 30.0–36.0)
MCV: 84.9 fL (ref 80.0–100.0)
Monocytes Absolute: 0.4 K/uL (ref 0.1–1.0)
Monocytes Relative: 8 %
Neutro Abs: 2.6 K/uL (ref 1.7–7.7)
Neutrophils Relative %: 50 %
Platelet Count: 267 K/uL (ref 150–400)
RBC: 4.9 MIL/uL (ref 3.87–5.11)
RDW: 12.1 % (ref 11.5–15.5)
WBC Count: 5.2 K/uL (ref 4.0–10.5)
nRBC: 0 % (ref 0.0–0.2)

## 2024-07-16 LAB — CMP (CANCER CENTER ONLY)
ALT: 6 U/L (ref 0–44)
AST: 15 U/L (ref 15–41)
Albumin: 4.6 g/dL (ref 3.5–5.0)
Alkaline Phosphatase: 83 U/L (ref 38–126)
Anion gap: 8 (ref 5–15)
BUN: 12 mg/dL (ref 8–23)
CO2: 25 mmol/L (ref 22–32)
Calcium: 10.4 mg/dL — ABNORMAL HIGH (ref 8.9–10.3)
Chloride: 105 mmol/L (ref 98–111)
Creatinine: 0.94 mg/dL (ref 0.44–1.00)
GFR, Estimated: >60 mL/min (ref >60)
Glucose, Bld: 83 mg/dL (ref 70–99)
Potassium: 4.2 mmol/L (ref 3.5–5.1)
Sodium: 138 mmol/L (ref 135–145)
Total Bilirubin: 0.5 mg/dL (ref 0.0–1.2)
Total Protein: 8.3 g/dL — ABNORMAL HIGH (ref 6.5–8.1)

## 2024-07-18 ENCOUNTER — Ambulatory Visit: Admitting: Surgical

## 2024-07-25 ENCOUNTER — Ambulatory Visit: Admitting: Surgical

## 2024-07-31 ENCOUNTER — Encounter: Payer: Self-pay | Admitting: Nurse Practitioner

## 2024-08-19 ENCOUNTER — Other Ambulatory Visit: Payer: Self-pay | Admitting: Hematology

## 2024-09-12 ENCOUNTER — Ambulatory Visit: Admitting: Internal Medicine

## 2024-09-21 ENCOUNTER — Other Ambulatory Visit: Payer: Self-pay | Admitting: Hematology

## 2024-10-21 ENCOUNTER — Ambulatory Visit: Attending: Internal Medicine

## 2024-10-21 VITALS — Ht 62.0 in | Wt 200.0 lb

## 2024-10-21 DIAGNOSIS — Z0000 Encounter for general adult medical examination without abnormal findings: Secondary | ICD-10-CM

## 2024-10-21 NOTE — Progress Notes (Signed)
 "  Chief Complaint  Patient presents with   Medicare Wellness    Subsequent     Subjective:   Janet Fowler is a 74 y.o. female who presents for a Medicare Annual Wellness Visit.  Visit info / Clinical Intake: Medicare Wellness Visit Type:: Subsequent Annual Wellness Visit Persons participating in visit and providing information:: patient Medicare Wellness Visit Mode:: Telephone If telephone:: video declined Since this visit was completed virtually, some vitals may be partially provided or unavailable. Missing vitals are due to the limitations of the virtual format.: Documented vitals are patient reported If Telephone or Video please confirm:: I connected with patient using audio/video enable telemedicine. I verified patient identity with two identifiers, discussed telehealth limitations, and patient agreed to proceed. Patient Location:: home Provider Location:: office Interpreter Needed?: No Pre-visit prep was completed: yes AWV questionnaire completed by patient prior to visit?: yes Date:: 10/17/24 Living arrangements:: (!) lives alone Patient's Overall Health Status Rating: very good Typical amount of pain: none Does pain affect daily life?: no Are you currently prescribed opioids?: no  Dietary Habits and Nutritional Risks How many meals a day?: 2 Eats fruit and vegetables daily?: yes Most meals are obtained by: preparing own meals In the last 2 weeks, have you had any of the following?: none Diabetic:: no  Functional Status Activities of Daily Living (to include ambulation/medication): Independent Ambulation: Independent Medication Administration: Independent Home Management (perform basic housework or laundry): Independent Manage your own finances?: yes Primary transportation is: driving Concerns about vision?: no *vision screening is required for WTM* Concerns about hearing?: no  Fall Screening Falls in the past year?: 0 Number of falls in past year: 0 Was  there an injury with Fall?: 0 Fall Risk Category Calculator: 0 Patient Fall Risk Level: Low Fall Risk  Fall Risk Patient at Risk for Falls Due to: No Fall Risks Fall risk Follow up: Falls evaluation completed; Education provided  Home and Transportation Safety: All rugs have non-skid backing?: yes All stairs or steps have railings?: N/A, no stairs Grab bars in the bathtub or shower?: yes Have non-skid surface in bathtub or shower?: yes Good home lighting?: yes Regular seat belt use?: yes Hospital stays in the last year:: no  Cognitive Assessment Difficulty concentrating, remembering, or making decisions? : no (BSE: puzzles, games) Will 6CIT or Mini Cog be Completed: yes What year is it?: 0 points What month is it?: 0 points Give patient an address phrase to remember (5 components): [address] [place] About what time is it?: 0 points Count backwards from 20 to 1: 0 points Say the months of the year in reverse: 0 points Repeat the address phrase from earlier: 0 points 6 CIT Score: 0 points  Advance Directives (For Healthcare) Does Patient Have a Medical Advance Directive?: No Would patient like information on creating a medical advance directive?: No - Patient declined  Reviewed/Updated  Reviewed/Updated: Reviewed All (Medical, Surgical, Family, Medications, Allergies, Care Teams, Patient Goals)    Allergies (verified) Steri-strip compound benzoin [benzoin]   Current Medications (verified) Outpatient Encounter Medications as of 10/21/2024  Medication Sig   amLODipine  (NORVASC ) 10 MG tablet TAKE 1 TABLET(10 MG) BY MOUTH DAILY   anastrozole  (ARIMIDEX ) 1 MG tablet TAKE 1 TABLET(1 MG) BY MOUTH DAILY   No facility-administered encounter medications on file as of 10/21/2024.    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   Hypertension    Past Surgical  History:  Procedure Laterality Date   BIOPSY THYROID   Bilateral 2024   BREAST BIOPSY Right 10/30/2022   MM RT BREAST BX W LOC DEV 1ST LESION IMAGE BX SPEC STEREO GUIDE 10/30/2022 [facility]   BREAST BIOPSY Right 10/30/2022   MM RT BREAST BX W LOC DEV EA AD LESION IMG BX SPEC STEREO GUIDE 10/30/2022 [facility]   BREAST RECONSTRUCTION WITH PLACEMENT OF TISSUE EXPANDER AND FLEX HD (ACELLULAR HYDRATED DERMIS) Right 12/21/2022   Procedure: IMMEDIATE RIGHT BREAST RECONSTRUCTION WITH PLACEMENT OF TISSUE EXPANDER AND FLEX HD (ACELLULAR HYDRATED DERMIS);  Surgeon: Lowery Estefana RAMAN, DO;  Location: [facility];  Service: Plastics;  Laterality: Right;   BREAST REDUCTION SURGERY Left 04/26/2023   Procedure: MAMMARY REDUCTION LEFT BREAST;  Surgeon: Lowery Estefana RAMAN, DO;  Location: [facility];  Service: Plastics;  Laterality: Left;   COLONOSCOPY  2022   LIPOSUCTION Bilateral 04/26/2023   Procedure: LIPOSUCTION BILATERAL BREAST;  Surgeon: Lowery Estefana RAMAN, DO;  Location: [facility];  Service: Plastics;  Laterality: Bilateral;   LIPOSUCTION WITH LIPOFILLING Right 11/14/2023   Procedure: LIPOSUCTION WITH LIPOFILLING;  Surgeon: Lowery Estefana RAMAN, DO;  Location: [facility];  Service: Plastics;  Laterality: Right;   REMOVAL OF TISSUE EXPANDER AND PLACEMENT OF IMPLANT Right 04/26/2023   Procedure: REMOVAL OF RIGHT BREAST TISSUE EXPANDER AND PLACEMENT OF IMPLANT;  Surgeon: Lowery Estefana RAMAN, DO;  Location: [facility];  Service: Plastics;  Laterality: Right;   SENTINEL NODE BIOPSY Right 12/21/2022   Procedure: SENTINEL NODE BIOPSY;  Surgeon: Curvin Deward MOULD, MD;  Location: [facility] OR;  Service: General;  Laterality: Right;   TONSILLECTOMY     TOTAL MASTECTOMY Right 12/21/2022   Procedure: RIGHT MASTECTOMY;  Surgeon: Curvin Deward MOULD, MD;  Location: [facility] OR;  Service: General;  Laterality: Right;  GEN & PEC BLOCK   Family History  Problem Relation Age of Onset   Hypertension Mother    Arthritis Mother    Stroke  Father    Hypertension Father    Colon cancer Brother 6 - 51   Prostate cancer Brother 98 - 79   Prostate cancer Brother 28 - 91   Social History   Occupational History   Occupation: Retired diplomatic services operational officer  Tobacco Use   Smoking status: Never   Smokeless tobacco: Never  Vaping Use   Vaping status: Never Used  Substance and Sexual Activity   Alcohol use: Never   Drug use: Never   Sexual activity: Not Currently    Birth control/protection: None, Post-menopausal   Tobacco Counseling Counseling given: Not Answered  SDOH Screenings   Food Insecurity: No Food Insecurity (10/21/2024)  Housing: Low Risk (10/21/2024)  Transportation Needs: No Transportation Needs (10/21/2024)  Utilities: Not At Risk (10/21/2024)  Depression (PHQ2-9): Low Risk (10/21/2024)  Financial Resource Strain: Low Risk (10/17/2024)  Physical Activity: Inactive (10/21/2024)  Social Connections: Moderately Integrated (10/21/2024)  Stress: No Stress Concern Present (10/21/2024)  Tobacco Use: Low Risk (10/21/2024)  Health Literacy: Adequate Health Literacy (10/21/2024)   See flowsheets for full screening details  Depression Screen PHQ 2 & 9 Depression Scale- Over the past 2 weeks, how often have you been bothered by any of the following problems? Little interest or pleasure in doing things: 0 Feeling down, depressed, or hopeless (PHQ Adolescent also includes...irritable): 0 PHQ-2 Total Score: 0 Trouble falling or staying asleep, or sleeping too much: 0 Feeling tired or having little energy: 0 Poor appetite or overeating (PHQ Adolescent also includes...weight loss): 0  Feeling bad about yourself - or that you are a failure or have let yourself or your family down: 0 Trouble concentrating on things, such as reading the newspaper or watching television (PHQ Adolescent also includes...like school work): 0 Moving or speaking so slowly that other people could have noticed. Or the opposite - being so fidgety or restless that you  have been moving around a lot more than usual: 0 Thoughts that you would be better off dead, or of hurting yourself in some way: 0 PHQ-9 Total Score: 0 If you checked off any problems, how difficult have these problems made it for you to do your work, take care of things at home, or get along with other people?: Not difficult at all  Depression Treatment Depression Interventions/Treatment : [id number] Score <4 Follow-up Not Indicated     Goals Addressed             This Visit's Progress    10/21/2024: My goal for 2026 is to stay independent and increase my physical activity.               Objective:    Today's Vitals   10/21/24 1453  Weight: 200 lb (90.7 kg)  Height: 5' 2 (1.575 m)  PainSc: 2   PainLoc: Hand   Body mass index is 36.58 kg/m.  Hearing/Vision screen Vision Screening - Comments:: Patient wears eyeglasses and contacts as needed.  Immunizations and Health Maintenance Health Maintenance  Topic Date Due   Hepatitis C Screening  Never done   Pneumococcal Vaccine: 50+ Years (1 of 1 - PCV) Never done   Influenza Vaccine  05/02/2024   COVID-19 Vaccine (4 - 2025-26 season) 06/02/2024   Mammogram  10/22/2024   Zoster Vaccines- Shingrix (1 of 2) 11/13/2024 (Originally 09/18/1970)   Medicare Annual Wellness (AWV)  10/21/2025   Bone Density Scan  07/02/2026   Colonoscopy  10/04/2030   DTaP/Tdap/Td (2 - Td or Tdap) 09/19/2033   Meningococcal B Vaccine  Aged Out        Assessment/Plan:  This is a routine wellness examination for Janet Fowler.  Patient Care Team: Vicci Barnie NOVAK, MD as PCP - General (Internal Medicine) Curvin Deward MOULD, MD as Consulting Physician (General Surgery) Lanny Callander, MD as Consulting Physician (Hematology) Dewey Rush, MD as Consulting Physician (Radiation Oncology) Tyree Nanetta SAILOR, RN as Oncology Nurse Navigator Burton, Lacie K, NP as Nurse Practitioner (Nurse Practitioner) Claudene Nanetta, OD as Consulting Physician (Optometry)  I have  personally reviewed and noted the following in the patients chart:   Medical and social history Use of alcohol, tobacco or illicit drugs  Current medications and supplements including opioid prescriptions. Functional ability and status Nutritional status Physical activity Advanced directives List of other physicians Hospitalizations, surgeries, and ER visits in previous 12 months Vitals Screenings to include cognitive, depression, and falls Referrals and appointments  No orders of the defined types were placed in this encounter.  In addition, I have reviewed and discussed with patient certain preventive protocols, quality metrics, and best practice recommendations. A written personalized care plan for preventive services as well as general preventive health recommendations were provided to patient.   Roz SAILOR Fuller, LPN   [id number]/79/7973   Return in about 1 year (around 10/21/2025) for Medicare wellness.  After Visit Summary: (MyChart) Due to this being a telephonic visit, the after visit summary with patients personalized plan was offered to patient via MyChart   Nurse Notes:  No voiced or noted concerns at this time  HM Addressed: Vaccines Due: Flu, COVID-19, Pneumococcal and Hepatitis C Screening  "

## 2024-10-21 NOTE — Patient Instructions (Signed)
 Janet Fowler,  Thank you for taking the time for your Medicare Wellness Visit. I appreciate your continued commitment to your health goals. Please review the care plan we discussed, and feel free to reach out if I can assist you further.  Please note that Annual Wellness Visits do not include a physical exam. Some assessments may be limited, especially if the visit was conducted virtually. If needed, we may recommend an in-person follow-up with your provider.  Ongoing Care Seeing your primary care provider every 3 to 6 months helps us  monitor your health and provide consistent, personalized care.   Referrals If a referral was made during today's visit and you haven't received any updates within two weeks, please contact the referred provider directly to check on the status.  Recommended Screenings:  Health Maintenance  Topic Date Due   Hepatitis C Screening  Never done   Pneumococcal Vaccine for age over 17 (1 of 1 - PCV) Never done   COVID-19 Vaccine (3 - Pfizer risk series) 12/30/2019   Medicare Annual Wellness Visit  01/04/2024   Flu Shot  05/02/2024   Breast Cancer Screening  10/22/2024   Zoster (Shingles) Vaccine (1 of 2) 11/13/2024*   Osteoporosis screening with Bone Density Scan  07/02/2026   Colon Cancer Screening  10/04/2030   DTaP/Tdap/Td vaccine (2 - Td or Tdap) 09/19/2033   Meningitis B Vaccine  Aged Out  *Topic was postponed. The date shown is not the original due date.       10/21/2024    2:55 PM  Advanced Directives  Does Patient Have a Medical Advance Directive? No  Would patient like information on creating a medical advance directive? No - Patient declined    Vision: Annual vision screenings are recommended for early detection of glaucoma, cataracts, and diabetic retinopathy. These exams can also reveal signs of chronic conditions such as diabetes and high blood pressure.  Dental: Annual dental screenings help detect early signs of oral cancer, gum disease, and  other conditions linked to overall health, including heart disease and diabetes.  Please see the attached documents for additional preventive care recommendations.

## 2024-10-23 ENCOUNTER — Telehealth: Payer: Self-pay | Admitting: Internal Medicine

## 2024-10-23 NOTE — Telephone Encounter (Signed)
-----   Message from Barnie Louder, MD sent at 10/22/2024  8:52 AM EST ----- Regarding: Appt Due for routine f/u with me in Feb 2026. Please schedule.

## 2024-10-23 NOTE — Telephone Encounter (Signed)
 Contacted patient, Appointment scheduled.

## 2024-10-24 ENCOUNTER — Ambulatory Visit: Admission: RE | Admit: 2024-10-24 | Source: Ambulatory Visit

## 2024-10-24 DIAGNOSIS — D0511 Intraductal carcinoma in situ of right breast: Secondary | ICD-10-CM

## 2024-11-21 ENCOUNTER — Ambulatory Visit: Payer: Self-pay | Admitting: Internal Medicine

## 2025-01-14 ENCOUNTER — Inpatient Hospital Stay: Admitting: Hematology

## 2025-01-14 ENCOUNTER — Inpatient Hospital Stay
